# Patient Record
Sex: Male | Born: 1975 | Hispanic: No | Marital: Single | Smoking: Former smoker
Health system: Southern US, Community
[De-identification: ages and names within clinical notes are randomized; demographics above are authoritative.]

## PROBLEM LIST (undated history)

## (undated) DIAGNOSIS — C801 Malignant (primary) neoplasm, unspecified: Secondary | ICD-10-CM

---

## 2008-05-07 ENCOUNTER — Emergency Department (HOSPITAL_COMMUNITY): Admission: EM | Admit: 2008-05-07 | Discharge: 2008-05-07 | Payer: Self-pay | Admitting: Emergency Medicine

## 2011-06-02 LAB — RAPID URINE DRUG SCREEN, HOSP PERFORMED
Amphetamines: NOT DETECTED
Benzodiazepines: NOT DETECTED
Cocaine: NOT DETECTED
Opiates: NOT DETECTED
Tetrahydrocannabinol: NOT DETECTED

## 2011-06-02 LAB — URINALYSIS, ROUTINE W REFLEX MICROSCOPIC
Bilirubin Urine: NEGATIVE
Glucose, UA: NEGATIVE
Hgb urine dipstick: NEGATIVE
Ketones, ur: NEGATIVE
Nitrite: NEGATIVE
Specific Gravity, Urine: 1.004 — ABNORMAL LOW
pH: 6

## 2011-06-02 LAB — ETHANOL
Alcohol, Ethyl (B): 412
Alcohol, Ethyl (B): 99 — ABNORMAL HIGH

## 2011-06-02 LAB — COMPREHENSIVE METABOLIC PANEL
ALT: 26
Albumin: 4.1
Alkaline Phosphatase: 89
Calcium: 8.6
GFR calc Af Amer: >60
Glucose, Bld: 128 — ABNORMAL HIGH
Potassium: 3.8
Sodium: 140
Total Protein: 7.4

## 2011-06-02 LAB — CBC
MCHC: 34
Platelets: 141 — ABNORMAL LOW
RDW: 13

## 2011-06-02 LAB — DIFFERENTIAL
Basophils Relative: 0
Eosinophils Absolute: 0.1
Lymphs Abs: 1.4
Monocytes Absolute: 0.4
Monocytes Relative: 6
Neutro Abs: 5.2
Neutrophils Relative %: 73

## 2016-01-25 ENCOUNTER — Emergency Department (HOSPITAL_COMMUNITY)
Admission: EM | Admit: 2016-01-25 | Discharge: 2016-01-26 | Disposition: A | Discharge status: Home / Self Care | Payer: Self-pay | Attending: Emergency Medicine | Admitting: Emergency Medicine

## 2016-01-25 ENCOUNTER — Emergency Department (HOSPITAL_COMMUNITY): Payer: Self-pay

## 2016-01-25 ENCOUNTER — Encounter (HOSPITAL_COMMUNITY): Payer: Self-pay

## 2016-01-25 DIAGNOSIS — F10920 Alcohol use, unspecified with intoxication, uncomplicated: Secondary | ICD-10-CM

## 2016-01-25 DIAGNOSIS — Y998 Other external cause status: Secondary | ICD-10-CM | POA: Insufficient documentation

## 2016-01-25 DIAGNOSIS — Y92410 Unspecified street and highway as the place of occurrence of the external cause: Secondary | ICD-10-CM | POA: Insufficient documentation

## 2016-01-25 DIAGNOSIS — F10120 Alcohol abuse with intoxication, uncomplicated: Secondary | ICD-10-CM | POA: Insufficient documentation

## 2016-01-25 DIAGNOSIS — Y9389 Activity, other specified: Secondary | ICD-10-CM | POA: Insufficient documentation

## 2016-01-25 DIAGNOSIS — S3992XA Unspecified injury of lower back, initial encounter: Secondary | ICD-10-CM | POA: Insufficient documentation

## 2016-01-25 LAB — I-STAT CHEM 8, ED
BUN: 6 mg/dL (ref 6–20)
CREATININE: 0.9 mg/dL (ref 0.61–1.24)
Calcium, Ion: 1.08 mmol/L — ABNORMAL LOW (ref 1.12–1.23)
Chloride: 107 mmol/L (ref 101–111)
Glucose, Bld: 96 mg/dL (ref 65–99)
HEMATOCRIT: 43 % (ref 39.0–52.0)
Hemoglobin: 14.6 g/dL (ref 13.0–17.0)
POTASSIUM: 3.7 mmol/L (ref 3.5–5.1)
Sodium: 147 mmol/L — ABNORMAL HIGH (ref 135–145)
TCO2: 26 mmol/L (ref 0–100)
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
## 2016-01-25 NOTE — ED Notes (Signed)
Pt calling out from room, cursing. RN arrived to room and pt was urinating on floor. Pt removed all vital monitoring devices and c-collar. RN attempted to replace c-collar, pt continued to refuse. Pt states "I'm trying to go to sleep. It hurts too bad." RN again attempted to replace collar, pt continues to refuse.

## 2016-01-26 NOTE — ED Notes (Signed)
Patient still difficult to arouse.  Sleeping in bed but able to wake to verbal stimuli.  Will continue to monitor

## 2016-01-26 NOTE — Discharge Instructions (Signed)
Alcohol Abuse and Nutrition Alcohol abuse is any pattern of alcohol consumption that harms your health, relationships, or work. Alcohol abuse can affect how your body breaks down and absorbs nutrients from food by causing your liver to work abnormally. Additionally, many people who abuse alcohol do not eat enough carbohydrates, protein, fat, vitamins, and minerals. This can cause poor nutrition (malnutrition) and a lack of nutrients (nutrient deficiencies), which can lead to further complications. Nutrients that are commonly lacking (deficient) among people who abuse alcohol include:  Vitamins.  Vitamin A. This is stored in your liver. It is important for your vision, metabolism, and ability to fight off infections (immunity).  B vitamins. These include vitamins such as folate, thiamin, and niacin. These are important in new cell growth and maintenance.  Vitamin C. This plays an important role in iron absorption, wound healing, and immunity.  Vitamin D. This is produced by your liver, but you can also get vitamin D from food. Vitamin D is necessary for your body to absorb and use calcium.  Minerals.  Calcium. This is important for your bones and your heart and blood vessel (cardiovascular) function.  Iron. This is important for blood, muscle, and nervous system functioning.  Magnesium. This plays an important role in muscle and nerve function, and it helps to control blood sugar and blood pressure.  Zinc. This is important for the normal function of your nervous system and digestive system (gastrointestinal tract). Nutrition is an essential component of therapy for alcohol abuse. Your health care provider or dietitian will work with you to design a plan that can help restore nutrients to your body and prevent potential complications. WHAT IS MY PLAN? Your dietitian may develop a specific diet plan that is based on your condition and any other complications you may have. A diet plan will  commonly include:  A balanced diet.  Grains: 6-8 oz per day.  Vegetables: 2-3 cups per day.  Fruits: 1-2 cups per day.  Meat and other protein: 5-6 oz per day.  Dairy: 2-3 cups per day.  Vitamin and mineral supplements. WHAT DO I NEED TO KNOW ABOUT ALCOHOL AND NUTRITION?  Consume foods that are high in antioxidants, such as grapes, berries, nuts, green tea, and dark green and orange vegetables. This can help to counteract some of the stress that is placed on your liver by consuming alcohol.  Avoid food and drinks that are high in fat and sugar. Foods such as sugared soft drinks, salty snack foods, and candy contain empty calories. This means that they lack important nutrients such as protein, fiber, and vitamins.  Eat frequent meals and snacks. Try to eat 5-6 small meals each day.  Eat a variety of fresh fruits and vegetables each day. This will help you get plenty of water, fiber, and vitamins in your diet.  Drink plenty of water and other clear fluids. Try to drink at least 48-64 oz (1.5-2 L) of water per day.  If you are a vegetarian, eat a variety of protein-rich foods. Pair whole grains with plant-based proteins at meals and snacks to obtain the greatest nutrient benefit from your food. For example, eat rice with beans, put peanut butter on whole-grain toast, or eat oatmeal with sunflower seeds.  Soak beans and whole grains overnight before cooking. This can help your body to absorb the nutrients more easily.  Include foods fortified with vitamins and minerals in your diet. Commonly fortified foods include milk, orange juice, cereal, and bread.  If you  are malnourished, your dietitian may recommend a high-protein, high-calorie diet. This may include:  2,000-3,000 calories (kilocalories) per day.  70-100 grams of protein per day.  Your health care provider may recommend a complete nutritional supplement beverage. This can help to restore calories, protein, and vitamins to  your body. Depending on your condition, you may be advised to consume this instead of or in addition to meals.  Limit your intake of caffeine. Replace drinks like coffee and black tea with decaffeinated coffee and herbal tea.  Eat a variety of foods that are high in omega fatty acids. These include fish, nuts and seeds, and soybeans. These foods may help your liver to recover and may also stabilize your mood.  Certain medicines may cause changes in your appetite, taste, and weight. Work with your health care provider and dietitian to make any adjustments to your medicines and diet plan.  Include other healthy lifestyle choices in your daily routine.  Be physically active.  Get enough sleep.  Spend time doing activities that you enjoy.  If you are unable to take in enough food and calories by mouth, your health care provider may recommend a feeding tube. This is a tube that passes through your nose and throat, directly into your stomach. Nutritional supplement beverages can be given to you through the feeding tube to help you get the nutrients you need.  Take vitamin or mineral supplements as recommended by your health care provider. WHAT FOODS CAN I EAT? Grains Enriched pasta. Enriched rice. Fortified whole-grain bread. Fortified whole-grain cereal. Barley. Brown rice. Quinoa. [place]. Vegetables All fresh, frozen, and canned vegetables. Spinach. Kale. Artichoke. Carrots. Winter squash and pumpkin. Sweet potatoes. Broccoli. Cabbage. Cucumbers. Tomatoes. Sweet peppers. Green beans. Peas. Corn. Fruits All fresh and frozen fruits. Berries. Grapes. Mango. Papaya. Guava. Cherries. Apples. Bananas. Peaches. Plums. Pineapple. Watermelon. Cantaloupe. Oranges. Avocado. Meats and Other Protein Sources Beef liver. Lean beef. Pork. Fresh and canned chicken. Fresh fish. Oysters. Sardines. Canned tuna. Shrimp. Eggs with yolks. Nuts and seeds. Peanut butter. Beans and lentils. Soybeans.  Tofu. Dairy Whole, low-fat, and nonfat milk. Whole, low-fat, and nonfat yogurt. Cottage cheese. Sour cream. Hard and soft cheeses. Beverages Water. Herbal tea. Decaffeinated coffee. Decaffeinated green tea. 100% fruit juice. 100% vegetable juice. Instant breakfast shakes. Condiments Ketchup. Mayonnaise. Mustard. Salad dressing. Barbecue sauce. Sweets and Desserts Sugar-free ice cream. Sugar-free pudding. Sugar-free gelatin. Fats and Oils Butter. Vegetable oil, flaxseed oil, olive oil, and walnut oil. Other Complete nutrition shakes. Protein bars. Sugar-free gum. The items listed above may not be a complete list of recommended foods or beverages. Contact your dietitian for more options. WHAT FOODS ARE NOT RECOMMENDED? Grains Sugar-sweetened breakfast cereals. Flavored instant oatmeal. Fried breads. Vegetables Breaded or deep-fried vegetables. Fruits Dried fruit with added sugar. Candied fruit. Canned fruit in syrup. Meats and Other Protein Sources Breaded or deep-fried meats. Dairy Flavored milks. Fried cheese curds or fried cheese sticks. Beverages Alcohol. Sugar-sweetened soft drinks. Sugar-sweetened tea. Caffeinated coffee and tea. Condiments Sugar. Honey. Agave nectar. Molasses. Sweets and Desserts Chocolate. Cake. Cookies. Candy. Other Potato chips. Pretzels. Salted nuts. Candied nuts. The items listed above may not be a complete list of foods and beverages to avoid. Contact your dietitian for more information.   This information is not intended to replace advice given to you by your health care provider. Make sure you discuss any questions you have with your health care provider.   Document Released: 06/10/2005 Document Revised: 09/06/2014 Document Reviewed: 03/19/2014 Elsevier Interactive Patient  Education 2016 [place] Dependency Chemical dependency is an addiction to drugs or alcohol. It is characterized by the repeated behavior of seeking out and  using drugs and alcohol despite harmful consequences to the health and safety of ones self and others.  RISK FACTORS There are certain situations or behaviors that increase a person's risk for chemical dependency. These include:  A family history of chemical dependency.  A history of mental health issues, including depression and anxiety.  A home environment where drugs and alcohol are easily available to you.  Drug or alcohol use at a young age. SYMPTOMS  The following symptoms can indicate chemical dependency:  Inability to limit the use of drugs or alcohol.  Nausea, sweating, shakiness, and anxiety that occurs when alcohol or drugs are not being used.  An increase in amount of drugs or alcohol that is necessary to get drunk or high. People who experience these symptoms can assess their use of drugs and alcohol by asking themselves the following questions:  Have you been told by friends or family that they are worried about your use of alcohol or drugs?  Do friends and family ever tell you about things you did while drinking alcohol or using drugs that you do not remember?  Do you lie about using alcohol or drugs or about the amounts you use?  Do you have difficulty completing daily tasks unless you use alcohol or drugs?  Is the level of your work or school performance lower because of your drug or alcohol use?  Do you get sick from using drugs or alcohol but keep using anyway?  Do you feel uncomfortable in social situations unless you use alcohol or drugs?  Do you use drugs or alcohol to help forget problems? An answer of yes to any of these questions may indicate chemical dependency. Professional evaluation is suggested.   This information is not intended to replace advice given to you by your health care provider. Make sure you discuss any questions you have with your health care provider.   Document Released: 08/10/2001 Document Revised: 11/08/2011 Document Reviewed:  10/22/2010 Elsevier Interactive Patient Education 2016 [employer].  Alcohol Use Disorder Alcohol use disorder is a mental disorder. It is not a one-time incident of heavy drinking. Alcohol use disorder is the excessive and uncontrollable use of alcohol over time that leads to problems with functioning in one or more areas of daily living. People with this disorder risk harming themselves and others when they drink to excess. Alcohol use disorder also can cause other mental disorders, such as mood and anxiety disorders, and serious physical problems. People with alcohol use disorder often misuse other drugs.  Alcohol use disorder is common and widespread. Some people with this disorder drink alcohol to cope with or escape from negative life events. Others drink to relieve chronic pain or symptoms of mental illness. People with a family history of alcohol use disorder are at higher risk of losing control and using alcohol to excess.  Drinking too much alcohol can cause injury, accidents, and health problems. One drink can be too much when you are:  Working.  Pregnant or breastfeeding.  Taking medicines. Ask your doctor.  Driving or planning to drive. SYMPTOMS  Signs and symptoms of alcohol use disorder may include the following:   Consumption ofalcohol inlarger amounts or over a longer period of time than intended.  Multiple unsuccessful attempts to cutdown or control alcohol use.   A great deal of time  spent obtaining alcohol, using alcohol, or recovering from the effects of alcohol (hangover).  A Malizia desire or urge to use alcohol (cravings).   Continued use of alcohol despite problems at work, school, or home because of alcohol use.   Continued use of alcohol despite problems in relationships because of alcohol use.  Continued use of alcohol in situations when it is physically hazardous, such as driving a car.  Continued use of alcohol despite awareness of a physical or  psychological problem that is likely related to alcohol use. Physical problems related to alcohol use can involve the brain, heart, liver, stomach, and intestines. Psychological problems related to alcohol use include intoxication, depression, anxiety, psychosis, delirium, and dementia.   The need for increased amounts of alcohol to achieve the same desired effect, or a decreased effect from the consumption of the same amount of alcohol (tolerance).  Withdrawal symptoms upon reducing or stopping alcohol use, or alcohol use to reduce or avoid withdrawal symptoms. Withdrawal symptoms include:  Racing heart.  Hand tremor.  Difficulty sleeping.  Nausea.  Vomiting.  Hallucinations.  Restlessness.  Seizures. DIAGNOSIS Alcohol use disorder is diagnosed through an assessment by your health care provider. Your health care provider may start by asking three or four questions to screen for excessive or problematic alcohol use. To confirm a diagnosis of alcohol use disorder, at least two symptoms must be present within a 30-month period. The severity of alcohol use disorder depends on the number of symptoms:  Mild--two or three.  Moderate--four or five.  Severe--six or more. Your health care provider may perform a physical exam or use results from lab tests to see if you have physical problems resulting from alcohol use. Your health care provider may refer you to a mental health professional for evaluation. TREATMENT  Some people with alcohol use disorder are able to reduce their alcohol use to low-risk levels. Some people with alcohol use disorder need to quit drinking alcohol. When necessary, mental health professionals with specialized training in substance use treatment can help. Your health care provider can help you decide how severe your alcohol use disorder is and what type of treatment you need. The following forms of treatment are available:   Detoxification. Detoxification involves  the use of prescription medicines to prevent alcohol withdrawal symptoms in the first week after quitting. This is important for people with a history of symptoms of withdrawal and for heavy drinkers who are likely to have withdrawal symptoms. Alcohol withdrawal can be dangerous and, in severe cases, cause death. Detoxification is usually provided in a hospital or in-patient substance use treatment facility.  Counseling or talk therapy. Talk therapy is provided by substance use treatment counselors. It addresses the reasons people use alcohol and ways to keep them from drinking again. The goals of talk therapy are to help people with alcohol use disorder find healthy activities and ways to cope with life stress, to identify and avoid triggers for alcohol use, and to handle cravings, which can cause relapse.  Medicines.Different medicines can help treat alcohol use disorder through the following actions:  Decrease alcohol cravings.  Decrease the positive reward response felt from alcohol use.  Produce an uncomfortable physical reaction when alcohol is used (aversion therapy).  Support groups. Support groups are run by people who have quit drinking. They provide emotional support, advice, and guidance. These forms of treatment are often combined. Some people with alcohol use disorder benefit from intensive combination treatment provided by specialized substance use treatment centers.  Both inpatient and outpatient treatment programs are available.   This information is not intended to replace advice given to you by your health care provider. Make sure you discuss any questions you have with your health care provider.   Document Released: 09/23/2004 Document Revised: 09/06/2014 Document Reviewed: 11/23/2012 Elsevier Interactive Patient Education 2016 [employer]. Technical brewer It is common to have multiple bruises and sore muscles after a motor vehicle collision (MVC). These tend to feel  worse for the first 24 hours. You may have the most stiffness and soreness over the first several hours. You may also feel worse when you wake up the first morning after your collision. After this point, you will usually begin to improve with each day. The speed of improvement often depends on the severity of the collision, the number of injuries, and the location and nature of these injuries. HOME CARE INSTRUCTIONS  Put ice on the injured area.  Put ice in a plastic bag.  Place a towel between your skin and the bag.  Leave the ice on for 15-20 minutes, 3-4 times a day, or as directed by your health care provider.  Drink enough fluids to keep your urine clear or pale yellow. Do not drink alcohol.  Take a warm shower or bath once or twice a day. This will increase blood flow to sore muscles.  You may return to activities as directed by your caregiver. Be careful when lifting, as this may aggravate neck or back pain.  Only take over-the-counter or prescription medicines for pain, discomfort, or fever as directed by your caregiver. Do not use aspirin. This may increase bruising and bleeding. SEEK IMMEDIATE MEDICAL CARE IF:  You have numbness, tingling, or weakness in the arms or legs.  You develop severe headaches not relieved with medicine.  You have severe neck pain, especially tenderness in the middle of the back of your neck.  You have changes in bowel or bladder control.  There is increasing pain in any area of the body.  You have shortness of breath, light-headedness, dizziness, or fainting.  You have chest pain.  You feel sick to your stomach (nauseous), throw up (vomit), or sweat.  You have increasing abdominal discomfort.  There is blood in your urine, stool, or vomit.  You have pain in your shoulder (shoulder strap areas).  You feel your symptoms are getting worse. MAKE SURE YOU:  Understand these instructions.  Will watch your condition.  Will get help right  away if you are not doing well or get worse.   This information is not intended to replace advice given to you by your health care provider. Make sure you discuss any questions you have with your health care provider.   Document Released: 08/16/2005 Document Revised: 09/06/2014 Document Reviewed: 01/13/2011 Elsevier Interactive Patient Education [employer].

## 2016-01-26 NOTE — ED Notes (Signed)
This RN woke up patient to get him ready for discharge. Pt able to sit up, stand on feet steadily and ambulated to wheelchair steadily and independently. Pt was able to answer questions appropriately and clearly. Pt wheeled to waiting room to call for a ride home. Pt A&OX4.
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
## 2019-05-08 ENCOUNTER — Emergency Department (HOSPITAL_COMMUNITY)
Admission: EM | Admit: 2019-05-08 | Discharge: 2019-05-08 | Disposition: A | Discharge status: Home / Self Care | Payer: Self-pay | Attending: Emergency Medicine | Admitting: Emergency Medicine

## 2019-05-08 ENCOUNTER — Emergency Department (HOSPITAL_COMMUNITY): Payer: Self-pay

## 2019-05-08 ENCOUNTER — Other Ambulatory Visit: Payer: Self-pay

## 2019-05-08 ENCOUNTER — Encounter (HOSPITAL_COMMUNITY): Payer: Self-pay | Admitting: Emergency Medicine

## 2019-05-08 ENCOUNTER — Emergency Department (HOSPITAL_COMMUNITY)
Admission: EM | Admit: 2019-05-08 | Discharge: 2019-05-09 | Disposition: A | Discharge status: Home / Self Care | Payer: Self-pay | Attending: Emergency Medicine | Admitting: Emergency Medicine

## 2019-05-08 DIAGNOSIS — F10929 Alcohol use, unspecified with intoxication, unspecified: Secondary | ICD-10-CM | POA: Insufficient documentation

## 2019-05-08 DIAGNOSIS — F17210 Nicotine dependence, cigarettes, uncomplicated: Secondary | ICD-10-CM | POA: Insufficient documentation

## 2019-05-08 DIAGNOSIS — F10920 Alcohol use, unspecified with intoxication, uncomplicated: Secondary | ICD-10-CM

## 2019-05-08 DIAGNOSIS — R4182 Altered mental status, unspecified: Secondary | ICD-10-CM | POA: Insufficient documentation

## 2019-05-08 DIAGNOSIS — R110 Nausea: Secondary | ICD-10-CM | POA: Insufficient documentation

## 2019-05-08 MED ORDER — ONDANSETRON 8 MG PO TBDP: ORAL_TABLET | ORAL | 0 refills | Status: DC

## 2019-05-08 MED ORDER — ONDANSETRON 8 MG PO TBDP
8.0000 mg | ORAL_TABLET | Freq: Once | ORAL | Status: AC
  Administered 2019-05-08: 8 mg via ORAL
  Filled 2019-05-08: qty 1

## 2019-05-08 NOTE — ED Notes (Signed)
Pt refusing to allow RN to obtain blood for labs. Pearlie Oyster, PA aware.

## 2019-05-08 NOTE — ED Provider Notes (Signed)
[facility] DEPT Provider Note   CSN: [id number] Arrival date & time: 05/08/19  0219     History   Chief Complaint Chief Complaint  Patient presents with  . Nausea    HPI Gregory Snyder is a 43 y.o. male.     The history is provided by the patient.  Illness Location:  Started at home after eating a bologna sandwich Quality:  Nauseated Severity:  Moderate Onset quality:  Sudden Duration:  2 hours Timing:  Constant Progression:  Unchanged Chronicity:  New Context:  Eating a bologna sandwich Relieved by:  Nothing Worsened by:  Nothing Ineffective treatments:  None tried Associated symptoms: nausea   Associated symptoms: no abdominal pain, no chest pain, no congestion, no cough, no diarrhea, no ear pain, no fatigue, no fever, no headaches, no loss of consciousness, no myalgias, no rash, no rhinorrhea, no shortness of breath, no sore throat, no vomiting and no wheezing   Nausea:    Severity:  Moderate   Onset quality:  Sudden   Duration:  2 hours   Timing:  Constant   Progression:  Unchanged   History reviewed. No pertinent past medical history.  There are no active problems to display for this patient.   History reviewed. No pertinent surgical history.      Home Medications    Prior to Admission medications   Medication Sig Start Date End Date Taking? Authorizing Provider  ondansetron (ZOFRAN ODT) 8 MG disintegrating tablet 8mg  ODT q8 hours prn nausea 05/08/19   Goro Wenrick, MD    Family History History reviewed. No pertinent family history.  Social History Social History   Tobacco Use  . Smoking status: Heavy Tobacco Smoker    Packs/day: 1.00    Types: Cigarettes  . Smokeless tobacco: Never Used  Substance Use Topics  . Alcohol use: Yes  . Drug use: No     Allergies   Ibuprofen   Review of Systems Review of Systems  Constitutional: Negative for fatigue and fever.  HENT: Negative for congestion, ear pain,  rhinorrhea and sore throat.   Eyes: Negative for visual disturbance.  Respiratory: Negative for cough, shortness of breath and wheezing.   Cardiovascular: Negative for chest pain.  Gastrointestinal: Positive for nausea. Negative for abdominal pain, constipation, diarrhea and vomiting.  Genitourinary: Negative for dysuria and flank pain.  Musculoskeletal: Negative for myalgias.  Skin: Negative for rash.  Neurological: Negative for loss of consciousness and headaches.  Psychiatric/Behavioral: Negative for agitation.  All other systems reviewed and are negative.    Physical Exam Updated Vital Signs BP (!) 142/96 (BP Location: Right Arm)   Pulse 77   Temp 98.4 F (36.9 C) (Oral)   Resp 18   Ht 5\' 9"  (1.753 m)   Wt 90.7 kg   SpO2 100%   BMI 29.53 kg/m   Physical Exam Vitals signs and nursing note reviewed.  Constitutional:      General: He is not in acute distress.    Appearance: He is normal weight.  HENT:     Head: Normocephalic and atraumatic.     Nose: Nose normal.     Mouth/Throat:     Mouth: Mucous membranes are moist.     Pharynx: Oropharynx is clear.  Eyes:     Conjunctiva/sclera: Conjunctivae normal.     Pupils: Pupils are equal, round, and reactive to light.  Neck:     Musculoskeletal: Normal range of motion and neck supple.  Cardiovascular:  Rate and Rhythm: Normal rate and regular rhythm.     Pulses: Normal pulses.     Heart sounds: Normal heart sounds.  Pulmonary:     Effort: Pulmonary effort is normal.     Breath sounds: Normal breath sounds.  Abdominal:     General: Abdomen is flat. Bowel sounds are normal.     Tenderness: There is no abdominal tenderness. There is no guarding or rebound.  Musculoskeletal: Normal range of motion.  Skin:    General: Skin is warm.     Capillary Refill: Capillary refill takes less than 2 seconds.  Neurological:     General: No focal deficit present.     Mental Status: He is alert and oriented to person, place, and  time.  Psychiatric:        Mood and Affect: Mood normal.        Behavior: Behavior normal.      ED Treatments / Results  Labs (all labs ordered are listed, but only abnormal results are displayed) Labs Reviewed - No data to display  EKG None  Radiology No results found.  Procedures Procedures (including critical care time)  Medications Ordered in ED Medications  ondansetron (ZOFRAN-ODT) disintegrating tablet 8 mg (8 mg Oral Given 05/08/19 0356)     Symptoms resolved post medication.  PO challenged successfully.  No f/c/r. No abdominal pain nor tenderness. Suspect sandwich as the cause of symptoms.  Counseled on eating a bland diet.  No indication for labs or imaging at this time.   Gregory Snyder was evaluated in Emergency Department on 05/08/2019 for the symptoms described in the history of present illness. He was evaluated in the context of the global COVID-19 pandemic, which necessitated consideration that the patient might be at risk for infection with the SARS-CoV-2 virus that causes COVID-19. Institutional protocols and algorithms that pertain to the evaluation of patients at risk for COVID-19 are in a state of rapid change based on information released by regulatory bodies including the CDC and federal and state organizations. These policies and algorithms were followed during the patient's care in the ED.   Final Clinical Impressions(s) / ED Diagnoses   Final diagnoses:  Nausea   Return for intractable cough, coughing up blood,fevers >100.4 unrelieved by medication, shortness of breath, intractable vomiting, chest pain, shortness of breath, weakness,numbness, changes in speech, facial asymmetry,abdominal pain, passing out,Inability to tolerate liquids or food, cough, altered mental status or any concerns. No signs of systemic illness or infection. The patient is nontoxic-appearing on exam and vital signs are within normal limits.   I have reviewed the triage vital  signs and the nursing notes. Pertinent labs &imaging results that were available during my care of the patient were reviewed by me and considered in my medical decision making (see chart for details). After history, exam, and medical workup I feel the patient has beenappropriately medically screened and is safe for discharge home. Pertinent diagnoses were discussed with the patient. Patient was givenreturn precautions ED Discharge Orders         Ordered    ondansetron (ZOFRAN ODT) 8 MG disintegrating tablet     05/08/19 0416           Judieth Mckown, MD 05/08/19 [id number]

## 2019-05-08 NOTE — ED Triage Notes (Signed)
Pt presents by [facility] for evaluation of nausea without vomiting or diarrhea that started at 8pm on 05/07/2019.

## 2019-05-08 NOTE — ED Triage Notes (Signed)
Pt BIB EMS from [place]. Pt intoxicated. Pt has no complaints at this time.

## 2019-05-08 NOTE — ED Notes (Signed)
Pt would not cooperate to have CT scan

## 2019-05-08 NOTE — ED Provider Notes (Signed)
[facility] DEPT Provider Note   CSN: [id number] Arrival date & time: 05/08/19  2115     History   Chief Complaint Chief Complaint  Patient presents with  . Alcohol Intoxication    HPI Gregory Snyder is a 43 y.o. male.     The history is provided by medical records. The history is limited by the condition of the patient. No language interpreter was used.  Alcohol Intoxication   Gregory Snyder is a 43 y.o. male  who presents to the Emergency Department brought in by EMS from downtown [place] reportedly intoxicated.  I tried to evaluate patient, but he just kept rolling over and putting a blanket over his head and would not talk to me.  Unable to gather any history from patient at this time.  Level V caveat applies 2/2 altered mental status   No past medical history on file.  There are no active problems to display for this patient.   No past surgical history on file.      Home Medications    Prior to Admission medications   Medication Sig Start Date End Date Taking? Authorizing Provider  ondansetron (ZOFRAN ODT) 8 MG disintegrating tablet 8mg  ODT q8 hours prn nausea 05/08/19   Palumbo, April, MD    Family History No family history on file.  Social History Social History   Tobacco Use  . Smoking status: Heavy Tobacco Smoker    Packs/day: 1.00    Types: Cigarettes  . Smokeless tobacco: Never Used  Substance Use Topics  . Alcohol use: Yes  . Drug use: No     Allergies   Ibuprofen   Review of Systems Review of Systems  Unable to perform ROS: Mental status change     Physical Exam Updated Vital Signs BP (!) 119/55   Pulse 70   Resp 15   SpO2 95%   Physical Exam Vitals signs and nursing note reviewed.  Constitutional:      General: He is not in acute distress.    Appearance: He is well-developed.  HENT:     Head: Normocephalic and atraumatic.  Neck:     Musculoskeletal: Neck supple.  Cardiovascular:     Rate  and Rhythm: Normal rate and regular rhythm.     Heart sounds: Normal heart sounds. No murmur.  Pulmonary:     Effort: Pulmonary effort is normal. No respiratory distress.     Breath sounds: Normal breath sounds.  Abdominal:     General: There is no distension.     Palpations: Abdomen is soft.     Tenderness: There is no abdominal tenderness.  Skin:    General: Skin is warm and dry.  Neurological:     Mental Status: He is alert.     Comments: Will not cooperate with testing. Moves all extremities independently.       ED Treatments / Results  Labs (all labs ordered are listed, but only abnormal results are displayed) Labs Reviewed  COMPREHENSIVE METABOLIC PANEL  CBC WITH DIFFERENTIAL/PLATELET  ETHANOL  SALICYLATE LEVEL  ACETAMINOPHEN LEVEL    EKG None  Radiology No results found.  Procedures Procedures (including critical care time)  Medications Ordered in ED Medications - No data to display   Initial Impression / Assessment and Plan / ED Course  I have reviewed the triage vital signs and the nursing notes.  Pertinent labs & imaging results that were available during my care of the patient were reviewed by me and considered  in my medical decision making (see chart for details).       [name] is a 43 y.o. male was brought to ED presumably intoxicated after being found in downtown [place]. Patient not contributing to history. CT head negative. Labs pending. Care assumed by oncoming provider, PA Upstill who will follow up on labs and dispo appropriately.    Final Clinical Impressions(s) / ED Diagnoses   Final diagnoses:  None    ED Discharge Orders    None       Ward, Ozella Almond, PA-C 05/09/19 [id number]    Veryl Speak, MD 05/11/19 2304

## 2019-05-08 NOTE — ED Notes (Signed)
PT given water for PO challenge.

## 2019-05-08 NOTE — ED Notes (Signed)
ED Provider at bedside. 

## 2019-05-08 NOTE — ED Notes (Signed)
Attempted to move patient to CT table for CT scan. Pt refused to lay on his back or let us move him in any way. Pt taken back to room. PA Jamie aware.

## 2019-05-08 NOTE — ED Notes (Signed)
Pt tolerate PO water. States that he feels much better.

## 2019-05-09 ENCOUNTER — Emergency Department (HOSPITAL_COMMUNITY): Payer: Self-pay

## 2019-05-09 NOTE — ED Notes (Signed)
Pt refusing to allow this writer to update his vital signs.

## 2019-05-09 NOTE — ED Notes (Signed)
Pt refused discharge vital signs

## 2019-05-09 NOTE — ED Provider Notes (Signed)
Patient care signed out by [facility], PA-C at end of shift. Patient presented by EMS from downtown [place] with reported intoxication. History of same. Head CT negative for other concerning causes of altered mental status.   VSS. He refuses to answer questions. Plan is to observe and allow him to sober.   He has been sleeping soundly. Responds to verbal stimuli on rechecks through the night. He continues to refuse blood draw.   6:45 - patient wakes easily, responds politely. He denies SI/HI/AVH or any attempt at self harm last night. He states he will accept resources for alcohol treatment programs. He can be discharged home.      Charlann Lange, PA-C 05/09/19 [id number]    Veryl Speak, MD 05/11/19 [phone]

## 2019-05-09 NOTE — Progress Notes (Addendum)
Patient refusing lab draws. Notified PA

## 2019-05-09 NOTE — ED Notes (Signed)
Pt is refusing blood draw. Pt yanked his arm away from this writer while attempting to drawl blood. Pt stated "That's not a fucking vein. You're not going to get it and I'm not going to let you sit here and dig around in my arm." Nehemiah Settle, PA made aware.

## 2020-12-28 HISTORY — PX: TOTAL LARYNGECTOMY: SHX2543

## 2021-02-20 ENCOUNTER — Other Ambulatory Visit: Payer: Self-pay | Admitting: Radiation Oncology

## 2021-02-20 ENCOUNTER — Ambulatory Visit
Admission: RE | Admit: 2021-02-20 | Discharge: 2021-02-20 | Disposition: A | Discharge status: Home / Self Care | Payer: Self-pay | Source: Ambulatory Visit | Attending: Radiation Oncology | Admitting: Radiation Oncology

## 2021-02-20 DIAGNOSIS — C760 Malignant neoplasm of head, face and neck: Secondary | ICD-10-CM

## 2021-04-29 NOTE — Progress Notes (Deleted)
[name] CONSULT NOTE  Patient Care Team: System, Provider Not In as PCP - General  CHIEF COMPLAINTS/PURPOSE OF CONSULTATION:  ***  ASSESSMENT & PLAN:  No problem-specific Assessment & Plan notes found for this encounter.  No orders of the defined types were placed in this encounter.    HISTORY OF PRESENTING ILLNESS:  Gregory Snyder 45 y.o. 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  Given extracapsular extension and close margins he was treated with adjuvant chemoradiation with cisplatin.  Cycle 1 day 1 was March 04, 2021.  He received 60 Gy years of IMRT and cisplatin dose was reduced to 30 mics per metered squared during week 4. He was followed by Gregory Snyder from [facility]. He appears to have some cancer related pain treated with fentanyl patch and as needed oxycodone, chemo induced nausea and vomiting and some constipation.  Oncology history.  This is a 45 year old man with history of hepatitis C who presents for evaluation of head and neck cancer.  He initially presented with 35-monthhistory of worsening dysphagia and sore throat.  He had a CT neck performed in February 2022 which revealed an irregular soft tissue mass extending centered in the right supraglottic larynx with involvement of right tonsillar pillar epiglottis true vocal cord right thyroid cartilage with extension to the posterior neck strap muscles on the right.  There is also concern for cervical metastatic disease.  He underwent a trach and G-tube placement on October 08, 2020 with intraoperative biopsy.  Final pathology showed invasive squamous cell carcinoma.  He had a PET/CT performed on April 2022 which showed interval enlargement of the known mass with concerns for cervical metastasis.  He was  recommended primary surgery followed by adjuvant CRT.  He is prior tobacco user, 45-pack-year history, quit in 2021.  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  LARYNX (SUPRAGLOTTIS, GLOTTIS, SUBGLOTTIS) 8th Edition - Protocol posted: 08/15/2020 LARYNX (SUPRAGLOTTIS, GLOTTIS, SUBGLOTTIS) - All Specimens SPECIMEN  Procedure Total laryngectomy  TUMOR  Tumor Focality Unifocal  Tumor Site Larynx, glottis  Tumor Subsite True vocal cord  Subglottic Extension Not identified  Transglottic Extension Present  Tumor Laterality Right  Tumor Size Greatest Dimension (Centimeters): 6.0 cm  Histologic Type Squamous cell carcinoma, conventional (keratinizing)  Histologic Grade G2, moderately differentiated  Lymphovascular Invasion Not identified  Perineural Invasion Not identified  MARGINS  Margin Status for Invasive Tumor All margins negative for invasive tumor  Distance from Invasive Tumor to Closest Margin Less than 1 mm  Closest Margin(s) to Invasive Tumor right pharynx  Margin Status for Noninvasive Tumor All margins negative for noninvasive tumor  Distance from Noninvasive Tumor to Closest Margin Less than 1 mm  Closest Margin(s) to Noninvasive Tumor right pharynx  REGIONAL LYMPH NODES  Regional Lymph Node Status Tumor present in regional lymph node(s)  Number of Lymph Nodes with Tumor 6  Laterality of Lymph Node(s) with Tumor Ipsilateral (including midline)  Size of Largest Nodal Metastatic Deposit 1.6 cm  Extranodal Extension (ENE) Present  Distance of ENE from Lymph Node Capsule Greater than 2 mm (macroscopic ENE)  Number of Lymph Nodes Examined 77  PATHOLOGIC STAGE CLASSIFICATION (pTNM, AJCC 8th Edition)  Reporting of pT, pN, and (when applicable) pM categories is based on information available to the pathologist  at the time the report is issued. As per the AJCC (Chapter 1, 8th Ed.) it is the managing physician's responsibility to establish the final pathologic stage  based upon all pertinent information, including but potentially not limited to this pathology report.    [id number]  He is establishing with medical oncology locally for follow-up.    MEDICAL HISTORY:  No past medical history on file.  SURGICAL HISTORY: No past surgical history on file.  SOCIAL HISTORY: Social History   Socioeconomic History   Marital status: Single    Spouse name: Not on file   Number of children: Not on file   Years of education: Not on file   Highest education level: Not on file  Occupational History   Not on file  Tobacco Use   Smoking status: Heavy Smoker    Packs/day: 1.00    Types: Cigarettes   Smokeless tobacco: Never  Substance and Sexual Activity   Alcohol use: Yes   Drug use: No   Sexual activity: Not on file  Other Topics Concern   Not on file  Social History Narrative   Not on file   Social Determinants of Health   Financial Resource Strain: Not on file  Food Insecurity: Not on file  Transportation Needs: Not on file  Physical Activity: Not on file  Stress: Not on file  Social Connections: Not on file  Intimate Partner Violence: Not on file    FAMILY HISTORY: No family history on file.  ALLERGIES:  is allergic to ibuprofen.  MEDICATIONS:  Current Outpatient Medications  Medication Sig Dispense Refill   ondansetron (ZOFRAN ODT) 8 MG disintegrating tablet '8mg'$  ODT q8 hours prn nausea 4 tablet 0   No current facility-administered medications for this visit.   PHYSICAL EXAMINATION: ECOG PERFORMANCE STATUS: {CHL ONC ECOG PS:[phone]}  There were no vitals filed for this visit. There were no vitals filed for this visit.  GENERAL:alert, no distress and comfortable SKIN: skin color, texture, turgor are normal, no rashes or significant lesions EYES: normal, conjunctiva are pink and non-injected, sclera clear OROPHARYNX:no exudate, no erythema and lips, buccal mucosa, and tongue normal  NECK: supple, thyroid normal size,  non-tender, without nodularity LYMPH:  no palpable lymphadenopathy in the cervical, axillary or inguinal LUNGS: clear to auscultation and percussion with normal breathing effort HEART: regular rate & rhythm and no murmurs and no lower extremity edema ABDOMEN:abdomen soft, non-tender and normal bowel sounds Musculoskeletal:no cyanosis of digits and no clubbing  PSYCH: alert & oriented x 3 with fluent speech NEURO: no focal motor/sensory deficits  LABORATORY DATA:  I have reviewed the data as listed Lab Results  Component Value Date   WBC 7.1 05/07/2008   HGB 14.6 01/25/2016   HCT 43.0 01/25/2016   MCV 87.0 05/07/2008   PLT 141 (L) 05/07/2008     Chemistry      Component Value Date/Time   NA 147 (H) 01/25/2016 2355   K 3.7 01/25/2016 2355   CL 107 01/25/2016 2355   CO2 29 05/07/2008 1026   BUN 6 01/25/2016 2355   CREATININE 0.90 01/25/2016 2355      Component Value Date/Time   CALCIUM 8.6 05/07/2008 1026   ALKPHOS 89 05/07/2008 1026   AST 29 05/07/2008 1026   ALT 26 05/07/2008 1026   BILITOT 0.5 05/07/2008 1026       RADIOGRAPHIC STUDIES: I have personally reviewed the radiological images as listed and agreed with the findings in the report. No results found.  All  questions were answered. The patient knows to call the clinic with any problems, questions or concerns. I spent *** minutes in the care of this patient including H and P, review of records, counseling and coordination of care.     Benay Pike, MD 04/29/2021 1:15 PM

## 2021-04-30 ENCOUNTER — Inpatient Hospital Stay: Payer: Medicaid Other | Admitting: Hematology and Oncology

## 2021-04-30 ENCOUNTER — Telehealth: Payer: Self-pay | Admitting: Hematology and Oncology

## 2021-04-30 ENCOUNTER — Inpatient Hospital Stay: Payer: Medicaid Other

## 2021-04-30 NOTE — Telephone Encounter (Signed)
R/s new patient appt due to patient missing appt. Called, not able to leave msg. Will attempt to call again

## 2021-05-12 ENCOUNTER — Telehealth: Payer: Self-pay

## 2021-05-12 ENCOUNTER — Ambulatory Visit: Payer: Medicaid Other | Admitting: Hematology and Oncology

## 2021-05-12 ENCOUNTER — Other Ambulatory Visit: Payer: Medicaid Other

## 2021-05-12 NOTE — Telephone Encounter (Signed)
Attempted to call patient d/t missing new appointment with Dr Chryl Heck. No answer; unable to leave voicemail.

## 2021-12-28 ENCOUNTER — Emergency Department (HOSPITAL_COMMUNITY)
Admission: EM | Admit: 2021-12-28 | Discharge: 2021-12-28 | Disposition: A | Discharge status: Home / Self Care | Payer: Medicaid Other | Attending: Emergency Medicine | Admitting: Emergency Medicine

## 2021-12-28 DIAGNOSIS — J9503 Malfunction of tracheostomy stoma: Secondary | ICD-10-CM | POA: Insufficient documentation

## 2021-12-28 NOTE — ED Notes (Signed)
Patient to ER room 29 accompanied by law enforcement. Law enforcement reports patient was arrested today and could not be taken to jail d/t missing piece of tracheostomy. Patient reports he "lost" a portion of his trach equipment that involves his trach collar today. Patient reports he has no supplies with him. Patient reports history of laryngeal cancer. Patient is very soft spoken but is alert and oriented and cooperative. Law enforcement remains at bedside. Call bell in reach. ?

## 2021-12-28 NOTE — ED Triage Notes (Signed)
Pt w trach missing since earlier this evening. Airway intact, NAD in triage.  ?

## 2021-12-28 NOTE — ED Notes (Signed)
Physician at bedside and places 8 Shiley trach and collar.  ?

## 2021-12-28 NOTE — ED Provider Notes (Signed)
?[facility] ?Provider Note ? ? ?CSN: [id number] ?Arrival date & time: 12/28/21  2022 ? ?  ? ?History ? ?Chief Complaint  ?Patient presents with  ? Tracheostomy Tube Change  ? ? ?Gregory Snyder is a 46 y.o. male. ? ?46 yo M with a chief complaints of having his tracheostomy tube removed.  He has a history of trach dependence and had typically had a tube in place but took it out today.  He also has recently been arrested.  Is here with police as they were unable to place him into the prison without having his device in place. ? ? ? ?  ? ?Home Medications ?Prior to Admission medications   ?Medication Sig Start Date End Date Taking? Authorizing Provider  ?ondansetron (ZOFRAN ODT) 8 MG disintegrating tablet '8mg'$  ODT q8 hours prn nausea 05/08/19   Palumbo, April, MD  ?   ? ?Allergies    ?Ibuprofen   ? ?Review of Systems   ?Review of Systems ? ?Physical Exam ?Updated Vital Signs ?BP (!) 133/101   Pulse 74   Temp 97.8 ?F (36.6 ?C) (Oral)   Resp 18   SpO2 100%  ?Physical Exam ? ?ED Results / Procedures / Treatments   ?Labs ?(all labs ordered are listed, but only abnormal results are displayed) ?Labs Reviewed - No data to display ? ?EKG ?None ? ?Radiology ?No results found. ? ?Procedures ?TRACHEOSTOMY REPLACEMENT ? ?Date/Time: 12/28/2021 11:18 PM ?Performed by: Deno Etienne, DO ?Authorized by: Deno Etienne, DO  ?Consent: Verbal consent obtained. ?Risks and benefits: risks, benefits and alternatives were discussed ?Consent given by: patient ?Patient understanding: patient does not state understanding of the procedure being performed ?Patient consent: the patient's understanding of the procedure does not match consent given ?Procedure consent: procedure consent does not match procedure scheduled ?Imaging studies: imaging studies not available ?Required items: required blood products, implants, devices, and special equipment available ?Time out: Immediately prior to procedure a "time out" was called  to verify the correct patient, procedure, equipment, support staff and site/side marked as required. ?Indications: became dislodged ?Local anesthesia used: no ? ?Anesthesia: ?Local anesthesia used: no ? ?Sedation: ?Patient sedated: no ? ?Preparation: Patient was prepped and draped in the usual sterile fashion. ?Tube type: single cannula ?Tube cuff: cuffless ?Tube size: 8.0 mm ?Patient tolerance: patient tolerated the procedure well with no immediate complications ? ?  ? ? ?Medications Ordered in ED ?Medications - No data to display ? ?ED Course/ Medical Decision Making/ A&P ?  ?                        ?Medical Decision Making ? ?35 yoM with a chief complaints of having his trach fell out.  This coincidently happened as he was placed under arrest.  The patient has not know what size he has. ? ?On my record review the patient had a 2 [place] Lary tube in place.  Respiratory evaluated the stoma at bedside.  She does not know of this being in the inventory and recommended I discuss it with ENT.  I discussed case with Dr. Janace Hoard, ENT he also agreed that we do not carry that in our hospital system.  After some discussion recommended trying a 8 Shiley. ? ?Placed without issue.  D/c home. ? ?11:19 PM:  I have discussed the diagnosis/risks/treatment options with the patient.  Evaluation and diagnostic testing in the emergency department does not suggest an emergent condition requiring admission or immediate  intervention beyond what has been performed at this time.  They will follow up with  ENT. We also discussed returning to the ED immediately if new or worsening sx occur. We discussed the sx which are most concerning (e.g., sudden worsening pain, fever, inability to tolerate by mouth) that necessitate immediate return. Medications administered to the patient during their visit and any new prescriptions provided to the patient are listed below. ? ?Medications given during this visit ?Medications - No data to display ? ? ?The  patient appears reasonably screen and/or stabilized for discharge and I doubt any other medical condition or other [facility] requiring further screening, evaluation, or treatment in the ED at this time prior to discharge.  ? ? ? ? ? ? ? ? ?Final Clinical Impression(s) / ED Diagnoses ?Final diagnoses:  ?Tracheostomy malfunction ([place])  ? ? ?Rx / DC Orders ?ED Discharge Orders   ? ? None  ? ?  ? ? ?  ?Deno Etienne, DO ?12/28/21 2319 ? ?

## 2021-12-28 NOTE — Discharge Instructions (Addendum)
Follow-up with your ENT in the office to have your tracheostomy site reevaluated.  They may choose to replace your current trach with something else. ? ?You are medically cleared to go to jail  ?

## 2022-01-13 ENCOUNTER — Encounter (HOSPITAL_COMMUNITY): Payer: Self-pay | Admitting: Emergency Medicine

## 2022-01-13 ENCOUNTER — Emergency Department (HOSPITAL_COMMUNITY)

## 2022-01-13 ENCOUNTER — Inpatient Hospital Stay (HOSPITAL_COMMUNITY)
Admission: EM | Admit: 2022-01-13 | Discharge: 2022-01-22 | DRG: 157 | Attending: Infectious Diseases | Admitting: Infectious Diseases

## 2022-01-13 ENCOUNTER — Other Ambulatory Visit: Payer: Self-pay

## 2022-01-13 ENCOUNTER — Observation Stay (HOSPITAL_COMMUNITY)

## 2022-01-13 DIAGNOSIS — L03221 Cellulitis of neck: Secondary | ICD-10-CM

## 2022-01-13 DIAGNOSIS — R221 Localized swelling, mass and lump, neck: Secondary | ICD-10-CM

## 2022-01-13 DIAGNOSIS — B1910 Unspecified viral hepatitis B without hepatic coma: Secondary | ICD-10-CM | POA: Diagnosis present

## 2022-01-13 DIAGNOSIS — R55 Syncope and collapse: Secondary | ICD-10-CM | POA: Diagnosis present

## 2022-01-13 DIAGNOSIS — C140 Malignant neoplasm of pharynx, unspecified: Secondary | ICD-10-CM

## 2022-01-13 DIAGNOSIS — R609 Edema, unspecified: Secondary | ICD-10-CM

## 2022-01-13 DIAGNOSIS — Z9221 Personal history of antineoplastic chemotherapy: Secondary | ICD-10-CM

## 2022-01-13 DIAGNOSIS — R491 Aphonia: Secondary | ICD-10-CM | POA: Diagnosis present

## 2022-01-13 DIAGNOSIS — C7951 Secondary malignant neoplasm of bone: Secondary | ICD-10-CM | POA: Diagnosis present

## 2022-01-13 DIAGNOSIS — R220 Localized swelling, mass and lump, head: Secondary | ICD-10-CM

## 2022-01-13 DIAGNOSIS — Z9002 Acquired absence of larynx: Secondary | ICD-10-CM

## 2022-01-13 DIAGNOSIS — Z8589 Personal history of malignant neoplasm of other organs and systems: Secondary | ICD-10-CM

## 2022-01-13 DIAGNOSIS — Z930 Tracheostomy status: Secondary | ICD-10-CM

## 2022-01-13 DIAGNOSIS — Z923 Personal history of irradiation: Secondary | ICD-10-CM

## 2022-01-13 DIAGNOSIS — R519 Headache, unspecified: Secondary | ICD-10-CM | POA: Diagnosis present

## 2022-01-13 DIAGNOSIS — G629 Polyneuropathy, unspecified: Secondary | ICD-10-CM

## 2022-01-13 DIAGNOSIS — F17210 Nicotine dependence, cigarettes, uncomplicated: Secondary | ICD-10-CM | POA: Diagnosis present

## 2022-01-13 DIAGNOSIS — R252 Cramp and spasm: Secondary | ICD-10-CM | POA: Diagnosis present

## 2022-01-13 DIAGNOSIS — K122 Cellulitis and abscess of mouth: Secondary | ICD-10-CM | POA: Diagnosis not present

## 2022-01-13 DIAGNOSIS — Z886 Allergy status to analgesic agent status: Secondary | ICD-10-CM

## 2022-01-13 DIAGNOSIS — H538 Other visual disturbances: Secondary | ICD-10-CM | POA: Diagnosis present

## 2022-01-13 DIAGNOSIS — K089 Disorder of teeth and supporting structures, unspecified: Secondary | ICD-10-CM | POA: Diagnosis present

## 2022-01-13 DIAGNOSIS — R1310 Dysphagia, unspecified: Secondary | ICD-10-CM | POA: Diagnosis not present

## 2022-01-13 DIAGNOSIS — D72819 Decreased white blood cell count, unspecified: Secondary | ICD-10-CM

## 2022-01-13 DIAGNOSIS — G63 Polyneuropathy in diseases classified elsewhere: Secondary | ICD-10-CM

## 2022-01-13 DIAGNOSIS — R1110 Vomiting, unspecified: Secondary | ICD-10-CM | POA: Diagnosis present

## 2022-01-13 DIAGNOSIS — J9503 Malfunction of tracheostomy stoma: Secondary | ICD-10-CM | POA: Diagnosis present

## 2022-01-13 DIAGNOSIS — Z823 Family history of stroke: Secondary | ICD-10-CM

## 2022-01-13 DIAGNOSIS — B1920 Unspecified viral hepatitis C without hepatic coma: Secondary | ICD-10-CM | POA: Diagnosis present

## 2022-01-13 DIAGNOSIS — E890 Postprocedural hypothyroidism: Secondary | ICD-10-CM | POA: Diagnosis present

## 2022-01-13 DIAGNOSIS — J690 Pneumonitis due to inhalation of food and vomit: Secondary | ICD-10-CM

## 2022-01-13 DIAGNOSIS — L0390 Cellulitis, unspecified: Secondary | ICD-10-CM | POA: Diagnosis present

## 2022-01-13 DIAGNOSIS — D638 Anemia in other chronic diseases classified elsewhere: Secondary | ICD-10-CM | POA: Diagnosis present

## 2022-01-13 LAB — CBC WITH DIFFERENTIAL/PLATELET
Abs Immature Granulocytes: 0.05 10*3/uL (ref 0.00–0.07)
Basophils Absolute: 0 10*3/uL (ref 0.0–0.1)
Basophils Relative: 1 %
Eosinophils Absolute: 0 10*3/uL (ref 0.0–0.5)
Eosinophils Relative: 2 %
HCT: 30.7 % — ABNORMAL LOW (ref 39.0–52.0)
Hemoglobin: 9.8 g/dL — ABNORMAL LOW (ref 13.0–17.0)
Immature Granulocytes: 2 %
Lymphocytes Relative: 14 %
Lymphs Abs: 0.3 10*3/uL — ABNORMAL LOW (ref 0.7–4.0)
MCH: 31.3 pg (ref 26.0–34.0)
MCHC: 31.9 g/dL (ref 30.0–36.0)
MCV: 98.1 fL (ref 80.0–100.0)
Monocytes Absolute: 0.2 10*3/uL (ref 0.1–1.0)
Monocytes Relative: 10 %
Neutro Abs: 1.7 10*3/uL (ref 1.7–7.7)
Neutrophils Relative %: 71 %
Platelets: 179 10*3/uL (ref 150–400)
RBC: 3.13 MIL/uL — ABNORMAL LOW (ref 4.22–5.81)
RDW: 13.9 % (ref 11.5–15.5)
WBC: 2.3 10*3/uL — ABNORMAL LOW (ref 4.0–10.5)
nRBC: 0 % (ref 0.0–0.2)

## 2022-01-13 LAB — HEPATITIS B SURFACE ANTIGEN: Hepatitis B Surface Ag: NONREACTIVE

## 2022-01-13 LAB — BASIC METABOLIC PANEL
Anion gap: 9 (ref 5–15)
Anion gap: 3 — ABNORMAL LOW (ref 5–15)
BUN: 11 mg/dL (ref 6–20)
BUN: 10 mg/dL (ref 6–20)
CO2: 34 mmol/L — ABNORMAL HIGH (ref 22–32)
CO2: 35 mmol/L — ABNORMAL HIGH (ref 22–32)
Calcium: 7.3 mg/dL — ABNORMAL LOW (ref 8.9–10.3)
Calcium: 7.4 mg/dL — ABNORMAL LOW (ref 8.9–10.3)
Chloride: 97 mmol/L — ABNORMAL LOW (ref 98–111)
Chloride: 99 mmol/L (ref 98–111)
Creatinine, Ser: 1.01 mg/dL (ref 0.61–1.24)
Creatinine, Ser: 1.01 mg/dL (ref 0.61–1.24)
GFR, Estimated: >60 mL/min (ref >60)
GFR, Estimated: >60 mL/min (ref >60)
Glucose, Bld: 67 mg/dL — ABNORMAL LOW (ref 70–99)
Glucose, Bld: 73 mg/dL (ref 70–99)
Potassium: 3.4 mmol/L — ABNORMAL LOW (ref 3.5–5.1)
Potassium: 3.5 mmol/L (ref 3.5–5.1)
Sodium: 140 mmol/L (ref 135–145)
Sodium: 137 mmol/L (ref 135–145)

## 2022-01-13 LAB — FOLATE: Folate: 18.1 ng/mL (ref >5.9)

## 2022-01-13 LAB — RETICULOCYTES
Immature Retic Fract: 25.4 % — ABNORMAL HIGH (ref 2.3–15.9)
RBC.: 3.23 MIL/uL — ABNORMAL LOW (ref 4.22–5.81)
Retic Count, Absolute: 51.4 10*3/uL (ref 19.0–186.0)
Retic Ct Pct: 1.6 % (ref 0.4–3.1)

## 2022-01-13 LAB — TSH: TSH: 98.488 u[IU]/mL — ABNORMAL HIGH (ref 0.350–4.500)

## 2022-01-13 LAB — HEMOGLOBIN A1C
Hgb A1c MFr Bld: 5.6 % (ref 4.8–5.6)
Mean Plasma Glucose: 114.02 mg/dL

## 2022-01-13 LAB — IRON AND TIBC
Iron: 97 ug/dL (ref 45–182)
Saturation Ratios: 27 % (ref 17.9–39.5)
TIBC: 361 ug/dL (ref 250–450)
UIBC: 264 ug/dL

## 2022-01-13 LAB — FERRITIN: Ferritin: 60 ng/mL (ref 24–336)

## 2022-01-13 LAB — MAGNESIUM: Magnesium: 1.9 mg/dL (ref 1.7–2.4)

## 2022-01-13 LAB — VITAMIN B12: Vitamin B-12: 309 pg/mL (ref 180–914)

## 2022-01-13 LAB — HIV ANTIBODY (ROUTINE TESTING W REFLEX): HIV Screen 4th Generation wRfx: NONREACTIVE

## 2022-01-13 MED ORDER — PIPERACILLIN-TAZOBACTAM 3.375 G IVPB 30 MIN
3.3750 g | Freq: Once | INTRAVENOUS | Status: AC
  Administered 2022-01-13: 3.375 g via INTRAVENOUS
  Filled 2022-01-13: qty 50

## 2022-01-13 MED ORDER — PIPERACILLIN-TAZOBACTAM 3.375 G IVPB
3.3750 g | Freq: Three times a day (TID) | INTRAVENOUS | Status: DC
  Administered 2022-01-13 – 2022-01-18 (×14): 3.375 g via INTRAVENOUS
  Filled 2022-01-13 (×13): qty 50

## 2022-01-13 MED ORDER — ACETAMINOPHEN 650 MG RE SUPP: 650.0000 mg | Freq: Four times a day (QID) | RECTAL | Status: DC | PRN

## 2022-01-13 MED ORDER — ACETAMINOPHEN 325 MG PO TABS
650.0000 mg | ORAL_TABLET | Freq: Four times a day (QID) | ORAL | Status: DC | PRN
  Administered 2022-01-14 – 2022-01-21 (×10): 650 mg via ORAL
  Filled 2022-01-13 (×10): qty 2

## 2022-01-13 MED ORDER — LINEZOLID 600 MG/300ML IV SOLN
600.0000 mg | Freq: Once | INTRAVENOUS | Status: AC
  Administered 2022-01-13: 600 mg via INTRAVENOUS
  Filled 2022-01-13: qty 300

## 2022-01-13 MED ORDER — THIAMINE HCL 100 MG/ML IJ SOLN
100.0000 mg | Freq: Every day | INTRAMUSCULAR | Status: DC
  Administered 2022-01-13 – 2022-01-19 (×7): 100 mg via INTRAVENOUS
  Filled 2022-01-13 (×7): qty 2

## 2022-01-13 MED ORDER — IOHEXOL 300 MG/ML  SOLN
75.0000 mL | Freq: Once | INTRAMUSCULAR | Status: AC | PRN
  Administered 2022-01-13: 75 mL via INTRAVENOUS

## 2022-01-13 MED ORDER — FOLIC ACID 5 MG/ML IJ SOLN
1.0000 mg | Freq: Every day | INTRAMUSCULAR | Status: DC
  Administered 2022-01-13 – 2022-01-18 (×6): 1 mg via INTRAVENOUS
  Filled 2022-01-13 (×7): qty 0.2

## 2022-01-13 MED ORDER — ENOXAPARIN SODIUM 40 MG/0.4ML IJ SOSY
40.0000 mg | PREFILLED_SYRINGE | INTRAMUSCULAR | Status: DC
  Administered 2022-01-13 – 2022-01-21 (×9): 40 mg via SUBCUTANEOUS
  Filled 2022-01-13 (×9): qty 0.4

## 2022-01-13 NOTE — Consult Note (Signed)
Reason for Consult: Neck pain and swelling ?Referring Physician: Pearline Cables ? ?Zakariye Nee is an 46 y.o. male.  ?HPI: Mr. 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  I have no records to review at this visit as he was treated in an outside hospital.  He comes in today with increasing pain and swelling in his floor of mouth and neck.  He has had no follow-up imaging studies after completion of his cancer treatment course.  He was lost to follow-up. ? ?History reviewed. No pertinent past medical history. ? ?History reviewed. No pertinent surgical history. ? ?History reviewed. No pertinent family history. ? ?Social History:  reports that he has been smoking cigarettes. He has been smoking an average of 1 pack per day. He has never used smokeless tobacco. He reports current alcohol use. He reports that he does not use drugs. ? ?Allergies:  ?Allergies  ?Allergen Reactions  ? Ibuprofen Hives  ? ? ?Medications: I have reviewed the patient's current medications. ? ?Results for orders placed or performed during the hospital encounter of 01/13/22 (from the past 48 hour(s))  ?CBC with Differential     Status: Abnormal  ? Collection Time: 01/13/22 12:08 PM  ?Result Value Ref Range  ? WBC 2.3 (L) 4.0 - 10.5 K/uL  ? RBC 3.13 (L) 4.22 - 5.81 MIL/uL  ? Hemoglobin 9.8 (L) 13.0 - 17.0 g/dL  ? HCT 30.7 (L) 39.0 - 52.0 %  ? MCV 98.1 80.0 - 100.0 fL  ? MCH 31.3 26.0 - 34.0 pg  ? MCHC 31.9 30.0 - 36.0 g/dL  ? RDW 13.9 11.5 - 15.5 %  ? Platelets 179 150 - 400 K/uL  ? nRBC 0.0 0.0 - 0.2 %  ? Neutrophils Relative % 71 %  ? Neutro Abs 1.7 1.7 - 7.7 K/uL  ? Lymphocytes Relative 14 %  ? Lymphs Abs 0.3 (L) 0.7 - 4.0 K/uL  ? Monocytes Relative 10 %  ? Monocytes Absolute 0.2 0.1 - 1.0 K/uL  ? Eosinophils Relative 2 %  ? Eosinophils Absolute 0.0 0.0 - 0.5 K/uL  ? Basophils Relative 1 %  ? Basophils Absolute 0.0 0.0 - 0.1 K/uL  ? Immature  Granulocytes 2 %  ? Abs Immature Granulocytes 0.05 0.00 - 0.07 K/uL  ?  Comment: Performed at [facility], [place] [address] [place], [place] [address]  ?Basic metabolic panel     Status: Abnormal  ? Collection Time: 01/13/22 12:08 PM  ?Result Value Ref Range  ? Sodium 140 135 - 145 mmol/L  ? Potassium 3.4 (L) 3.5 - 5.1 mmol/L  ? Chloride 97 (L) 98 - 111 mmol/L  ? CO2 34 (H) 22 - 32 mmol/L  ? Glucose, Bld 67 (L) 70 - 99 mg/dL  ?  Comment: Glucose reference range applies only to samples taken after fasting for at least 8 hours.  ? BUN 11 6 - 20 mg/dL  ? Creatinine, Ser 1.01 0.61 - 1.24 mg/dL  ? Calcium 7.3 (L) 8.9 - 10.3 mg/dL  ? GFR, Estimated >60 >60 mL/min  ?  Comment: (NOTE) ?Calculated using the CKD-EPI Creatinine Equation (2021) ?  ? Anion gap 9 5 - 15  ?  Comment: Performed at [facility], [place] [address], [place], [place] [address]  ? ? ?CT Soft Tissue Neck W Contrast ? ?Result Date: 01/13/2022 ?CLINICAL DATA:  Laryngeal edema previous throat cancer,  trach, new tongue swelling, unable to open mouth more than two fingers worth EXAM: CT NECK WITH CONTRAST TECHNIQUE: Multidetector CT imaging of the neck was performed using the standard protocol following the bolus administration of intravenous contrast. RADIATION DOSE REDUCTION: This exam was performed according to the departmental dose-optimization program which includes automated exposure control, adjustment of the mA and/or kV according to patient size and/or use of iterative reconstruction technique. CONTRAST:  84m OMNIPAQUE IOHEXOL 300 MG/ML  SOLN COMPARISON:  None Available. FINDINGS: Extensive postsurgical changes, including partial pharyngectomy, total laryngectomy, thyroidectomy, and base of tongue resection. Large flap within the region of resection. There is soft tissue thickening and edema involving the residual floor mouth musculature, residual tongue and oropharynx there also is small volume of prevertebral fluid and soft tissue encasing  the left carotid. Mild subcutaneous edema throughout the neck. Bilateral nodal dissections. No evidence of pathologically enlarged lymph nodes. Resection of the right submandibular gland. Left submandibular gland and parotid glands are unremarkable. Tracheostomy tube is partially imaged in the lower trachea. No consolidation in the visualized lung apices. No evidence of acute osseous abnormality. Major arteries are grossly patent in the neck. IMPRESSION: 1. Extensive postsurgical changes, including partial pharyngectomy, total laryngectomy, thyroidectomy, base of tongue resection, and nodal dissections. 2. Soft tissue thickening and edema involving the residual floor mouth musculature, tongue and oropharynx with small volume of prevertebral fluid, soft tissue encasing the left carotid, and mild diffuse subcutaneous edema. These findings could be secondary to any combination of post treatment change or superimposed infection. No discrete, drainable fluid collection. No clearly masslike/nodular soft tissue component to suggest recurrent malignancy, but evaluation is limited given no post treatment prior for comparison. Direct mucosal visualization and continue follow-up imaging is recommended. Electronically Signed   By: FMargaretha SheffieldM.D.   On: 01/13/2022 15:00  ? ?DG Chest Portable 1 View ? ?Result Date: 01/13/2022 ?CLINICAL DATA:  sob EXAM: PORTABLE CHEST 1 VIEW COMPARISON:  Chest x-ray October 29, 2020. FINDINGS: Tracheostomy tube with tip approximately 4.9 cm above the carina. Clips at the thoracic inlet. Low lung volumes. Mild left basilar opacity. No visible pleural effusions or pneumothorax. Cardiomediastinal silhouette is accentuated by AP technique. IMPRESSION: Low lung volumes with mild left basilar opacity, which could represent atelectasis, aspiration, and/or pneumonia. Electronically Signed   By: FMargaretha SheffieldM.D.   On: 01/13/2022 13:09   ? ?Review of Systems ?Blood pressure (!) 144/63, pulse 63,  temperature 98.1 ?F (36.7 ?C), temperature source Oral, resp. rate 15, SpO2 100 %. ?Physical Exam ? ?Patient is aphonic. ?Tracheostomy is in place. ?He is cooperative with the exam and and some discomfort. ?Woody induration involving his neck ?Floor of mouth is swollen ?Dentition is nontender without evidence of abscess ?Symmetric expansions bilaterally without use of accessory muscles ? ?CT scan reviewed ? ?Assessment/Plan: ? ?History of squamous cell carcinoma head neck status post surgery and radiation ?Suspected soft tissue neck infection ?Leukopenia ? ?I do not see any evidence on the scan of recurrence of disease although he probably needs some additional imaging.  Not sure if a PET scan would be appropriate but will defer to radiation oncology.  This patient should be admitted and treated for an apparent soft tissue infection.  The fact his white count is so low is a bit confusing and I would work that up as well including HIV testing.  I would admit this patient either to the oncology service or medicine for his leukopenia work-up and management of the  soft tissue infection. ? ?Boyce Medici. ?01/13/2022, 4:14 PM  ? ? ? ? ?

## 2022-01-13 NOTE — ED Triage Notes (Signed)
Pt with hx of throat CA had trach replaced in ED on 5/1. Swelling to mouth and BLE L>R since 5/2. Jail reports 33lb weight gain in 15 days. Endorses leg cramping and mild sob and that he has not been medicated while in jail since 5/2 because he has not been given his medication.  ?

## 2022-01-13 NOTE — Hospital Course (Addendum)
R side of face feels numb.  ? ?Tongue gets more swollen and numb.  ? ?Stays cold, entire body.  ? ?Never had this problem before.  ? ?My face and tongue and feet and legs are swollen since the 2nd of May and hands feel numb and cold. Im crapping in my legs and hands my face is numb on the right side and my tongue its getting worse.  ? ?I've never had this problem before.  ? ?The jail gave me medications.  ? ?No drainage around the stoma.  ? ?Loses balance easily.  ? ?12/28/21 Had some type of exchange. Since the second of May had pain around throat and numbness and feeling chills.  ? ?At the same time tongue started feeling numb and sore.  ? ?Face was numb on the right side.   ? ?Headache lower part of the back of the head ? ? ?No fevers or chills.  ?Feels cold but this started started a couple of weeks.  ?More difficulty breathing.  ? ?No tooth pain. No bleeding gums.  ? ?Pain around throat,  ?No CP ?Pain cramping in legs ?Loss of balance started around the same time ?Had to hold arm to help him walk ?No abd pain ?No other complications ? ?Feet feel numb ? ?N/v - 5 or 6 times yesterday ? ?Had an episode prior to going to jail where he felt dizzy, pre syncope.  ? ?PMH:  ?12/2020  ?Low heart rate ? ? ?No heart attacks in the family ?Mom passed away with cancer ?Dad had a stroke ? ?Social history: Has lived in the area for 3 years, currently incarcerated in [facility]. Smoked about a pack per day for 30 days. No alcohol use.  ? ? ?Zosyn and vanc  ?Question of potential clinda - shortage - linezolid ? ?Zosyn and linezolid ?Taking cefepime out ? ? ?

## 2022-01-13 NOTE — H&P (Addendum)
Date: 01/13/2022               Patient Name:  Gregory Snyder MRN: [id number]  DOB: 1975/10/11 Age / Sex: 46 y.o., male   PCP: System, Provider Not In         Medical Service: Internal Medicine Teaching Service         Attending Physician: Dr. Johnnye Sima, Doroteo Bradford, MD    First Contact: Dr. Lorin Glass Pager: [phone]  Second Contact: Dr. Eulas Post Pager: [phone]       After Hours (After 5p/  First Contact Pager: [phone]  weekends / holidays): Second Contact Pager: [phone]   Chief Complaint: tongue and leg swelling/numbness  History of Present Illness: Gregory Snyder is a 46 y.o. male with a PMHx of squamous cell carcinoma of the head and neck status post cervical lymphadenectomy with modified radical neck dissection and tracheostomy (performed last year) who presents here today with chief complaint of tongue and leg swelling/numbness.   The patient states that he presented here on May 1 with a chief complaint of his tracheostomy tube falling out.  He states that he was in the shower and noticed that it fell out, prompting him to come here. Per review of records, the patient had a 63 [place] Lary tube in place but did not carry this in our hospital so instead tried an 8 Shiley.  The following day, his hands, feet, and right side of his face started to feel numb and cold. Also noticed worsening swelling in his tongue and legs since then. His legs have also been cramping since then. This has never happened to him before. The swelling and numbness have been worsening since onset. Additionally, he has felt off balance since his other symptoms started.  The jail guard mention that, when they were walking on the way over here, the patient had to hold onto the guard due to almost falling twice in the parking lot.  Also endorses a headache located at the lower part of his head in the back area.  Endorses some difficulty breathing since his symptoms started.  Denies tooth pain or bleeding gums or other trauma  to the face or mouth area.  He vomited 5 or 6 times yesterday, unsure if there was blood in his vomit.  Also endorsed an episode prior to going to jail where he felt dizzy and fell like he was going to blackout.  Denies fevers but states that his entire body feels cold.  Denies drainage around the stoma, chest pain, abdominal pain, difficulty urinating, difficulty with bowel movements.  No other complaints or concerns today.  Other chart review: S/p modified radical neck dissection and tracheostomy on May 20th 2022. Charts from [facility] demonstrate pt was seen at [facility] in [place] with ENT specialist Dr. Gordy Councilman and received radiation with Dr. Cephas Darby. It seems as though pt was lost to follow up with multiple of their charts stating pt had missed appointments and missed f/u imaging 3 months post radiation therapy... possibly bc pt was in jail.  Meds:  No outpatient medications have been marked as taking for the 01/13/22 encounter [facility] Encounter).    Allergies: Allergies as of 01/13/2022 - Review Complete 01/13/2022  Allergen Reaction Noted   Ibuprofen Hives 05/08/2019   History reviewed. No pertinent past medical history.  Family History: Mother passed away from metastatic cancer, dad died from a stroke.  Social History: Has lived in the area for the  past 3 years.  He is currently incarcerated at [facility]. Smoked about a pack per day for 30 days. No alcohol use.   Review of Systems: A complete ROS was negative except as per HPI.   Physical Exam: Blood pressure 113/84, pulse 67, temperature 98.1 F (36.7 C), temperature source Oral, resp. rate 10, height '5\' 9"'$  (1.753 m), weight 81.6 kg, SpO2 100 %. General: NAD, nl appearance, lying comfortably in bed, pt is aphonic HE: Normocephalic, atraumatic, EOMI, Conjunctivae normal ENT: Tracheostomy is in place, mild erythema around the site but no signs of drainage. Poor dentition. Unable to  fully open mouth due to significant swelling. No signs of infection in the mouth. Cardiovascular: Normal rate, regular rhythm. No murmurs, rubs, or gallops Pulmonary: Effort normal, breath sounds normal. No wheezes, rales, or rhonchi Abdominal: soft, nontender, bowel sounds present Musculoskeletal: no swelling, deformity, injury or tenderness in extremities Skin: Warm, dry, no bruising, erythema, or rash Psychiatric/Behavioral: normal mood, normal behavior     Neurologic exam: Mental status: A&Ox3 Cranial Nerves:             II: PERRL             III, IV, VI: Extra-occular motions intact bilaterally             V, VII: Decreased sensation of right side of face, facial droop is present on the right side.              IX, X: Unable to visualize palate due to significant mouth swelling             XI: Weak shoulder shrug on the right side, normal on the left             XII: tongue is midline    Motor: Strength is 4/5 in RUE and RLE, 5/5 in LUE and LLE. Sensory: Decreased sensation of RUE and RLE but otherwise intact Coordination: Dysmetria is present with pt using his right hand. Psychiatric: Normal mood and affect  EKG: personally reviewed my interpretation is bradycardia   CXR: personally reviewed my interpretation is low lung volumes with mild left basilar opacity, which could represent atelectasis, aspiration, and/or pneumonia.  CT Soft Tissue Neck with Contrast: IMPRESSION: 1. Extensive postsurgical changes, including partial pharyngectomy, total laryngectomy, thyroidectomy, base of tongue resection, and nodal dissections. 2. Soft tissue thickening and edema involving the residual floor mouth musculature, tongue and oropharynx with small volume of prevertebral fluid, soft tissue encasing the left carotid, and mild diffuse subcutaneous edema. These findings could be secondary to any combination of post treatment change or superimposed infection. No discrete, drainable fluid  collection. No clearly masslike/nodular soft tissue component to suggest recurrent malignancy, but evaluation is limited given no post treatment prior for comparison. Direct mucosal visualization and continue follow-up imaging is recommended.   Assessment & Plan by Problem: Principal Problem:   Cellulitis  Ludwig angina This is a patient with a PMHx of squamous cell carcinoma of the head and neck status post cervical lymphadenectomy with modified radical neck dissection and tracheostomy who presents here today with facial swelling/numbness which started after his tracheostomy tube was replaced on May 1.  On arrival, he is afebrile and hemodynamically stable, satting well on room air. Pt was breathing well on exam, but he was unable to open his mouth fully due to significant swelling. CT soft tissue neck is concerning for soft tissue infection without a drainable fluid collection. ENT was consulted, and pt was  started on abx in the ED. Will continue to monitor his respiratory status closely.  -Appreciate ENT recommendations -Zosyn per pharmacy -IV Linezolid 600 mg q12h in the setting of leukopenia -Follow-up AM CBC  Numbness and tingling Loss of balance The patient presents with  ~ 2 week history of loss of balance with ambulation as well as numbness and tingling of the right side of his face as well as the distal extremities. Differential diagnosis includes vitamin deficiency, HIV infection, hypothyroidism, hepatitis infection. Less likely stroke given all symptoms are present on one side, CT head is negative here. -Vitamin B12 and folate pending -TSH pending -Hepatitis panel pending -HIV antibody pending  Leukopenia Presented here with a WBC of 2.3. Review of records shows that his WBC were decreased in 2022. Differential diagnosis includes HIV infection vs hepatitis. -HIV antibody pending -Hepatitis panel pending -Trend CBC  Normocytic anemia Hemoglobin on admit of 9.8.  MCV 98.1.   Hemoglobin ~10-11 in August 2022.  No overt signs of bleeding.  Reticulocyte labs reflect a hypoproliferative state.  Differential diagnosis includes vitamin deficiency versus anemia of chronic disease in the setting of longstanding infection. -Vitamin B12 and folate pending -HIV antibody pending -Hepatitis panel pending -Iron and TIBC pending  Hepatitis B/C, untreated Labs from June 2022 show reactive hep B surface antigen, reactive hep B core total antibody, hepatitis C antibody, HCV RNA of 3.5 million.  Per review of records, a GI/hepatology referral was considered as outpatient, however cannot locate any GI consult records. -CMP pending -Hepatitis panel pending  Dispo: Admit patient to Observation with expected length of stay less than 2 midnights.  Signed: Orvis Brill, MD 01/13/2022, 6:44 PM  Pager: [phone]  After 5pm on weekdays and 1pm on weekends: On Call pager: [phone]

## 2022-01-13 NOTE — Progress Notes (Incomplete)
Pharmacy Antibiotic Note ? ?Gregory Snyder is a 46 y.o. male admitted on 01/13/2022 with  Ludwigs angina  .  Pharmacy has been consulted for *** dosing. ? 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 He has a tracheostomy with trach change 12/29/21. He is followed by [facility] with plans to transfer to follow with his ENT specialist there. ? ?WBC 2.3, afebrile,  ? ?Plan: ?Zosyn 3.375g IV q8h (4 hour infusion). ? ?  ? ?Temp (24hrs), Avg:98.1 ?F (36.7 ?C), Min:98.1 ?F (36.7 ?C), Max:98.1 ?F (36.7 ?C) ? ?Recent Labs  ?Lab 01/13/22 ?1208  ?WBC 2.3*  ?CREATININE 1.01  ?  ?CrCl cannot be calculated (Unknown ideal weight.).   ? ?Allergies  ?Allergen Reactions  ? Ibuprofen Hives  ? ? ?Antimicrobials this admission: ?*** *** >> *** ?*** *** >> *** ? ?Dose adjustments this admission: ?*** ? ?Microbiology results: ?*** BCx: *** ?*** UCx: ***  ?*** Sputum: ***  ?*** MRSA PCR: *** ? ?Thank you for allowing pharmacy to be a part of this patient?s care. ? ?Eduard Clos Deasia Chiu ?01/13/2022 4:24 PM ? ?

## 2022-01-13 NOTE — Progress Notes (Signed)
Pharmacy Antibiotic Note ? ?Gregory Snyder is a 46 y.o. male for which pharmacy has been consulted for zosyn dosing for  ludwig's angina . ? 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 He has a tracheostomy with trach change 12/29/21. He is followed by [facility] with plans to transfer to follow with his ENT specialist there. ? ?SCr 1.01 - at baseline ?WBC 2.3; T 98.1 F; HR 63; RR 15 ? ?Plan: ?IMTS will provide one time linezolid tonight citing concern for toxin-production in the setting of clindamycin shortage will re-assess in AM vanco vs linezolid for continued therapy  ?Zosyn 3.375g IV q8h (4 hour infusion) ? ?Trend WBC, Fever, Renal function, & Clinical course ?F/u cultures, clinical course, WBC, fever ?De-escalate when able ?F/u ID recommendations ? ?Height: '5\' 9"'$  (175.3 cm) ?Weight: 81.6 kg (180 lb) ?IBW/kg (Calculated) : 70.7 ? ?Temp (24hrs), Avg:98.1 ?F (36.7 ?C), Min:98 ?F (36.7 ?C), Max:98.1 ?F (36.7 ?C) ? ?Recent Labs  ?Lab 01/13/22 ?1208 01/13/22 ?1756  ?WBC 2.3*  --   ?CREATININE 1.01 1.01  ?  ?Estimated Creatinine Clearance: 92.4 mL/min (by C-G formula based on SCr of 1.01 mg/dL).   ? ?Allergies  ?Allergen Reactions  ? Ibuprofen Hives  ? ?Antimicrobials this admission: ?linezolid 5/17 >>  ?zosyn 5/17 >> ? ?Microbiology results: ?Pending ? ?Thank you for allowing pharmacy to be a part of this patient?s care. ? ?Lorelei Pont, PharmD, BCPS ?01/13/2022 9:11 PM ?ED Clinical Pharmacist -  [phone] ?  ?

## 2022-01-13 NOTE — ED Notes (Signed)
ENT at bedside

## 2022-01-13 NOTE — ED Provider Notes (Addendum)
?[facility] ?Provider Note ? ? ?CSN: [id number] ?Arrival date & time: 01/13/22  1139 ? ?  ? ?History ? ?Chief Complaint  ?Patient presents with  ? Oral Swelling  ? Leg Swelling  ? ? ?Gregory Snyder is a 46 y.o. male. ? ?Patient is a 46 year old male presenting with tongue swelling history of cervical lymphadenectomy with modified radical neck dissection and tracheostomy. Patient currently residing in jail, states he has only been receiving some of his home medications.  States he came to the emergency department for a trach change on 12/29/2021.  States since that time he has had bilateral lower extremity swelling, shortness of breath with exertion, orthopnea, and a 33 pound weight gain in the past 15 days.  Patient states he does not normally take Lasix or any diuretics at home. ? ?Chart review demonstrates modified radical neck dissection and tracheostomy on May 20th 2022. Charts from [place] demonstrate pt was seen at [facility] in [place] with ENT specialist Dr. Gordy Councilman and received radiation with Dr. Cephas Darby. It seems as though pt was lost to follow up with multiple of their charts stating pt had missed appointments and missed f/u imaging 3 months post radiation therapy... possibly bc pt was in jail.  ? ?The history is provided by the patient. No language interpreter was used.  ? ?  ? ?Home Medications ?Prior to Admission medications   ?Medication Sig Start Date End Date Taking? Authorizing Provider  ?ondansetron (ZOFRAN ODT) 8 MG disintegrating tablet '8mg'$  ODT q8 hours prn nausea 05/08/19   Palumbo, April, MD  ?   ? ?Allergies    ?Ibuprofen   ? ?Review of Systems   ?Review of Systems  ?Constitutional:  Negative for chills and fever.  ?HENT:  Negative for ear pain and sore throat.   ?     Tongue swelling  ?Eyes:  Negative for pain and visual disturbance.  ?Respiratory:  Positive for shortness of breath. Negative for cough.   ?Cardiovascular:   Positive for leg swelling. Negative for chest pain and palpitations.  ?Gastrointestinal:  Negative for abdominal pain and vomiting.  ?Genitourinary:  Negative for dysuria and hematuria.  ?Musculoskeletal:  Negative for arthralgias and back pain.  ?Skin:  Negative for color change and rash.  ?Neurological:  Negative for seizures and syncope.  ?All other systems reviewed and are negative. ? ?Physical Exam ?Updated Vital Signs ?BP (!) 144/63   Pulse 63   Temp 98.1 ?F (36.7 ?C) (Oral)   Resp 15   SpO2 100%  ?Physical Exam ?Vitals and nursing note reviewed.  ?Constitutional:   ?   General: He is not in acute distress. ?   Appearance: He is well-developed.  ?HENT:  ?   Head: Normocephalic and atraumatic.  ?   Mouth/Throat:  ?   Comments: Distorted anatomy.  Able to visualize uvula.  Abnormal tongue size possibly secondary to surgical revisions.  Mallampati 4. ?Eyes:  ?   Conjunctiva/sclera: Conjunctivae normal.  ?Cardiovascular:  ?   Rate and Rhythm: Normal rate and regular rhythm.  ?   Heart sounds: No murmur heard. ?Pulmonary:  ?   Effort: Pulmonary effort is normal. No respiratory distress.  ?   Breath sounds: Normal breath sounds. No decreased breath sounds, wheezing, rhonchi or rales.  ?Abdominal:  ?   Palpations: Abdomen is soft.  ?   Tenderness: There is no abdominal tenderness.  ?Musculoskeletal:     ?   General: No  swelling.  ?   Cervical back: Neck supple.  ?   Right lower leg: 2+ Pitting Edema present.  ?   Left lower leg: 2+ Pitting Edema present.  ?Skin: ?   General: Skin is warm and dry.  ?   Capillary Refill: Capillary refill takes less than 2 seconds.  ?Neurological:  ?   Mental Status: He is alert.  ?Psychiatric:     ?   Mood and Affect: Mood normal.  ? ? ?ED Results / Procedures / Treatments   ?Labs ?(all labs ordered are listed, but only abnormal results are displayed) ?Labs Reviewed  ?CBC WITH DIFFERENTIAL/PLATELET - Abnormal; Notable for the following components:  ?    Result Value  ? WBC 2.3 (*)    ? RBC 3.13 (*)   ? Hemoglobin 9.8 (*)   ? HCT 30.7 (*)   ? Lymphs Abs 0.3 (*)   ? All other components within normal limits  ?BASIC METABOLIC PANEL - Abnormal; Notable for the following components:  ? Potassium 3.4 (*)   ? Chloride 97 (*)   ? CO2 34 (*)   ? Glucose, Bld 67 (*)   ? Calcium 7.3 (*)   ? All other components within normal limits  ? ? ?EKG ?None ? ?Radiology ?CT Soft Tissue Neck W Contrast ? ?Result Date: 01/13/2022 ?CLINICAL DATA:  Laryngeal edema previous throat cancer, trach, new tongue swelling, unable to open mouth more than two fingers worth EXAM: CT NECK WITH CONTRAST TECHNIQUE: Multidetector CT imaging of the neck was performed using the standard protocol following the bolus administration of intravenous contrast. RADIATION DOSE REDUCTION: This exam was performed according to the departmental dose-optimization program which includes automated exposure control, adjustment of the mA and/or kV according to patient size and/or use of iterative reconstruction technique. CONTRAST:  30m OMNIPAQUE IOHEXOL 300 MG/ML  SOLN COMPARISON:  None Available. FINDINGS: Extensive postsurgical changes, including partial pharyngectomy, total laryngectomy, thyroidectomy, and base of tongue resection. Large flap within the region of resection. There is soft tissue thickening and edema involving the residual floor mouth musculature, residual tongue and oropharynx there also is small volume of prevertebral fluid and soft tissue encasing the left carotid. Mild subcutaneous edema throughout the neck. Bilateral nodal dissections. No evidence of pathologically enlarged lymph nodes. Resection of the right submandibular gland. Left submandibular gland and parotid glands are unremarkable. Tracheostomy tube is partially imaged in the lower trachea. No consolidation in the visualized lung apices. No evidence of acute osseous abnormality. Major arteries are grossly patent in the neck. IMPRESSION: 1. Extensive postsurgical  changes, including partial pharyngectomy, total laryngectomy, thyroidectomy, base of tongue resection, and nodal dissections. 2. Soft tissue thickening and edema involving the residual floor mouth musculature, tongue and oropharynx with small volume of prevertebral fluid, soft tissue encasing the left carotid, and mild diffuse subcutaneous edema. These findings could be secondary to any combination of post treatment change or superimposed infection. No discrete, drainable fluid collection. No clearly masslike/nodular soft tissue component to suggest recurrent malignancy, but evaluation is limited given no post treatment prior for comparison. Direct mucosal visualization and continue follow-up imaging is recommended. Electronically Signed   By: FMargaretha SheffieldM.D.   On: 01/13/2022 15:00  ? ?DG Chest Portable 1 View ? ?Result Date: 01/13/2022 ?CLINICAL DATA:  sob EXAM: PORTABLE CHEST 1 VIEW COMPARISON:  Chest x-ray October 29, 2020. FINDINGS: Tracheostomy tube with tip approximately 4.9 cm above the carina. Clips at the thoracic inlet. Low lung volumes. Mild left  basilar opacity. No visible pleural effusions or pneumothorax. Cardiomediastinal silhouette is accentuated by AP technique. IMPRESSION: Low lung volumes with mild left basilar opacity, which could represent atelectasis, aspiration, and/or pneumonia. Electronically Signed   By: Margaretha Sheffield M.D.   On: 01/13/2022 13:09   ? ?Procedures ?Marland KitchenCritical Care ?Performed by: Lianne Cure, DO ?Authorized by: Lianne Cure, DO  ? ?Critical care provider statement:  ?  Critical care time (minutes):  30 ?  Critical care was necessary to treat or prevent imminent or life-threatening deterioration of the following conditions: swelling/edema of throat, mouth, oropharynx. ?  Critical care was time spent personally by me on the following activities:  Development of treatment plan with patient or surrogate, discussions with consultants, evaluation of patient's response to  treatment, examination of patient, ordering and review of laboratory studies, ordering and review of radiographic studies, ordering and performing treatments and interventions, pulse oximetry, re-evaluation of pa

## 2022-01-13 NOTE — Progress Notes (Signed)
Pt set  up in room with extra trach and suction equipment.  ?

## 2022-01-14 ENCOUNTER — Encounter (HOSPITAL_COMMUNITY): Payer: Self-pay | Admitting: Infectious Diseases

## 2022-01-14 DIAGNOSIS — Z823 Family history of stroke: Secondary | ICD-10-CM | POA: Diagnosis not present

## 2022-01-14 DIAGNOSIS — B1910 Unspecified viral hepatitis B without hepatic coma: Secondary | ICD-10-CM | POA: Diagnosis present

## 2022-01-14 DIAGNOSIS — J690 Pneumonitis due to inhalation of food and vomit: Secondary | ICD-10-CM | POA: Diagnosis present

## 2022-01-14 DIAGNOSIS — Z85828 Personal history of other malignant neoplasm of skin: Secondary | ICD-10-CM | POA: Diagnosis not present

## 2022-01-14 DIAGNOSIS — E890 Postprocedural hypothyroidism: Secondary | ICD-10-CM | POA: Diagnosis present

## 2022-01-14 DIAGNOSIS — C7951 Secondary malignant neoplasm of bone: Secondary | ICD-10-CM | POA: Diagnosis present

## 2022-01-14 DIAGNOSIS — L03221 Cellulitis of neck: Secondary | ICD-10-CM | POA: Diagnosis not present

## 2022-01-14 DIAGNOSIS — R29810 Facial weakness: Secondary | ICD-10-CM | POA: Diagnosis not present

## 2022-01-14 DIAGNOSIS — H538 Other visual disturbances: Secondary | ICD-10-CM | POA: Diagnosis present

## 2022-01-14 DIAGNOSIS — Z923 Personal history of irradiation: Secondary | ICD-10-CM | POA: Diagnosis not present

## 2022-01-14 DIAGNOSIS — R491 Aphonia: Secondary | ICD-10-CM | POA: Diagnosis present

## 2022-01-14 DIAGNOSIS — F17210 Nicotine dependence, cigarettes, uncomplicated: Secondary | ICD-10-CM | POA: Diagnosis not present

## 2022-01-14 DIAGNOSIS — R609 Edema, unspecified: Secondary | ICD-10-CM | POA: Diagnosis not present

## 2022-01-14 DIAGNOSIS — R1310 Dysphagia, unspecified: Secondary | ICD-10-CM | POA: Diagnosis not present

## 2022-01-14 DIAGNOSIS — R519 Headache, unspecified: Secondary | ICD-10-CM | POA: Diagnosis present

## 2022-01-14 DIAGNOSIS — R55 Syncope and collapse: Secondary | ICD-10-CM | POA: Diagnosis present

## 2022-01-14 DIAGNOSIS — B1920 Unspecified viral hepatitis C without hepatic coma: Secondary | ICD-10-CM | POA: Diagnosis present

## 2022-01-14 DIAGNOSIS — J9503 Malfunction of tracheostomy stoma: Secondary | ICD-10-CM | POA: Diagnosis present

## 2022-01-14 DIAGNOSIS — Z8589 Personal history of malignant neoplasm of other organs and systems: Secondary | ICD-10-CM | POA: Diagnosis not present

## 2022-01-14 DIAGNOSIS — G629 Polyneuropathy, unspecified: Secondary | ICD-10-CM | POA: Diagnosis present

## 2022-01-14 DIAGNOSIS — Z886 Allergy status to analgesic agent status: Secondary | ICD-10-CM | POA: Diagnosis not present

## 2022-01-14 DIAGNOSIS — R252 Cramp and spasm: Secondary | ICD-10-CM | POA: Diagnosis present

## 2022-01-14 DIAGNOSIS — R202 Paresthesia of skin: Secondary | ICD-10-CM | POA: Diagnosis present

## 2022-01-14 DIAGNOSIS — Z9002 Acquired absence of larynx: Secondary | ICD-10-CM | POA: Diagnosis not present

## 2022-01-14 DIAGNOSIS — K089 Disorder of teeth and supporting structures, unspecified: Secondary | ICD-10-CM | POA: Diagnosis present

## 2022-01-14 DIAGNOSIS — D638 Anemia in other chronic diseases classified elsewhere: Secondary | ICD-10-CM | POA: Diagnosis present

## 2022-01-14 DIAGNOSIS — D72819 Decreased white blood cell count, unspecified: Secondary | ICD-10-CM | POA: Diagnosis present

## 2022-01-14 DIAGNOSIS — K122 Cellulitis and abscess of mouth: Secondary | ICD-10-CM | POA: Diagnosis present

## 2022-01-14 LAB — CBC
HCT: 29.6 % — ABNORMAL LOW (ref 39.0–52.0)
Hemoglobin: 9.8 g/dL — ABNORMAL LOW (ref 13.0–17.0)
MCH: 31.5 pg (ref 26.0–34.0)
MCHC: 33.1 g/dL (ref 30.0–36.0)
MCV: 95.2 fL (ref 80.0–100.0)
Platelets: 161 10*3/uL (ref 150–400)
RBC: 3.11 MIL/uL — ABNORMAL LOW (ref 4.22–5.81)
RDW: 13.6 % (ref 11.5–15.5)
WBC: 2.7 10*3/uL — ABNORMAL LOW (ref 4.0–10.5)
nRBC: 0 % (ref 0.0–0.2)

## 2022-01-14 LAB — VITAMIN D 25 HYDROXY (VIT D DEFICIENCY, FRACTURES): Vit D, 25-Hydroxy: 19.37 ng/mL — ABNORMAL LOW (ref 30–100)

## 2022-01-14 LAB — COMPREHENSIVE METABOLIC PANEL
ALT: 32 U/L (ref 0–44)
AST: 48 U/L — ABNORMAL HIGH (ref 15–41)
Albumin: 2.8 g/dL — ABNORMAL LOW (ref 3.5–5.0)
Alkaline Phosphatase: 34 U/L — ABNORMAL LOW (ref 38–126)
Anion gap: 9 (ref 5–15)
BUN: 9 mg/dL (ref 6–20)
CO2: 33 mmol/L — ABNORMAL HIGH (ref 22–32)
Calcium: 7.5 mg/dL — ABNORMAL LOW (ref 8.9–10.3)
Chloride: 97 mmol/L — ABNORMAL LOW (ref 98–111)
Creatinine, Ser: 1.32 mg/dL — ABNORMAL HIGH (ref 0.61–1.24)
GFR, Estimated: >60 mL/min (ref >60)
Glucose, Bld: 80 mg/dL (ref 70–99)
Potassium: 3.6 mmol/L (ref 3.5–5.1)
Sodium: 139 mmol/L (ref 135–145)
Total Bilirubin: 0.5 mg/dL (ref 0.3–1.2)
Total Protein: 5.4 g/dL — ABNORMAL LOW (ref 6.5–8.1)

## 2022-01-14 LAB — T4, FREE: Free T4: <0.25 ng/dL — ABNORMAL LOW (ref 0.61–1.12)

## 2022-01-14 LAB — HEPATITIS B SURFACE ANTIBODY,QUALITATIVE: Hep B S Ab: REACTIVE — AB

## 2022-01-14 LAB — HEPATITIS C ANTIBODY: HCV Ab: REACTIVE — AB

## 2022-01-14 MED ORDER — SODIUM CHLORIDE 0.9 % IV SOLN
1.0000 g | Freq: Once | INTRAVENOUS | Status: AC
  Administered 2022-01-14: 1 g via INTRAVENOUS
  Filled 2022-01-14: qty 10

## 2022-01-14 MED ORDER — ORAL CARE MOUTH RINSE
15.0000 mL | Freq: Two times a day (BID) | OROMUCOSAL | Status: DC
  Administered 2022-01-16 – 2022-01-22 (×13): 15 mL via OROMUCOSAL

## 2022-01-14 MED ORDER — ENSURE ENLIVE PO LIQD
237.0000 mL | Freq: Three times a day (TID) | ORAL | Status: DC
  Administered 2022-01-14 – 2022-01-22 (×31): 237 mL via ORAL

## 2022-01-14 MED ORDER — CHLORHEXIDINE GLUCONATE 0.12 % MT SOLN
15.0000 mL | Freq: Two times a day (BID) | OROMUCOSAL | Status: DC
  Administered 2022-01-14 – 2022-01-22 (×17): 15 mL via OROMUCOSAL
  Filled 2022-01-14 (×17): qty 15

## 2022-01-14 MED ORDER — LINEZOLID 600 MG/300ML IV SOLN
600.0000 mg | Freq: Two times a day (BID) | INTRAVENOUS | Status: DC
Start: 2022-01-14 — End: 2022-01-18
  Administered 2022-01-14 – 2022-01-17 (×8): 600 mg via INTRAVENOUS
  Filled 2022-01-14 (×9): qty 300

## 2022-01-14 MED ORDER — LEVOTHYROXINE SODIUM 75 MCG PO TABS
130.0000 ug | ORAL_TABLET | Freq: Every day | ORAL | Status: DC
  Administered 2022-01-14 – 2022-01-22 (×9): 131 ug via ORAL
  Filled 2022-01-14 (×9): qty 1

## 2022-01-14 NOTE — Progress Notes (Signed)
I coordinated with Dr. Lorin Glass for converting the patient from a trachesotomy tube to a larytube and HME. The SLP and I will do this tomorrow 5/19 with respiratory therapy. Working with NCM and CSW on where to send ongoing supplies once patient is discharged. Length of incarceration is unknown and paperwork requires an address for sending supplies.

## 2022-01-14 NOTE — Progress Notes (Addendum)
Patient in 41M 16 at the time of consult. Consulted by SLP Pollyann Glen to coordinate getting laryngectomy supplies for Gregory Snyder. He now has a laryngectomy kit at bedside that has 2 lary tubes, 60 HMEs, an electrolarynx, neck straps and a shower guard. He is wanting to change from the tracheostomy tube back to a larytube - which is what he is used to using and is best for TL (total laryngectomy) patients. The HME provides filtration and humidification. He is familiar with using an electrolarynx and is currently using the straw mouth piece for speech in addition to the writing tablet in the laryngectomy kit. Since he has been using these supplies for approximately a year - he is familiar with them and would not need much teaching.

## 2022-01-14 NOTE — Consult Note (Signed)
Reason for Consult: Head neck cancer follow-up  Referring Physician: Campbell Riches, MD  Gregory Snyder is an 46 y.o. male.  HPI: Patient admitted for unrelated reasons.  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  He was lost to follow-up.  He is currently in [place].  A couple weeks ago he lost his Fritz Pickerel tube that he had been using.  He has a TEP in place.  He has had a tracheostomy tube in place since being in the hospital.  He breathes better with the tube in place.  He is not able to phonate while the tracheostomy tube is in place.  History reviewed. No pertinent past medical history.  Past Surgical History:  Procedure Laterality Date   TOTAL LARYNGECTOMY N/A 12/2020    History reviewed. No pertinent family history.  Social History:  reports that he has been smoking cigarettes. He has been smoking an average of 1 pack per day. He has never used smokeless tobacco. He reports current alcohol use. He reports that he does not use drugs.  Allergies:  Allergies  Allergen Reactions   Motrin [Ibuprofen] Hives    Medications: Reviewed  Results for orders placed or performed during the hospital encounter of 01/13/22 (from the past 48 hour(s))  CBC with Differential     Status: Abnormal   Collection Time: 01/13/22 12:08 PM  Result Value Ref Range   WBC 2.3 (L) 4.0 - 10.5 K/uL   RBC 3.13 (L) 4.22 - 5.81 MIL/uL   Hemoglobin 9.8 (L) 13.0 - 17.0 g/dL   HCT 30.7 (L) 39.0 - 52.0 %   MCV 98.1 80.0 - 100.0 fL   MCH 31.3 26.0 - 34.0 pg   MCHC 31.9 30.0 - 36.0 g/dL   RDW 13.9 11.5 - 15.5 %   Platelets 179 150 - 400 K/uL   nRBC 0.0 0.0 - 0.2 %   Neutrophils Relative % 71 %   Neutro Abs 1.7 1.7 - 7.7 K/uL   Lymphocytes Relative 14 %   Lymphs Abs 0.3 (L) 0.7 - 4.0 K/uL   Monocytes Relative 10 %   Monocytes Absolute 0.2 0.1 - 1.0 K/uL   Eosinophils Relative 2 %   Eosinophils  Absolute 0.0 0.0 - 0.5 K/uL   Basophils Relative 1 %   Basophils Absolute 0.0 0.0 - 0.1 K/uL   Immature Granulocytes 2 %   Abs Immature Granulocytes 0.05 0.00 - 0.07 K/uL    Comment: Performed at [facility], [address] [place], [place] [address]  Basic metabolic panel     Status: Abnormal   Collection Time: 01/13/22 12:08 PM  Result Value Ref Range   Sodium 140 135 - 145 mmol/L   Potassium 3.4 (L) 3.5 - 5.1 mmol/L   Chloride 97 (L) 98 - 111 mmol/L   CO2 34 (H) 22 - 32 mmol/L   Glucose, Bld 67 (L) 70 - 99 mg/dL    Comment: Glucose reference range applies only to samples taken after fasting for at least 8 hours.   BUN 11 6 - 20 mg/dL   Creatinine, Ser 1.01 0.61 - 1.24 mg/dL   Calcium 7.3 (L) 8.9 - 10.3 mg/dL   GFR, Estimated >60 >60 mL/min    Comment: (NOTE) Calculated using the CKD-EPI Creatinine Equation (2021)    Anion gap 9 5 - 15    Comment: Performed at [facility], [address] [place], [place] [address]  Basic metabolic panel     Status: Abnormal   Collection Time: 01/13/22  5:56 PM  Result Value Ref Range   Sodium 137 135 - 145 mmol/L   Potassium 3.5 3.5 - 5.1 mmol/L   Chloride 99 98 - 111 mmol/L   CO2 35 (H) 22 - 32 mmol/L   Glucose, Bld 73 70 - 99 mg/dL    Comment: Glucose reference range applies only to samples taken after fasting for at least 8 hours.   BUN 10 6 - 20 mg/dL   Creatinine, Ser 1.01 0.61 - 1.24 mg/dL   Calcium 7.4 (L) 8.9 - 10.3 mg/dL   GFR, Estimated >60 >60 mL/min    Comment: (NOTE) Calculated using the CKD-EPI Creatinine Equation (2021)    Anion gap 3 (L) 5 - 15    Comment: Performed at [facility] [address], [place], [place] [address]  HIV Antibody (routine testing w rflx)     Status: None   Collection Time: 01/13/22  5:56 PM  Result Value Ref Range   HIV Screen 4th Generation wRfx Non Reactive Non Reactive    Comment: Performed at [facility], [place] [address] [place], [place] [address]  Magnesium      Status: None   Collection Time: 01/13/22  5:56 PM  Result Value Ref Range   Magnesium 1.9 1.7 - 2.4 mg/dL    Comment: Performed at [facility], [place] [address] [place], [place] [address]  Reticulocytes     Status: Abnormal   Collection Time: 01/13/22  5:56 PM  Result Value Ref Range   Retic Ct Pct 1.6 0.4 - 3.1 %   RBC. 3.23 (L) 4.22 - 5.81 MIL/uL   Retic Count, Absolute 51.4 19.0 - 186.0 K/uL   Immature Retic Fract 25.4 (H) 2.3 - 15.9 %    Comment: Performed at [facility] [address], [place], [place] [address]  Hemoglobin A1c     Status: None   Collection Time: 01/13/22  5:56 PM  Result Value Ref Range   Hgb A1c MFr Bld 5.6 4.8 - 5.6 %    Comment: (NOTE) Pre diabetes:          5.7%-6.4%  Diabetes:              >6.4%  Glycemic control for   <7.0% adults with diabetes    Mean Plasma Glucose 114.02 mg/dL    Comment: Performed at [facility] [address] [place], [place] [address]  TSH     Status: Abnormal   Collection Time: 01/13/22  5:56 PM  Result Value Ref Range   TSH 98.488 (H) 0.350 - 4.500 uIU/mL    Comment: Performed by a 3rd Generation assay with a functional sensitivity of <=0.01 uIU/mL. Performed at [facility], [place] [address], [place], [place] [address]   Vitamin B12     Status: None   Collection Time: 01/13/22  5:56 PM  Result Value Ref Range   Vitamin B-12 309 180 - 914 pg/mL    Comment: (NOTE) This assay is not validated for testing neonatal or myeloproliferative syndrome specimens for Vitamin B12 levels. Performed at [facility], [place] [address], [place], [place] [address]   Folate     Status: None   Collection Time: 01/13/22  5:56 PM  Result Value Ref Range   Folate 18.1 >5.9 ng/mL    Comment:  Performed at [facility], [place] [address] [place], [place] [address]  Hepatitis B surface antigen     Status: None   Collection Time: 01/13/22  9:26 PM  Result Value Ref Range   Hepatitis B Surface Ag NON  REACTIVE NON REACTIVE    Comment: Performed at [facility] [address], [place], [place] [address]  Hepatitis B surface antibody,qualitative     Status: Abnormal   Collection Time: 01/13/22  9:26 PM  Result Value Ref Range   Hep B S Ab Reactive (A) NON REACTIVE    Comment: (NOTE) Consistent with immunity, greater than 9.9 mIU/mL.  Performed at [facility], [place] [address], [place], [place] [address]   Hepatitis C antibody     Status: Abnormal   Collection Time: 01/13/22  9:26 PM  Result Value Ref Range   HCV Ab Reactive (A) NON REACTIVE    Comment: (NOTE) The CDC recommends that a Reactive HCV antibody result be followed up  with a HCV Nucleic Acid Amplification test.  Performed at [facility], [place] [address], [place], [place] [address]   Iron and TIBC     Status: None   Collection Time: 01/13/22  9:26 PM  Result Value Ref Range   Iron 97 45 - 182 ug/dL   TIBC 361 250 - 450 ug/dL   Saturation Ratios 27 17.9 - 39.5 %   UIBC 264 ug/dL    Comment: Performed at [facility], [place] [address] [place], [place] [address]  Ferritin     Status: None   Collection Time: 01/13/22  9:26 PM  Result Value Ref Range   Ferritin 60 24 - 336 ng/mL    Comment: Performed at [facility] [address], [place], [place] [address]  Comprehensive metabolic panel     Status: Abnormal   Collection Time: 01/14/22  2:54 AM  Result Value Ref Range   Sodium 139 135 - 145 mmol/L   Potassium 3.6 3.5 - 5.1 mmol/L   Chloride 97 (L) 98 - 111 mmol/L   CO2 33 (H) 22 - 32 mmol/L   Glucose, Bld 80 70 - 99 mg/dL    Comment: Glucose reference range applies only to samples taken after fasting for at least 8 hours.   BUN 9 6 - 20 mg/dL   Creatinine, Ser 1.32 (H) 0.61 - 1.24 mg/dL   Calcium 7.5 (L) 8.9 - 10.3 mg/dL   Total Protein 5.4 (L) 6.5 - 8.1 g/dL   Albumin 2.8 (L) 3.5 - 5.0 g/dL   AST 48 (H) 15 - 41 U/L   ALT 32 0 - 44 U/L   Alkaline Phosphatase 34 (L) 38 - 126 U/L    Total Bilirubin 0.5 0.3 - 1.2 mg/dL   GFR, Estimated >60 >60 mL/min    Comment: (NOTE) Calculated using the CKD-EPI Creatinine Equation (2021)    Anion gap 9 5 - 15    Comment: Performed at [facility], [place] [address], [place], [place] [address]  CBC     Status: Abnormal   Collection Time: 01/14/22  2:54 AM  Result Value Ref Range   WBC 2.7 (L) 4.0 - 10.5 K/uL   RBC 3.11 (L) 4.22 - 5.81 MIL/uL   Hemoglobin 9.8 (L) 13.0 - 17.0 g/dL   HCT 29.6 (L) 39.0 - 52.0 %   MCV 95.2 80.0 - 100.0 fL   MCH 31.5 26.0 - 34.0 pg  MCHC 33.1 30.0 - 36.0 g/dL   RDW 13.6 11.5 - 15.5 %   Platelets 161 150 - 400 K/uL   nRBC 0.0 0.0 - 0.2 %    Comment: Performed at [facility], [place] [address], [place], [place] [address]  VITAMIN D 25 Hydroxy (Vit-D Deficiency, Fractures)     Status: Abnormal   Collection Time: 01/14/22  2:54 AM  Result Value Ref Range   Vit D, 25-Hydroxy 19.37 (L) 30 - 100 ng/mL    Comment: (NOTE) Vitamin D deficiency has been defined by the [place] practice guideline as a level of serum 25-OH  vitamin D less than 20 ng/mL (1,2). The Endocrine Society went on to  further define vitamin D insufficiency as a level between 21 and 29  ng/mL (2).  1. IOM (Institute of Medicine). 2010. Dietary reference intakes for  calcium and D. [place]: The [employer]. 2. Holick MF, Binkley [place], Bischoff-Ferrari HA, et al. Evaluation,  treatment, and prevention of vitamin D deficiency: an Endocrine  Society clinical practice guideline, JCEM. 2011 Jul; 96(7): 1911-30.  Performed at [facility], [place] [address], [place], [place] [address]   T4, free     Status: Abnormal   Collection Time: 01/14/22  4:30 AM  Result Value Ref Range   Free T4 <0.25 (L) 0.61 - 1.12 ng/dL    Comment: RESULT REPEATED AND VERIFIED (NOTE) Biotin ingestion may interfere with free T4 tests. If the results are inconsistent with the TSH level,  previous test results, or the clinical presentation, then consider biotin interference. If needed, order repeat testing after stopping biotin. Performed at [facility], [place] [address], [place], [place] [address]     CT HEAD WO CONTRAST (5MM)  Result Date: 01/13/2022 CLINICAL DATA:  History of throat cancer with recent tracheostomy replacement 12/28/2021, oral swelling, weight gain, neurologic deficit EXAM: CT HEAD WITHOUT CONTRAST TECHNIQUE: Contiguous axial images were obtained from the base of the skull through the vertex without intravenous contrast. RADIATION DOSE REDUCTION: This exam was performed according to the departmental dose-optimization program which includes automated exposure control, adjustment of the mA and/or kV according to patient size and/or use of iterative reconstruction technique. COMPARISON:  05/09/2019 FINDINGS: Brain: No acute infarct or hemorrhage. Lateral ventricles and midline structures are unremarkable. No acute extra-axial fluid collections. No mass effect. Vascular: No hyperdense vessel or unexpected calcification. Skull: Normal. Negative for fracture or focal lesion. Sinuses/Orbits: No acute finding. Small metallic foreign body lateral to the right orbit may be related to piercing, and is stable. Other: None. IMPRESSION: 1. No acute intracranial process. Electronically Signed   By: Randa Ngo M.D.   On: 01/13/2022 18:37   CT Soft Tissue Neck W Contrast  Result Date: 01/13/2022 CLINICAL DATA:  Laryngeal edema previous throat cancer, trach, new tongue swelling, unable to open mouth more than two fingers worth EXAM: CT NECK WITH CONTRAST TECHNIQUE: Multidetector CT imaging of the neck was performed using the standard protocol following the bolus administration of intravenous contrast. RADIATION DOSE REDUCTION: This exam was performed according to the departmental dose-optimization program which includes automated exposure control, adjustment of the mA and/or  kV according to patient size and/or use of iterative reconstruction technique. CONTRAST:  36m OMNIPAQUE IOHEXOL 300 MG/ML  SOLN COMPARISON:  None Available. FINDINGS: Extensive postsurgical changes, including partial pharyngectomy, total laryngectomy, thyroidectomy, and base of tongue resection. Large flap within the region of resection.  There is soft tissue thickening and edema involving the residual floor mouth musculature, residual tongue and oropharynx there also is small volume of prevertebral fluid and soft tissue encasing the left carotid. Mild subcutaneous edema throughout the neck. Bilateral nodal dissections. No evidence of pathologically enlarged lymph nodes. Resection of the right submandibular gland. Left submandibular gland and parotid glands are unremarkable. Tracheostomy tube is partially imaged in the lower trachea. No consolidation in the visualized lung apices. No evidence of acute osseous abnormality. Major arteries are grossly patent in the neck. IMPRESSION: 1. Extensive postsurgical changes, including partial pharyngectomy, total laryngectomy, thyroidectomy, base of tongue resection, and nodal dissections. 2. Soft tissue thickening and edema involving the residual floor mouth musculature, tongue and oropharynx with small volume of prevertebral fluid, soft tissue encasing the left carotid, and mild diffuse subcutaneous edema. These findings could be secondary to any combination of post treatment change or superimposed infection. No discrete, drainable fluid collection. No clearly masslike/nodular soft tissue component to suggest recurrent malignancy, but evaluation is limited given no post treatment prior for comparison. Direct mucosal visualization and continue follow-up imaging is recommended. Electronically Signed   By: Margaretha Sheffield M.D.   On: 01/13/2022 15:00   DG Chest Portable 1 View  Result Date: 01/13/2022 CLINICAL DATA:  sob EXAM: PORTABLE CHEST 1 VIEW COMPARISON:  Chest x-ray  October 29, 2020. FINDINGS: Tracheostomy tube with tip approximately 4.9 cm above the carina. Clips at the thoracic inlet. Low lung volumes. Mild left basilar opacity. No visible pleural effusions or pneumothorax. Cardiomediastinal silhouette is accentuated by AP technique. IMPRESSION: Low lung volumes with mild left basilar opacity, which could represent atelectasis, aspiration, and/or pneumonia. Electronically Signed   By: Margaretha Sheffield M.D.   On: 01/13/2022 13:09    [id number] except as listed in admit H&P  Blood pressure 102/67, pulse 61, temperature 98.1 F (36.7 C), temperature source Oral, resp. rate 16, height '5\' 9"'  (1.753 m), weight 81.6 kg, SpO2 100 %.  PHYSICAL EXAM: Overall appearance:  Healthy appearing, in no distress.  He is aphonic. Head:  Normocephalic, atraumatic. Ears: External a ears look healthy. Nose: External nose is healthy in appearance. Internal nasal exam free of any lesions or obstruction. Oral Cavity/Pharynx:  There are no mucosal lesions or masses identified. Larynx/Hypopharynx: Deferred Neuro:  No identifiable neurologic deficits. Neck: No palpable neck masses.  He is little tender along the left side musculature but there is no mass palpable.  Surgical scars are nicely healed.  Stoma is widely patent and clear.  I was able to remove his tube very easily and replace it.  He can replace it himself as well.  His TEP prosthesis is in place.  Studies Reviewed: none  Procedures: Flexible fiberoptic examination.  The nose was topically applied with Afrin/Xylocaine spray on both sides.  A flexible scope was passed down the left nasal cavity.  The nasal cavity is clear.  The nasopharynx oropharynx and hypopharynx are clear.  The neopharynx is clear.  There is no signs of any tumors or masses.   Assessment/Plan: Stable, no obvious evidence of recurrent disease.  He will need ongoing follow-up over the next several years for surveillance for recurrent cancer.  He would  benefit with a new Lary tube.  The type he had was a 9/55 LARY tube.  It is possible that speech pathology at [facility] might be able to order this.  If not he may have to go back to [facility] speech pathology.  No other acute  needs to be met.  He is more comfortable with the tube in his stoma but he does not have to have a 24 hours a day.  He can put it in and out as he pleases.  He has a stable laryngectomy stoma which is not tube dependent as a standard tracheostomy is.  Call us if you have any additional questions.  C14.0 Z92.3   Medical Decision Making: #/Complex Problems: 4  Data Reviewed:4  Management:4 (1-Straightforward, 2-Low, 3-Moderate, 4-High)   Gregory Snyder 01/14/2022, 12:57 PM

## 2022-01-14 NOTE — Evaluation (Signed)
Speech Language Pathology Evaluation Patient Details Name: Gregory Snyder MRN: [id number] DOB: 03-11-1976 Today's Date: 01/14/2022 Time: 0272-5366 SLP Time Calculation (min) (ACUTE ONLY): 30 min  Problem List:  Patient Active Problem List   Diagnosis Date Noted   Cellulitis 01/13/2022   Ludwig's angina    Throat cancer ([place])    Tongue swelling    Past Medical History: History reviewed. No pertinent past medical history. Past Surgical History: History reviewed. No pertinent surgical history. HPI:  Pt is a 46 yo male presenting with pain and swelling in his floor of mouth and neck. CT Neck concerning for soft tissue infection and also showed extensive post-surgical changes (from May 2022), including partial pharyngectomy, total laryngectomy, thyroidectomy, base of tongue resection, and nodal dissections. Most recent SLP notes at [facility] are from August 2022, at which time pt had a larytube (10/55) and was using "home" HMEs. He was starting to use TEP (17 [place], 10 mm [employer] Vega +1 shim). On 12/28/21, pt lost his larytube and upon arrival to the ER, was given an #8 [place].   Assessment / Plan / Recommendation Clinical Impression  Pt was seen to facilitate communication given his h/o total laryngectomy in May of 2022. Per review of documentation, pt was utilizing a TEP as of August 2022, but that appears to be when he was lost to further follow up with SLP at [facility]. Pt says he was using a fenestrated larytube along with the TEP, but more recently had been using an electrolarynx and tablet as his primary means of communication. When he was taken into police custody on 12/01/01, he lost access to his larytube, HMEs, electrolarynx, and tablet. This was also when he was taken to the ED and had a trach placed. Pt reports that he has been taking his trach out completely to clean it, but that he doesn't have the supplies he normally uses at home (he believes all of these items, including aforementioned means of  alternative communication, are gone). Pt says his TEP is still present and he has been trying to clean it with a pipe cleaner. He does endorse the ability to achieve phonation speech when covering his stoma even over the past few weeks.   In the short term, think that pt would have difficulty using an electrolarynx given significantly limited ROM of his mandible and tongue especially on his R side. However, as this resolves, would likely benefit from replenished supplies. Will reach out to see if we can get any supplies for him while he is here. Will need to see what he can do for ongoing deliveries, particularly if he can use HMEs again. Reached out to ENT office to inform them about presence of old TEP and consideration of return to larytube/HMEs if can be obtained. In the meantime, will also f/u with pt wtih dry erase board and/or picture board, as he believes these may facilitate communication.    SLP Assessment  SLP Recommendation/Assessment: Patient needs continued Speech [facility] SLP Visit Diagnosis: Aphonia (R49.1)    Recommendations for follow up therapy are one component of a multi-disciplinary discharge planning process, led by the attending physician.  Recommendations may be updated based on patient status, additional functional criteria and insurance authorization.    Follow Up Recommendations   (SLP f/u PRN while incarcerated)    Assistance Recommended at Discharge  PRN  Functional Status Assessment Patient has had a recent decline in their functional status and demonstrates the ability to make significant improvements in  function in a reasonable and predictable amount of time.  Frequency and Duration min 2x/week  2 weeks      SLP Evaluation Cognition  Overall Cognitive Status: Within Functional Limits for tasks assessed       Comprehension  Auditory Comprehension Overall Auditory Comprehension: Appears within functional limits for tasks assessed     Expression Expression Primary Mode of Expression: Nonverbal - written Verbal Expression Overall Verbal Expression: Appears within functional limits for tasks assessed   Oral / Motor  Oral Motor/Sensory Function Overall Oral Motor/Sensory Function: Moderate impairment (reduced lingual and mandibular ROM on his R side) Motor Speech Overall Motor Speech: Impaired Phonation: Aphonic (s/p total laryngectomy)            Osie Bond., M.A. [place] Office [phone]  Secure chat preferred  01/14/2022, 10:14 AM

## 2022-01-14 NOTE — TOC Progression Note (Signed)
Transition of Care [facility]) - Initial/Assessment Note    Patient Details  Name: Gregory Snyder MRN: [id number] Date of Birth: 11-24-1975  [facility] CM/SW Contact:    Milinda Antis, [place] Phone Number: 01/14/2022, 10:12 AM  Clinical Narrative:                 TOC following patient for any d/c planning needs once medically stable.  Patient is from jail.       Lind Covert, MSW, LCSWA         Patient Goals and CMS Choice        Expected Discharge Plan and Services                                                Prior Living Arrangements/Services                       Activities of Daily Living      Permission Sought/Granted                  Emotional Assessment              Admission diagnosis:  Aspiration pneumonitis ([place]) [J69.0] Ludwig's angina [K12.2] Cellulitis [L03.90] Tongue swelling [R22.0] Throat cancer ([place]) [C14.0] Tracheostomy tube present ([place]) [Z93.0] Leukopenia, unspecified type [D72.819] Edema, unspecified type [R60.9] Patient Active Problem List   Diagnosis Date Noted   Cellulitis 01/13/2022   Ludwig's angina    Throat cancer ([place])    Tongue swelling    PCP:  System, Provider Not In Pharmacy:  No Pharmacies Listed    Social Determinants of Health (SDOH) Interventions    Readmission Risk Interventions     View : No data to display.

## 2022-01-14 NOTE — Progress Notes (Signed)
Subjective:  Patient was seen at bedside during rounds this morning.  He reports breathing well. Reports symptoms largely unchanged from yesterday including facial swelling, leg swelling, and pain. Reports still having right-sided facial numbness and bilateral finger numbness. Endorses neck soreness and swelling still. Feet are still hurting him, rates as 7/10. Reports blurry vision and right sided facial pain which both started today.  No other complaints or concerns today.  Objective:  Vital signs in last 24 hours: Vitals:   01/13/22 2032 01/13/22 2300 01/14/22 0037 01/14/22 0427  BP: 109/60 109/60 109/72 95/74  Pulse: 63 72 68 (!) 51  Resp: '16 20 20 19  '$ Temp: 98 F (36.7 C)  98 F (36.7 C) 98.3 F (36.8 C)  TempSrc:   Oral Oral  SpO2: 100% 100% 100% 98%  Weight:      Height:       General: NAD, nl appearance, lying comfortably in bed, pt is aphonic HE: Normocephalic, atraumatic, EOMI, Conjunctivae normal ENT: Tracheostomy is in place, mild erythema around the site but no signs of drainage. Poor dentition. Unable to fully open mouth due to significant swelling. No signs of infection in the mouth.  Neck is tender to palpation.  Woody induration of the neck is noted. Cardiovascular: Normal rate, regular rhythm. No murmurs, rubs, or gallops Pulmonary: Effort normal, breath sounds normal. No wheezes, rales, or rhonchi Abdominal: soft, nontender, bowel sounds present Musculoskeletal: no swelling, deformity, injury or tenderness in extremities Skin: Warm, dry, no bruising, erythema, or rash   Neurologic exam: Mental status: A&Ox3 Cranial Nerves:             II: PERRL             III, IV, VI: Extra-occular motions intact bilaterally             V, VII: Decreased sensation of right side of face, facial droop is present on the right side.              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  He has been afebrile and hemodynamically stable and is currently satting well on room air. Pt was breathing well on exam, but he is still unable to open his mouth fully due to significant swelling. CT soft tissue neck is concerning for soft tissue infection without a drainable fluid collection. ENT was consulted, and pt was started on abx in the ED. Will continue to monitor his respiratory status closely.  -Appreciate ENT recommendations -Zosyn per pharmacy, appreciate their assistance -Continue IV Linezolid 600 mg q12h -Trend CBC   Peripheral neuropathy Hypothyroidism Hypocalcemia The patient presents here with a ~ 2 week history of loss of balance with ambulation as well as numbness and tingling of the right side of his face as well as the distal extremities. 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  Vitamin B12 and folate are within normal limits, negative HIV antibody.  TSH is 98.4, free T4 is < 0.25.  Calcium  levels are low, most recently 7.5.  Suspect his symptoms (peripheral neuropathy, facial numbness, leg cramping) are due to combination of his severe hypothyroidism and hypocalcemia from after his surgery. -Levothyroxine 131 mcg daily -Parathyroid hormone pending  Leukopenia Presented here with a WBC of 2.3. Review of records shows that his WBC were decreased in 2022.  HIV antibody is negative. Could be 2/2 his radiation therapy for cancer tx vs hepatitis infection. -HCV RNA quant reflex/genotype pending -Peripheral smear is pending -CBC  Normocytic anemia Hemoglobin on admit of 9.8. MCV 98.1.  Hemoglobin ~10-11 in August 2022. No overt signs of bleeding. Reticulocyte labs reflect a hypoproliferative state. Differential diagnosis includes anemia of chronic disease in the setting of longstanding infection. -Trend CBC -Peripheral smear is pending  Hepatitis B/C, untreated Has a history of previously untreated hepatitis B/C.  Per review of records from August 2022, a GI/hepatology referral was considered as outpatient, however cannot locate any GI consult records. Labs here reveal a nonreactive hep B surface antigen, reactive hep B surface antibody, indicating immunity likely from his prior infection.  Hepatitis C antibody is reactive, will obtain RNA for further workup.  CMP today reveals mildly elevated AST to 48, normal ALT 32. -HCV RNA quant reflex/genotype and pending  Prior to Admission Living Arrangement: Currently incarcerated at [facility] Anticipated Discharge Location: [facility] Barriers to Discharge: Medical stability and work-up Dispo: Anticipated discharge pending IV antibiotic therapy, medical work-up  Orvis Brill, MD 01/14/2022, 7:33 AM Pager: [phone]  After 5pm on weekdays and 1pm on weekends: On Call pager [phone]

## 2022-01-14 NOTE — Progress Notes (Signed)
Speech Language Pathology Treatment: Cognitive-Linquistic  Patient Details Name: Gregory Snyder MRN: [id number] DOB: May 29, 1976 Today's Date: 01/14/2022 Time: 1350-1407 SLP Time Calculation (min) (ACUTE ONLY): 17 min  Assessment / Plan / Recommendation Clinical Impression  SLP returned to see pt now that he has his pulmonary kit. Pt already had his electrolarynx and tablet out for communication purposes. He says that he is having a more difficult time using his electrolarynx, but encouraged him to keep trying as his swelling subsides and ROM returns, at which point he can hopefully use it as he normally would. A lot of the session was spent in care planning with the patient to try to facilitate his ability to receive supplies to maintain a clean airway and communicate with Mod I upon discharge. Officer in room also assisting in planning - note that he says a letter will need to be written to explain what he needs for communication, what supplies he needs for cleaning, and why. Pt considered having supplies sent to his stepmother, but upon further consideration, realized that she lives too far away to reliably provide him with his HMEs. SLP reached out to case manager as well to try to help with discharge planning. Reached out to [place] as well, who provides his supplies PTA, and will f/u as needed. Will return as able to work on replacement of larytube and HMEs.    HPI HPI: Pt is a 46 yo male presenting with pain and swelling in his floor of mouth and neck. CT Neck concerning for soft tissue infection and also showed extensive post-surgical changes (from May 2022), including partial pharyngectomy, total laryngectomy, thyroidectomy, base of tongue resection, and nodal dissections. Most recent SLP notes at [facility] are from August 2022, at which time pt had a larytube (10/55) and was using "home" HMEs. He was starting to use TEP (17 [place], 10 mm Provox Vega +1 shim). On 12/28/21, pt lost his larytube and upon arrival  to the ER, was given an #8 Shiley.      SLP Plan  Continue with current plan of care      Recommendations for follow up therapy are one component of a multi-disciplinary discharge planning process, led by the attending physician.  Recommendations may be updated based on patient status, additional functional criteria and insurance authorization.    Recommendations                   Follow Up Recommendations: Outpatient SLP Assistance recommended at discharge: PRN SLP Visit Diagnosis: Aphonia (R49.1) Plan: Continue with current plan of care           Osie Bond., M.A. [place] Office [phone]  Secure chat preferred   01/14/2022, 2:54 PM

## 2022-01-14 NOTE — Progress Notes (Signed)
Initial Nutrition Assessment  DOCUMENTATION CODES:   Not applicable  INTERVENTION:   Continue Regular diet -Assistance with ordering meals; RD discussed with Officer at bedside Mellody Memos); given swallowing issues, nutrition concerns, pt may order his meals (no restrictions) so that pt may select items that are easiest for him to swallow. Pt is allowed to order items such as pudding, ice cream etc. Note made in [facility] Epic and Health Touch System  Ensure Enlive po QID, each supplement provides 350 kcal and 20 grams of protein.    NUTRITION DIAGNOSIS:   Increased nutrient needs related to acute illness, chronic illness as evidenced by estimated needs.   GOAL:   Patient will meet greater than or equal to 90% of their needs  MONITOR:   PO intake, Supplement acceptance, Labs, Weight trends  REASON FOR ASSESSMENT:   Consult Assessment of nutrition requirement/status  ASSESSMENT:   46 yo male admitted with ludwig angina Pt with hx of squamous cell carcinoma of head and neck s/p cervical lymphadenectomy with modified radical neck dissection and trach. Pt is currently incarcerated at [facility]; police officer at bedside.  CT concern for soft tissue infection with drainable fluid collection. Started on antibiotics  Pt alert, just finished lunch on visit today. Pt with total laryngectomy, writing on board and mouthing words on visit today. Able to shake head yes and no  SLP following, pt on Regular diet  Pt still unable to open his mouth completely due to swelling Pt reports some swallowing difficulties; he does report that things like pudding, ice cream, yogurt, etc are easiest to swallow. Pt was able to eat his pork loin today but has to be careful and chew very well prior to swallowing.   RD assisted pt with order dinner tonight and all meals for tomorrow.   Appetite is good, pt eating 75% of meals.   Pt reports MD has him taking 4 Ensure supplements per day as  outpatient. Pt would like to continue this while in patient. RD to order.   Pt reports some weight gain PTA which he is happy with. No recent weight in chart.   Labs: reviewed Meds: folic acid, thiamine    Diet Order:   Diet Order             Diet regular Room service appropriate? Yes; Fluid consistency: Thin  Diet effective now                   EDUCATION NEEDS:   Education needs have been addressed  Skin:  Skin Assessment: Reviewed RN Assessment  Last BM:  PTA  Height:   Ht Readings from Last 1 Encounters:  01/13/22 '5\' 9"'$  (1.753 m)    Weight:   Wt Readings from Last 1 Encounters:  01/13/22 81.6 kg    BMI:  Body mass index is 26.58 kg/m.  Estimated Nutritional Needs:   Kcal:  2300-2500 kcals  Protein:  125-140 g  Fluid:  >/= 2 L   Kerman Passey MS, RDN, LDN, CNSC Registered Dietitian III Clinical Nutrition RD Pager and On-Call Pager Number Located in [place]

## 2022-01-15 LAB — CBC WITH DIFFERENTIAL/PLATELET
Abs Immature Granulocytes: 0.03 10*3/uL (ref 0.00–0.07)
Basophils Absolute: 0 10*3/uL (ref 0.0–0.1)
Basophils Relative: 0 %
Eosinophils Absolute: 0 10*3/uL (ref 0.0–0.5)
Eosinophils Relative: 2 %
HCT: 29.2 % — ABNORMAL LOW (ref 39.0–52.0)
Hemoglobin: 9.9 g/dL — ABNORMAL LOW (ref 13.0–17.0)
Immature Granulocytes: 1 %
Lymphocytes Relative: 13 %
Lymphs Abs: 0.3 10*3/uL — ABNORMAL LOW (ref 0.7–4.0)
MCH: 32.4 pg (ref 26.0–34.0)
MCHC: 33.9 g/dL (ref 30.0–36.0)
MCV: 95.4 fL (ref 80.0–100.0)
Monocytes Absolute: 0.2 10*3/uL (ref 0.1–1.0)
Monocytes Relative: 10 %
Neutro Abs: 1.8 10*3/uL (ref 1.7–7.7)
Neutrophils Relative %: 74 %
Platelets: 182 10*3/uL (ref 150–400)
RBC: 3.06 MIL/uL — ABNORMAL LOW (ref 4.22–5.81)
RDW: 13.6 % (ref 11.5–15.5)
WBC: 2.4 10*3/uL — ABNORMAL LOW (ref 4.0–10.5)
nRBC: 0 % (ref 0.0–0.2)

## 2022-01-15 LAB — BASIC METABOLIC PANEL
Anion gap: 10 (ref 5–15)
BUN: 15 mg/dL (ref 6–20)
CO2: 32 mmol/L (ref 22–32)
Calcium: 8.2 mg/dL — ABNORMAL LOW (ref 8.9–10.3)
Chloride: 98 mmol/L (ref 98–111)
Creatinine, Ser: 1.18 mg/dL (ref 0.61–1.24)
GFR, Estimated: >60 mL/min (ref >60)
Glucose, Bld: 99 mg/dL (ref 70–99)
Potassium: 3.9 mmol/L (ref 3.5–5.1)
Sodium: 140 mmol/L (ref 135–145)

## 2022-01-15 LAB — CBC
HCT: 28.8 % — ABNORMAL LOW (ref 39.0–52.0)
Hemoglobin: 9.8 g/dL — ABNORMAL LOW (ref 13.0–17.0)
MCH: 32.1 pg (ref 26.0–34.0)
MCHC: 34 g/dL (ref 30.0–36.0)
MCV: 94.4 fL (ref 80.0–100.0)
Platelets: 171 10*3/uL (ref 150–400)
RBC: 3.05 MIL/uL — ABNORMAL LOW (ref 4.22–5.81)
RDW: 13.9 % (ref 11.5–15.5)
WBC: 2.5 10*3/uL — ABNORMAL LOW (ref 4.0–10.5)
nRBC: 0 % (ref 0.0–0.2)

## 2022-01-15 LAB — PARATHYROID HORMONE, INTACT (NO CA): PTH: 10 pg/mL — ABNORMAL LOW (ref 15–65)

## 2022-01-15 MED ORDER — METHYLPREDNISOLONE 4 MG PO TBPK
4.0000 mg | ORAL_TABLET | ORAL | Status: AC
  Administered 2022-01-15: 4 mg via ORAL
  Filled 2022-01-15: qty 21

## 2022-01-15 MED ORDER — METHYLPREDNISOLONE 4 MG PO TBPK
4.0000 mg | ORAL_TABLET | Freq: Four times a day (QID) | ORAL | Status: AC
  Administered 2022-01-17 – 2022-01-20 (×10): 4 mg via ORAL

## 2022-01-15 MED ORDER — GABAPENTIN 100 MG PO CAPS
100.0000 mg | ORAL_CAPSULE | Freq: Once | ORAL | Status: AC
  Administered 2022-01-15: 100 mg via ORAL
  Filled 2022-01-15: qty 1

## 2022-01-15 MED ORDER — METHYLPREDNISOLONE 4 MG PO TBPK
4.0000 mg | ORAL_TABLET | ORAL | Status: AC
  Administered 2022-01-15: 4 mg via ORAL

## 2022-01-15 MED ORDER — METHYLPREDNISOLONE 4 MG PO TBPK
8.0000 mg | ORAL_TABLET | Freq: Every evening | ORAL | Status: AC
  Administered 2022-01-16: 8 mg via ORAL

## 2022-01-15 MED ORDER — METHYLPREDNISOLONE 4 MG PO TBPK
8.0000 mg | ORAL_TABLET | Freq: Every evening | ORAL | Status: AC
  Administered 2022-01-15: 8 mg via ORAL

## 2022-01-15 MED ORDER — METHOCARBAMOL 500 MG PO TABS
1000.0000 mg | ORAL_TABLET | Freq: Three times a day (TID) | ORAL | Status: DC
  Administered 2022-01-15 – 2022-01-17 (×6): 1000 mg via ORAL
  Filled 2022-01-15 (×6): qty 2

## 2022-01-15 MED ORDER — METHYLPREDNISOLONE 4 MG PO TBPK
8.0000 mg | ORAL_TABLET | Freq: Every morning | ORAL | Status: AC
  Administered 2022-01-15: 8 mg via ORAL
  Filled 2022-01-15: qty 21

## 2022-01-15 MED ORDER — METHYLPREDNISOLONE 4 MG PO TBPK
4.0000 mg | ORAL_TABLET | Freq: Three times a day (TID) | ORAL | Status: AC
Start: 2022-01-16 — End: 2022-01-16
  Administered 2022-01-16 (×3): 4 mg via ORAL

## 2022-01-15 NOTE — Progress Notes (Deleted)
Pt off the unit to hemodialysis  

## 2022-01-15 NOTE — Progress Notes (Signed)
Mobility Specialist: Progress Note   01/15/22 1637  Mobility  Activity Ambulated with assistance in hallway  Level of Assistance Contact guard assist, steadying assist  Assistive Device Front wheel walker  Distance Ambulated (ft) 60 ft  Activity Response Tolerated fair  $Mobility charge 1 Mobility   Pt received in the bed and agreeable to ambulation. C/o tightness and numbness in his hands and BLE during session. C/o mild dizziness when returning to the room as well. Pt back to bed after session with call bell at his side.   [facility] Abaigeal [place] Mobility Specialist Mobility Specialist 5 North: [phone] Mobility Specialist 6 North: [phone]

## 2022-01-15 NOTE — Progress Notes (Signed)
Pt c/o left lower leg pain at 7/10. Too soon for Tylenol. RN messaged MD on call.   Foster Simpson Dustine Bertini

## 2022-01-15 NOTE — Progress Notes (Signed)
Subjective:  Patient was seen at bedside during rounds this morning.  He states that the swelling in his neck has not improved much since he was first admitted.  Also continues to endorse lower leg cramping and states that the numbness/tingling have not improved.  No other complaints or concerns today.  Objective:  Vital signs in last 24 hours: Vitals:   01/14/22 2204 01/14/22 2351 01/15/22 0358 01/15/22 0545  BP: 102/61   97/65  Pulse: 60 65 (!) 59 (!) 52  Resp: '16 18 18 18  '$ Temp: 98.6 F (37 C)   97.8 F (36.6 C)  TempSrc: Oral   Oral  SpO2: 99% 100% 100% 99%  Weight: 85.3 kg     Height:       General: NAD, nl appearance, lying comfortably in bed, pt is aphonic HE: Normocephalic, atraumatic, EOMI, Conjunctivae normal ENT: Tracheostomy is in place, mild erythema around the site but no signs of drainage. Poor dentition. Unable to fully open mouth due to significant swelling. No signs of infection in the mouth.  Neck is mildly tender to palpation.  Cardiovascular: Normal rate, regular rhythm. No murmurs, rubs, or gallops Pulmonary: Effort normal, breath sounds normal. No wheezes, rales, or rhonchi Abdominal: soft, nontender, bowel sounds present Musculoskeletal: no swelling, deformity, injury or tenderness in extremities Skin: Warm, dry, no bruising, erythema, or rash   Neurologic exam: Mental status: A&Ox3 Cranial Nerves:             II: PERRL             III, IV, VI: Extra-occular motions intact bilaterally             V, VII: Decreased sensation of right side of face, mild facial droop is present on the right side.              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  He has been afebrile and hemodynamically stable and is currently satting well on room air. Pt was breathing well on exam, but he is still unable to open his mouth fully due to significant swelling. CT soft tissue neck is concerning for soft tissue infection without a drainable fluid collection. ENT was consulted, and pt was started on abx in the ED. Continue antibiotics for now, but given persistent swelling in his neck, discussed with ENT and will also give some steroids to see if this helps with his swelling.  Will continue to monitor his respiratory status closely.  -Appreciate ENT recommendations -Zosyn per pharmacy, appreciate their assistance -Continue IV Linezolid 600 mg q12h -Scheduled methylprednisone -Trend CBC   Peripheral neuropathy Hypothyroidism Hypocalcemia The patient presents here with a ~ 2 week history of loss of balance with ambulation as well as numbness and tingling of the right side of his face as well as the distal extremities. 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  Vitamin B12 and folate are within normal limits, negative HIV antibody.  TSH is 98.4, free T4  is < 0.25.  Calcium levels are low, most recently at 7.5.  Suspect his symptoms (peripheral neuropathy, facial numbness, leg cramping) are due to combination of his severe hypothyroidism and hypocalcemia from after his surgery.  -Levothyroxine 131 mcg daily -Parathyroid hormone pending -Added robaxin for 3 days  Leukopenia Presented here with a WBC of 2.3. Review of records shows that his WBC were decreased in 2022.  HIV antibody is negative. Could be 2/2 his radiation therapy for cancer tx vs hepatitis infection.  -HCV RNA quant reflex/genotype pending -Peripheral smear is pending -CBC  Normocytic anemia Hemoglobin on admit of 9.8. MCV 98.1.  Hemoglobin ~10-11 in August 2022. No overt signs of bleeding. Reticulocyte labs reflect a hypoproliferative state. Differential diagnosis includes anemia of chronic disease in the setting of longstanding infection. -Trend CBC -Peripheral smear is pending  Hepatitis B/C, untreated Has a history of previously untreated hepatitis B/C.  Per review of records from August 2022, a GI/hepatology referral was considered as outpatient, however cannot locate any GI consult records. Labs here reveal a nonreactive hep B surface antigen, reactive hep B surface antibody, indicating immunity likely from his prior infection.  Hepatitis C antibody is reactive, will obtain RNA for further workup.  CMP reveals mildly elevated AST to 48, normal ALT 32. -HCV RNA quant reflex/genotype and pending  Prior to Admission Living Arrangement: Currently incarcerated at [facility] Anticipated Discharge Location: [facility] Barriers to Discharge: Medical stability and work-up Dispo: Anticipated discharge pending IV antibiotic and steroid therapy, medical work-up  Orvis Brill, MD 01/15/2022, 6:59 AM Pager: [phone]  After 5pm on weekdays and 1pm on weekends: On Call pager [phone]

## 2022-01-15 NOTE — Progress Notes (Signed)
Paged for left lower leg pain. Patient assessed at bedside. He writes that the pain starts in his back and travels down to lower leg. Pain started yesterday. He has had similar pain in the past and used to be on gabapentin for this. Physical exam limited by shackles, no erythema or edema present in lower extremities bilaterally. No focal tenderness to palpation. A: Pain appears to be neuropathic in nature. I do not see that he has this on his home medication list. Creatinine function has normalized. P: Gabapentin 100 mg once

## 2022-01-15 NOTE — TOC Initial Note (Signed)
Transition of Care [facility]) - Initial/Assessment Note    Patient Details  Name: Gregory Snyder MRN: [id number] Date of Birth: [birth year]  Transition of [facility]) CM/SW Contact:    Verdell Carmine, RN Phone Number: 01/15/2022, 3:35 PM  Clinical Narrative:                 [facility] for increase in facial swelling, Cellulitis. Had recent Radical neck and laryngectomy for Malignancy. Has a tracheostomy. Patient is still feeling swollen, has been on IV abx, will be receiving steroids  When patient is discharged please call [phone] press "8"  and ask for a nurse to give report to.   .   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: Artist                     Activities of Daily Living      Permission Sought/Granted                  Emotional Assessment Appearance:: Appears stated age     Orientation: : Oriented to Self, Oriented to Place, Oriented to  Time Alcohol / Substance Use: Not Applicable Psych Involvement: No (comment)  Admission diagnosis:  Aspiration pneumonitis ([place]) [J69.0] Ludwig's angina [K12.2] Cellulitis [L03.90] Tongue swelling [R22.0] Throat cancer (HCC) [C14.0] Tracheostomy tube present ([place]) [Z93.0] Leukopenia, unspecified type [D72.819] Edema, unspecified type [R60.9] Patient Active Problem List   Diagnosis Date Noted   Cellulitis 01/13/2022   Ludwig's angina    Throat cancer ([place])    Tongue swelling    PCP:  System, Provider Not In Pharmacy:  No Pharmacies Listed    Social Determinants of  Health (SDOH) Interventions    Readmission Risk Interventions     View : No data to display.

## 2022-01-15 NOTE — Progress Notes (Signed)
Speech Language Pathology Treatment: Cognitive-Linquistic  Patient Details Name: Gregory Snyder MRN: [id number] DOB: 05/17/1976 Today's Date: 01/15/2022 Time: 1330-1430 SLP Time Calculation (min) (ACUTE ONLY): 60 min  Assessment / Plan / Recommendation Clinical Impression  Pt was seen for laryngectomy/stoma care. Pt is still using his writing tablet as opposed to his electrolarynx due to facial swelling and reduced oral ROM, but communicating functionally. Shiley trach was removed just before SLP arrival, so SLP was able to visualize into his stoma, which had mild, thick secretions on his L side that were removed. Prosthesis was well visualized but he could not phonate through it with stoma occlusion. Pt was given a brush from the trach care kit to clean his prosthesis, after which he had some speech but with significant effort. Pt denies any recent leaking through or around his TEP, so he may be able to have functional use out of it if he can more fully clean it. He is likely due for follow up care as his current prosthesis has been in place for a year now. Encouraged him to schedule routine follow up as he is able to do so, but more urgently if he is not able to phonate or if any leaking or dislodgment is noted. Pt replaced his own lary tube (9/55) with Home HME. He also donned/doffed HME independently.   SLP and Sarah Abrams also continue to coordinate whatever care items he may need upon return to [facility]. Letter of necessity drafted by SLP, who also spoke with nurse from medical ward at the jail, who said they can order suction and yankauer for him. They said they will need a list of all other supplies. SLP compiled a list of these supplies, education about their use, and rationale for medical necessity to go back to the jail with him. SLP continues to work with [employer] to change address for future deliveries, so will send pt with extra care items at the time of discharge. Will continue to  follow acutely.    HPI HPI: Pt is a 45 yo male presenting with pain and swelling in his floor of mouth and neck. CT Neck concerning for soft tissue infection and also showed extensive post-surgical changes (from May 2022), including partial pharyngectomy, total laryngectomy, thyroidectomy, base of tongue resection, and nodal dissections. Most recent SLP notes at [facility] are from August 2022, at which time pt had a larytube (10/55) and was using "home" HMEs. He was starting to use TEP (17 [place], 10 mm Provox Vega +1 shim). On 12/28/21, pt lost his larytube and upon arrival to the ER, was given an #8 Shiley.      SLP Plan  Continue with current plan of care      Recommendations for follow up therapy are one component of a multi-disciplinary discharge planning process, led by the attending physician.  Recommendations may be updated based on patient status, additional functional criteria and insurance authorization.    Recommendations                   Follow Up Recommendations: Outpatient SLP Assistance recommended at discharge: PRN SLP Visit Diagnosis: Aphonia (R49.1) Plan: Continue with current plan of care           Laura N., M.A. CCC-SLP Acute Rehabilitation Services Office [phone]  Secure chat preferred   01/15/2022, 3:29 PM 

## 2022-01-16 DIAGNOSIS — R609 Edema, unspecified: Secondary | ICD-10-CM

## 2022-01-16 DIAGNOSIS — F17210 Nicotine dependence, cigarettes, uncomplicated: Secondary | ICD-10-CM

## 2022-01-16 DIAGNOSIS — L03221 Cellulitis of neck: Secondary | ICD-10-CM

## 2022-01-16 DIAGNOSIS — J690 Pneumonitis due to inhalation of food and vomit: Secondary | ICD-10-CM

## 2022-01-16 LAB — CBC
HCT: 29.2 % — ABNORMAL LOW (ref 39.0–52.0)
Hemoglobin: 9.6 g/dL — ABNORMAL LOW (ref 13.0–17.0)
MCH: 31.8 pg (ref 26.0–34.0)
MCHC: 32.9 g/dL (ref 30.0–36.0)
MCV: 96.7 fL (ref 80.0–100.0)
Platelets: 184 10*3/uL (ref 150–400)
RBC: 3.02 MIL/uL — ABNORMAL LOW (ref 4.22–5.81)
RDW: 14 % (ref 11.5–15.5)
WBC: 4.7 10*3/uL (ref 4.0–10.5)
nRBC: 0 % (ref 0.0–0.2)

## 2022-01-16 LAB — BASIC METABOLIC PANEL
Anion gap: 8 (ref 5–15)
BUN: 15 mg/dL (ref 6–20)
CO2: 33 mmol/L — ABNORMAL HIGH (ref 22–32)
Calcium: 8.4 mg/dL — ABNORMAL LOW (ref 8.9–10.3)
Chloride: 97 mmol/L — ABNORMAL LOW (ref 98–111)
Creatinine, Ser: 1.23 mg/dL (ref 0.61–1.24)
GFR, Estimated: >60 mL/min (ref >60)
Glucose, Bld: 137 mg/dL — ABNORMAL HIGH (ref 70–99)
Potassium: 4.3 mmol/L (ref 3.5–5.1)
Sodium: 138 mmol/L (ref 135–145)

## 2022-01-16 MED ORDER — GABAPENTIN 600 MG PO TABS
300.0000 mg | ORAL_TABLET | Freq: Every day | ORAL | Status: DC
  Administered 2022-01-16 – 2022-01-21 (×6): 300 mg via ORAL
  Filled 2022-01-16 (×6): qty 1

## 2022-01-16 NOTE — Progress Notes (Signed)
Pharmacy Antibiotic Note  Gregory Snyder is a 46 y.o. male for which pharmacy has been consulted for zosyn dosing for  ludwig's angina .  Patient is immunocompromised with h/x of squamous cell carcinoma s/p head and neck surgery and radiation. He has a tracheostomy with trach change 12/29/21. He is followed by [facility] ENT specialist there. IMTS  (Dr. Johnnye Sima attending) started Linezolid on 01/13/22 PM,  citing concern for toxin-production in the setting of clindamycin shortage.    Linezolid  reviewed by Jimmy Footman, PharmD-ID on 01/15/22- okay to continue.   WBC 2.3>2.7>2.5>4.7, afebrile   Scr 1.01>1.32>1.23    Plan: Continue Zosyn 3.375g IV q8h (4 hour infusion)  Trend WBC, Fever, Renal function, & Clinical course   Height: '5\' 9"'$  (175.3 cm) Weight: 85.3 kg (188 lb 0.8 oz) IBW/kg (Calculated) : 70.7  Temp (24hrs), Avg:97.8 F (36.6 C), Min:97.5 F (36.4 C), Max:98.1 F (36.7 C)  Recent Labs  Lab 01/13/22 1208 01/13/22 1756 01/14/22 0254 01/15/22 0345 01/15/22 1138 01/16/22 0934  WBC 2.3*  --  2.7* 2.4*  2.5*  --  4.7  CREATININE 1.01 1.01 1.32*  --  1.18 1.23     Estimated Creatinine Clearance: 82.1 mL/min (by C-G formula based on SCr of 1.23 mg/dL).    Allergies  Allergen Reactions   Motrin [Ibuprofen] Hives   Antimicrobials this admission: linezolid 5/17 >>  zosyn 5/17 >>  Microbiology results: Pending  Thank you for allowing pharmacy to be a part of this patient's care.  Lorelei Pont, PharmD, BCPS 01/16/2022 3:59 PM ED Clinical Pharmacist -  [phone]

## 2022-01-16 NOTE — Evaluation (Signed)
Physical Therapy Evaluation  Patient Details Name: Gregory Snyder MRN: [id number] DOB: 1975/11/19 Today's Date: 01/16/2022  History of Present Illness  Pt is a 46 y/o male who presents with tongue and throat swelling, LE swelling with a 33 pound weight gain in the last 15 days PTA. 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   Clinical Impression  Pt admitted with above diagnosis. Pt currently with functional limitations due to the deficits listed below (see PT Problem List). At the time of PT eval pt was able to perform transfers and ambulation with gross min guard assist and RW for support. Pt endorses dizziness, stating the room is spinning during ambulation. However, no unsteadiness/LOB and no nystagmus noted. Overall tolerance for functional activity is decreased, and pt will benefit from skilled PT to increase their independence and safety with mobility to allow discharge to the venue listed below.          Recommendations for follow up therapy are one component of a multi-disciplinary discharge planning process, led by the attending physician.  Recommendations may be updated based on patient status, additional functional criteria and insurance authorization.  Follow Up Recommendations Outpatient PT    Assistance Recommended at Discharge Intermittent Supervision/Assistance  Patient can return home with the following  A little help with walking and/or transfers;A little help with bathing/dressing/bathroom;Assistance with cooking/housework;Assist for transportation;Help with stairs or ramp for entrance    Equipment Recommendations Rolling walker (2 wheels)  Recommendations for Other Services       Functional Status Assessment Patient has had a recent decline in their functional status and demonstrates the ability to make significant improvements in function in a reasonable  and predictable amount of time.     Precautions / Restrictions Precautions Precautions: Fall Precaution Comments: Trach; Pt not allowed to sit in the chair per officer's report. Must be in the bed. Restrictions Weight Bearing Restrictions: No      Mobility  Bed Mobility Overal bed mobility: Modified Independent Bed Mobility: Supine to Sit, Sit to Supine           General bed mobility comments: No assist required for transition to/from EOB. Increased time but able to complete well.    Transfers Overall transfer level: Modified independent Equipment used: Rolling walker (2 wheels) Transfers: Sit to/from Stand, Bed to chair/wheelchair/BSC             General transfer comment: No assist required. Pt with quick power up to full stand and without unsteadiness or LOB. Pt easily negotiated from chair>bed at end of session.    Ambulation/Gait Ambulation/Gait assistance: Min guard Gait Distance (Feet): 200 Feet Assistive device: Rolling walker (2 wheels) Gait Pattern/deviations: Step-through pattern, Decreased stride length, Trunk flexed Gait velocity: Decreased Gait velocity interpretation: <1.31 ft/sec, indicative of household ambulator   General Gait Details: Slow and guarded, however may be due to shackles as well. Pt endoreses LE pain and dizziness. When asked to clarify dizziness pt reports room is spinning. No overt LOB noted, and no nystagmus observed.  Stairs            Wheelchair Mobility    Modified Rankin (Stroke Patients Only)       Balance Overall balance assessment: Needs assistance Sitting-balance support: Feet supported, No upper extremity supported Sitting balance-Leahy Scale: Good     Standing balance support: During functional activity, Bilateral upper extremity supported, Reliant on assistive device for balance  Standing balance-Leahy Scale: Poor Standing balance comment: Reliant on RW, however no overt LOB noted.                              Pertinent Vitals/Pain Pain Assessment Pain Assessment: Faces Faces Pain Scale: No hurt Pain Location: hands, feet, legs; also R side of face Pain Descriptors / Indicators: Sore    Home Living Family/patient expects to be discharged to:: [facility]                        Prior Function Prior Level of Function : Independent/Modified Independent             Mobility Comments: Not utilizing an AD       Hand Dominance        Extremity/Trunk Assessment   Upper Extremity Assessment Upper Extremity Assessment: Overall WFL for tasks assessed    Lower Extremity Assessment Lower Extremity Assessment: Generalized weakness (Decreased muscular endurance. Difficult to assess due to shackles.)    Cervical / Trunk Assessment Cervical / Trunk Assessment: Normal  Communication   Communication: Other (comment) (Has an electrolayrnx hanging around his neck but did not attempt to use during session. Not sure if it needs to be charged vs difficult to use 2 neck swelling.)  Cognition Arousal/Alertness: Awake/alert Behavior During Therapy: WFL for tasks assessed/performed Overall Cognitive Status: Within Functional Limits for tasks assessed                                          General Comments      Exercises     Assessment/Plan    PT Assessment Patient needs continued PT services  PT Problem List Decreased strength;Decreased activity tolerance;Decreased balance;Decreased mobility;Decreased knowledge of use of DME;Decreased safety awareness;Decreased knowledge of precautions;Pain       PT Treatment Interventions DME instruction;Gait training;Functional mobility training;Therapeutic activities;Therapeutic exercise;Patient/family education    PT Goals (Current goals can be found in the Care Plan section)  Acute Rehab PT Goals Patient Stated Goal: None stated PT Goal Formulation: With patient Time For Goal Achievement:  01/30/22 Potential to Achieve Goals: Good    Frequency Min 3X/week     Co-evaluation               AM-PAC PT "6 Clicks" Mobility  Outcome Measure Help needed turning from your back to your side while in a flat bed without using bedrails?: None Help needed moving from lying on your back to sitting on the side of a flat bed without using bedrails?: None Help needed moving to and from a bed to a chair (including a wheelchair)?: A Little Help needed standing up from a chair using your arms (e.g., wheelchair or bedside chair)?: None Help needed to walk in hospital room?: A Little Help needed climbing 3-5 steps with a railing? : A Little 6 Click Score: 21    End of Session Equipment Utilized During Treatment: Gait belt Activity Tolerance: Patient tolerated treatment well Patient left: in bed;with call bell/phone within reach;Other (comment) Nurse Communication: Mobility status PT Visit Diagnosis: Unsteadiness on feet (R26.81);Difficulty in walking, not elsewhere classified (R26.2)    Time: 3662-9476 PT Time Calculation (min) (ACUTE ONLY): 23 min   Charges:   PT Evaluation $PT Eval Moderate Complexity: 1 Mod PT Treatments $Gait Training: 8-22 mins  Rolinda Roan, PT, DPT Acute Rehabilitation Services Secure Chat Preferred Office: [phone]   Thelma Comp 01/16/2022, 2:04 PM

## 2022-01-16 NOTE — Progress Notes (Signed)
Pt wrote on his board that the Gabapentin helped last night and he would like a dose for this am. Order was for a one time dose. RN messaged MD on call to see if med can be scheduled. RN awaiting response.   Gregory Snyder

## 2022-01-16 NOTE — Progress Notes (Signed)
Subjective:  Patient was seen at bedside during rounds this morning.  He states that the swelling in his neck is about the same as yesterday. He is able to swallow without difficulty.  He did have some radicular pain overnight and continues to have some numbness and tingling in the lower extremities. He states that this improved with gabapentin. Otherwise no acute issues.   Objective:  Vital signs in last 24 hours: Vitals:   01/16/22 0512 01/16/22 0828 01/16/22 0833 01/16/22 1146  BP: (!) 99/54 (!) 94/59    Pulse: 63 61 64 65  Resp: '18 16 16 16  '$ Temp: 98.1 F (36.7 C) (!) 97.5 F (36.4 C)    TempSrc: Oral Oral    SpO2: 100% 100% 100% 98%  Weight:      Height:       General: NAD, nl appearance, lying comfortably in bed, pt is aphonic HE: Normocephalic, atraumatic, EOMI, Conjunctivae normal ENT: Laryngeal tube is in place, No erythema surrounding stoma or drainage. Poor dentition. Unable to fully open mouth due to significant swelling. No signs of infection in the mouth.  Neck is mildly tender to palpation.  Cardiovascular: Normal rate, regular rhythm. No murmurs, rubs, or gallops Pulmonary: Effort normal, breath sounds normal. 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   He presented with facial swelling/numbness which started after tracheostomy tube was placed in his stoma after he was transported to [facility] w/o a device in hi stoma. He was afebrile, HDS, no leukocytosis, leukopenia however with decreased ALC only. Pt was breathing well on exam, but continued to have difficulty fully opening his mouth due to significant swelling. CT soft tissue neck was obtained and was concerning for soft tissue infection without a drainable fluid collection. ENT was consulted, and pt was started on abx in the ED. Per ENT evaluation patient does not appear to be overtly infected. Patient was continued on zyvox and zosyn. Because of his persistent swelling a medrol dosepak was also started.  -Will continue to monitor his respiratory status closely.  -Appreciate ENT recommendations -Zosyn per pharmacy, appreciate their assistance -Continue IV Linezolid 600 mg q12h -Continue steroid dosepak  -Trend CBC   Peripheral neuropathy Hypothyroidism s/p total thryoidectomy  Hypocalcemia The patient presents here with a ~ 2 week history of loss of balance with ambulation as well as numbness and tingling of the right side of his face as well as the distal extremities. 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  Vitamin B12 and folate are within normal limits, negative HIV antibody.  TSH is 98.4, free T4 is < 0.25.  Calcium levels are low, most recently at 7.5.  Suspect his symptoms (peripheral neuropathy, facial numbness, leg cramping) are due to combination of his severe hypothyroidism and hypocalcemia after no hormone replacement or vitamin supplementation after removal of his thyroid and partial  parathryoids.  -Levothyroxine 131 mcg daily -Added robaxin for 3 days -Calcium supplementation daily   Leukopenia Presented here with a WBC of 2.3 (Differential notable for ALC only being decreased). Review of records shows that his WBC were decreased in  2022.  HIV antibody is negative. Could be 2/2 his radiation therapy for cancer tx vs hepatitis infection.  -HCV RNA quant reflex/genotype pending -Peripheral smear is pending -CBC  Normocytic anemia Hemoglobin on admit of 9.8. MCV 98.1. Hemoglobin ~10-11 in August 2022. No overt signs of bleeding. Reticulocyte labs reflect a hypoproliferative state. Differential diagnosis includes anemia of chronic disease in the setting of longstanding infection. -Trend CBC -Peripheral smear is pending  Hepatitis B/C, untreated Has a history of previously untreated hepatitis B/C.  Per review of records from August 2022, a GI/hepatology referral was considered as outpatient, however cannot locate any GI consult records. Labs here reveal a nonreactive hep B surface antigen, reactive hep B surface antibody, indicating immunity likely from his prior infection.  Hepatitis C antibody is reactive, will obtain RNA for further workup.  CMP reveals mildly elevated AST to 48, normal ALT 32. -HCV RNA quant reflex/genotype and pending  Prior to Admission Living Arrangement: Currently incarcerated at [facility] Anticipated Discharge Location: [facility] Barriers to Discharge: Medical stability and work-up Dispo: Anticipated discharge pending IV antibiotic and steroid therapy, medical work-up  Rick Duff, MD 01/16/2022, 1:20 PM After 5pm on weekdays and 1pm on weekends: On Call pager [phone]

## 2022-01-16 NOTE — Progress Notes (Signed)
PT Cancellation Note  Patient Details Name: Gregory Snyder MRN: [id number] DOB: [birth year]   Cancelled Treatment:    Reason Eval/Treat Not Completed: Pt politely declining PT eval at this time, asking to eat breakfast first. Will check back as schedule allows to complete PT evaluation.    Thelma Comp 01/16/2022, 8:46 AM  Rolinda Roan, PT, DPT Acute Rehabilitation Services Secure Chat Preferred Office: [phone]

## 2022-01-17 LAB — CBC WITH DIFFERENTIAL/PLATELET
Abs Immature Granulocytes: 0.14 10*3/uL — ABNORMAL HIGH (ref 0.00–0.07)
Basophils Absolute: 0 10*3/uL (ref 0.0–0.1)
Basophils Relative: 0 %
Eosinophils Absolute: 0 10*3/uL (ref 0.0–0.5)
Eosinophils Relative: 0 %
HCT: 27.4 % — ABNORMAL LOW (ref 39.0–52.0)
Hemoglobin: 8.9 g/dL — ABNORMAL LOW (ref 13.0–17.0)
Immature Granulocytes: 3 %
Lymphocytes Relative: 6 %
Lymphs Abs: 0.3 10*3/uL — ABNORMAL LOW (ref 0.7–4.0)
MCH: 31.7 pg (ref 26.0–34.0)
MCHC: 32.5 g/dL (ref 30.0–36.0)
MCV: 97.5 fL (ref 80.0–100.0)
Monocytes Absolute: 0.3 10*3/uL (ref 0.1–1.0)
Monocytes Relative: 6 %
Neutro Abs: 4.5 10*3/uL (ref 1.7–7.7)
Neutrophils Relative %: 85 %
Platelets: 178 10*3/uL (ref 150–400)
RBC: 2.81 MIL/uL — ABNORMAL LOW (ref 4.22–5.81)
RDW: 14.2 % (ref 11.5–15.5)
WBC: 5.3 10*3/uL (ref 4.0–10.5)
nRBC: 0 % (ref 0.0–0.2)

## 2022-01-17 LAB — BASIC METABOLIC PANEL
Anion gap: 7 (ref 5–15)
BUN: 17 mg/dL (ref 6–20)
CO2: 32 mmol/L (ref 22–32)
Calcium: 8.3 mg/dL — ABNORMAL LOW (ref 8.9–10.3)
Chloride: 101 mmol/L (ref 98–111)
Creatinine, Ser: 1.21 mg/dL (ref 0.61–1.24)
GFR, Estimated: >60 mL/min (ref >60)
Glucose, Bld: 156 mg/dL — ABNORMAL HIGH (ref 70–99)
Potassium: 4.1 mmol/L (ref 3.5–5.1)
Sodium: 140 mmol/L (ref 135–145)

## 2022-01-17 MED ORDER — METHOCARBAMOL 500 MG PO TABS
1500.0000 mg | ORAL_TABLET | Freq: Three times a day (TID) | ORAL | Status: AC
  Administered 2022-01-17 (×2): 1500 mg via ORAL
  Filled 2022-01-17 (×3): qty 3

## 2022-01-17 NOTE — Plan of Care (Signed)
?  Problem: Clinical Measurements: ?Goal: Will remain free from infection ?Outcome: Progressing ?  ?

## 2022-01-17 NOTE — Progress Notes (Signed)
Subjective:  Patient was seen at bedside during rounds this morning.  He states that the swelling in his neck is about the same as yesterday. The numbness in the right side of his face continues to persist as well as the cramping pain in his left leg. He also recently noticed two "knots" in his neck a night or two ago that he says he forgot to mention earlier. They are located on the right side of his neck and are tender to touch. They are located on the same side of the neck as his prior cancer. No other complaints or concerns at this time.   Objective:  Vital signs in last 24 hours: Vitals:   01/16/22 2227 01/17/22 0132 01/17/22 0520 01/17/22 0559  BP: 93/61   (!) 94/58  Pulse: 73 73 85 65  Resp: '18 18 16 18  '$ Temp: 98.8 F (37.1 C)   98 F (36.7 C)  TempSrc: Oral   Oral  SpO2: 96%  95% 98%  Weight:      Height:       General: NAD, nl appearance, lying comfortably in bed, pt is aphonic HE: Normocephalic, atraumatic, EOMI, Conjunctivae normal ENT: Poor dentition. Unable to fully open mouth due to significant swelling. No signs of infection in the mouth. No obvious lymphadenopathy but the right side of the neck is noticeably more swollen than the left. Cardiovascular: Normal rate, regular rhythm. No murmurs, rubs, or gallops Pulmonary: Effort normal, breath sounds normal. No wheezes, rales, or rhonchi Abdominal: Soft, nontender, bowel sounds present Musculoskeletal: no swelling, deformity, injury or tenderness in extremities Skin: Warm, dry, no bruising, erythema, or rash Psychiatric: Normal mood and affect  Assessment/Plan:  Principal Problem:   Cellulitis Active Problems:   Ludwig's angina   Aspiration pneumonitis (HCC)   Edema  ?Ludwig angina This is a patient with a PMHx of supraglottic mass found to be SCC (right supraglottic larynx with involvement of the right tonsillar pillar, epiglottis, true vocal cord, right thyroid cartilage crus with extension to the posterior  neck strap muscles, as well as significant cervical metastatic disease) s/p laryngectomy, R tonsillectomy, total thyroidectomy, R superior parathryoid reimplantation, and tracheoesophageal puncture in 12/2020.   He presented with facial swelling/numbness which started after tracheostomy tube was placed in his stoma after he was transported to [facility] w/o a device in his stoma. He was afebrile, HDS, no leukocytosis, leukopenia however with decreased ALC only. Pt was breathing well on exam, but continued to have difficulty fully opening his mouth due to significant swelling. CT soft tissue neck was obtained and was concerning for soft tissue infection without a drainable fluid collection. ENT was consulted and performed fiberoptic exam which did not show any obvious evidence of recurrent disease. Pt has also been on IV antibiotics and was recently started on a dosepak but continues to complain of swelling. Unclear exactly what else could be contributing to his swelling, so we will reach out to his [facility] onc/surgery teams to help guide our plans moving forward. Will continue steroids and abx for now.  -Will reach out to [facility] providers today to help guide our treatment -Will continue to monitor his respiratory status closely -Appreciate ENT recommendations -Zosyn per pharmacy, appreciate their assistance -Continue IV Linezolid 600 mg q12h -Continue steroid dosepak  -Trend CBC   Peripheral neuropathy Hypothyroidism s/p total thryoidectomy  Hypocalcemia The patient presents here with a ~ 2 week history of loss of balance with ambulation as well as numbness and tingling of  the right side of his face as well as the distal extremities. 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  Vitamin B12 and folate are within normal limits, negative HIV antibody.  TSH is 98.4, free T4 is < 0.25.  Calcium levels are low, most recently at 8.3.  Suspect his symptoms (peripheral neuropathy, facial numbness,  leg cramping) are due to combination of his severe hypothyroidism and hypocalcemia after no hormone replacement or vitamin supplementation after removal of his thyroid and partial parathryoids.  -Levothyroxine 131 mcg daily -Robaxin 1.5 g TID today -Calcium supplementation daily   Leukopenia Presented here with a WBC of 2.3 (Differential notable for ALC only being decreased). Review of records shows that his WBC were decreased in 2022.  HIV antibody is negative. Could be 2/2 his radiation therapy for cancer tx vs hepatitis infection.  -HCV RNA quant reflex/genotype pending -Peripheral smear is pending -CBC  Normocytic anemia Hemoglobin on admit of 9.8. MCV 98.1. Hemoglobin ~10-11 in August 2022. No overt signs of bleeding. Reticulocyte labs reflect a hypoproliferative state. Differential diagnosis includes anemia of chronic disease in the setting of longstanding infection. -Trend CBC -Peripheral smear is pending  Hepatitis B/C, untreated Has a history of previously untreated hepatitis B/C.  Per review of records from August 2022, a GI/hepatology referral was considered as outpatient, however cannot locate any GI consult records. Labs here reveal a nonreactive hep B surface antigen, reactive hep B surface antibody, indicating immunity likely from his prior infection.  Hepatitis C antibody is reactive, will obtain RNA for further workup.  CMP reveals mildly elevated AST to 48, normal ALT 32. -HCV RNA quant reflex/genotype and pending  Prior to Admission Living Arrangement: Currently incarcerated at [facility] Anticipated Discharge Location: [facility] Barriers to Discharge: Medical stability and work-up Dispo: Anticipated discharge pending medical workup  Orvis Brill, MD 01/17/2022, 7:12 AM Pager: [phone]  After 5pm on weekdays and 1pm on weekends: On Call pager [phone]

## 2022-01-18 DIAGNOSIS — L03221 Cellulitis of neck: Secondary | ICD-10-CM

## 2022-01-18 LAB — CBC
HCT: 29.4 % — ABNORMAL LOW (ref 39.0–52.0)
Hemoglobin: 9.2 g/dL — ABNORMAL LOW (ref 13.0–17.0)
MCH: 31.2 pg (ref 26.0–34.0)
MCHC: 31.3 g/dL (ref 30.0–36.0)
MCV: 99.7 fL (ref 80.0–100.0)
Platelets: 199 10*3/uL (ref 150–400)
RBC: 2.95 MIL/uL — ABNORMAL LOW (ref 4.22–5.81)
RDW: 14.6 % (ref 11.5–15.5)
WBC: 5.4 10*3/uL (ref 4.0–10.5)
nRBC: 0.4 % — ABNORMAL HIGH (ref 0.0–0.2)

## 2022-01-18 LAB — HCV RNA QUANT RFLX ULTRA OR GENOTYP
HCV RNA Qnt(log copy/mL): 6.924 log10 IU/mL
HepC Qn: 8400000 IU/mL

## 2022-01-18 LAB — BASIC METABOLIC PANEL
Anion gap: 10 (ref 5–15)
BUN: 18 mg/dL (ref 6–20)
CO2: 31 mmol/L (ref 22–32)
Calcium: 8.2 mg/dL — ABNORMAL LOW (ref 8.9–10.3)
Chloride: 100 mmol/L (ref 98–111)
Creatinine, Ser: 1.2 mg/dL (ref 0.61–1.24)
GFR, Estimated: >60 mL/min (ref >60)
Glucose, Bld: 109 mg/dL — ABNORMAL HIGH (ref 70–99)
Potassium: 4.3 mmol/L (ref 3.5–5.1)
Sodium: 141 mmol/L (ref 135–145)

## 2022-01-18 LAB — HEPATITIS C GENOTYPE

## 2022-01-18 LAB — PATHOLOGIST SMEAR REVIEW

## 2022-01-18 MED ORDER — SODIUM CHLORIDE 0.9 % IV SOLN: 1.0000 g | Freq: Once | INTRAVENOUS | Status: DC

## 2022-01-18 MED ORDER — CALCIUM GLUCONATE-NACL 2-0.675 GM/100ML-% IV SOLN
2.0000 g | Freq: Once | INTRAVENOUS | Status: AC
  Administered 2022-01-18: 2000 mg via INTRAVENOUS
  Filled 2022-01-18: qty 100

## 2022-01-18 NOTE — Progress Notes (Signed)
RT called to patient's bed due to patient's laryngectomy tube being out.  RT at bedside, patient in no distress. RN assisted with cleaning the laryngectomy tube, RT cleaned patient's neck and around the stoma site removing dried mucus. Patient placed laryngectomy tube back in place with a new HME. RT assisted with securing the tube.  RT will continue to monitor.

## 2022-01-18 NOTE — Progress Notes (Signed)
Subjective:  Patient was seen at bedside during rounds this morning.    He reports feeling largely unchanged. He states that the medications have improved his pain but feels that numbness and tingling are about the same. States that the "knots" in his neck hurt more and that the swelling in his neck is about the same. Reports calcium and synthroid supplementation have not helped much. No other complaints or concerns at this time.  Objective:  Vital signs in last 24 hours: Vitals:   01/17/22 2027 01/17/22 2038 01/18/22 0258 01/18/22 0501  BP: 109/68   105/67  Pulse: 79 79 75 65  Resp: '18 20 18 16  '$ Temp: 98.4 F (36.9 C)   (!) 97.5 F (36.4 C)  TempSrc: Oral   Oral  SpO2: 97% 95% 97% 100%  Weight:      Height:       General: NAD, nl appearance, lying comfortably in bed, pt is aphonic HE: Normocephalic, atraumatic, EOMI, Conjunctivae normal ENT: Poor dentition. Unable to fully open mouth due to significant swelling. No signs of infection in the mouth. No obvious lymphadenopathy. Neck is tender to touch. The right side of the neck is mildly swollen compared to the left side Cardiovascular: Normal rate, regular rhythm. No murmurs, rubs, or gallops Pulmonary: Effort normal, breath sounds normal. 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   He presented with facial swelling/numbness which started after tracheostomy tube was placed in his stoma after he was transported to [facility] w/o a device in his stoma. He was afebrile, HDS, no leukocytosis, leukopenia however with decreased ALC only. Pt was breathing well on exam, but continued to have difficulty fully opening his mouth due to significant swelling. CT soft tissue neck was obtained and was concerning for soft tissue infection without a drainable fluid collection. ENT was consulted and performed fiberoptic exam which did not show any obvious evidence of recurrent disease. Pt has also been on IV antibiotics and was recently started on a dosepak but continues to complain of swelling. Unclear exactly what else could be contributing to his swelling, so we will reach out to his [facility] onc/surgery teams to help guide our plans moving forward. Will continue steroids but discontinue his antibiotics today.  -Will reach out to [facility] providers today to help guide our treatment -Will continue to monitor his respiratory status closely -Appreciate ENT recommendations -DC antibiotics today -Continue steroid dosepak  -Trend CBC  Peripheral neuropathy Hypothyroidism s/p total thryoidectomy  Hypocalcemia The patient presents here with a ~ 2 week history of loss of balance with ambulation as well as numbness and tingling of the right side of his face as well as the distal extremities. 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 Vitamin B12 and folate are within normal limits, negative HIV antibody. TSH  is 98.4, free T4 is < 0.25. Calcium levels are low, most recently at 8.3. Suspect his symptoms (peripheral neuropathy, facial numbness, leg cramping) are due to combination of his severe hypothyroidism and hypocalcemia after no hormone replacement or vitamin  supplementation after removal of his thyroid and partial parathryoids.  -Levothyroxine 131 mcg daily -Calcium supplementation daily  Leukopenia Presented here with a WBC of 2.3 (Differential notable for ALC only being decreased). Review of records shows that his WBC were decreased in 2022. HIV antibody is negative. Peripheral smear shows mild leukopenia with left shift but no blasts or abnormal morphology. Could be 2/2 his radiation therapy for cancer tx vs hepatitis infection.  -HCV RNA quant reflex/genotype pending -CBC  Normocytic anemia Hemoglobin on admit of 9.8. MCV 98.1. Hemoglobin ~10-11 in August 2022. No overt signs of bleeding. Reticulocyte labs reflect a hypoproliferative state. Peripheral smear shows mild leukopenia with left shift and normochromic normocytic anemia but no blasts or abnormal morphology. Differential diagnosis includes anemia of chronic disease in the setting of longstanding infection.  -Trend CBC  Hepatitis B/C, untreated Has a history of previously untreated hepatitis B/C.  Per review of records from August 2022, a GI/hepatology referral was considered as outpatient, however cannot locate any GI consult records. Labs here reveal a nonreactive hep B surface antigen, reactive hep B surface antibody, indicating immunity likely from his prior infection.  Hepatitis C antibody is reactive, will obtain RNA for further workup. CMP reveals mildly elevated AST to 48, normal ALT 32.  -HCV RNA quant reflex/genotype and pending  Prior to Admission Living Arrangement: Currently incarcerated at [facility] Anticipated Discharge Location: [facility] Barriers to Discharge: Medical stability and work-up Dispo: Anticipated discharge pending medical workup  Orvis Brill, MD 01/18/2022, 6:41 AM Pager: [phone]  After 5pm on weekdays and 1pm on weekends: On Call pager [phone]

## 2022-01-19 DIAGNOSIS — L03221 Cellulitis of neck: Secondary | ICD-10-CM | POA: Diagnosis not present

## 2022-01-19 LAB — CBC
HCT: 27.6 % — ABNORMAL LOW (ref 39.0–52.0)
Hemoglobin: 8.9 g/dL — ABNORMAL LOW (ref 13.0–17.0)
MCH: 31.8 pg (ref 26.0–34.0)
MCHC: 32.2 g/dL (ref 30.0–36.0)
MCV: 98.6 fL (ref 80.0–100.0)
Platelets: 195 10*3/uL (ref 150–400)
RBC: 2.8 MIL/uL — ABNORMAL LOW (ref 4.22–5.81)
RDW: 14.6 % (ref 11.5–15.5)
WBC: 4.9 10*3/uL (ref 4.0–10.5)
nRBC: 0.4 % — ABNORMAL HIGH (ref 0.0–0.2)

## 2022-01-19 LAB — BASIC METABOLIC PANEL
Anion gap: 8 (ref 5–15)
BUN: 21 mg/dL — ABNORMAL HIGH (ref 6–20)
CO2: 31 mmol/L (ref 22–32)
Calcium: 8.3 mg/dL — ABNORMAL LOW (ref 8.9–10.3)
Chloride: 102 mmol/L (ref 98–111)
Creatinine, Ser: 1.09 mg/dL (ref 0.61–1.24)
GFR, Estimated: >60 mL/min (ref >60)
Glucose, Bld: 104 mg/dL — ABNORMAL HIGH (ref 70–99)
Potassium: 4.2 mmol/L (ref 3.5–5.1)
Sodium: 141 mmol/L (ref 135–145)

## 2022-01-19 MED ORDER — SENNOSIDES-DOCUSATE SODIUM 8.6-50 MG PO TABS
1.0000 | ORAL_TABLET | Freq: Every day | ORAL | Status: DC
  Administered 2022-01-19 – 2022-01-22 (×4): 1 via ORAL
  Filled 2022-01-19 (×4): qty 1

## 2022-01-19 MED ORDER — THIAMINE HCL 100 MG PO TABS
100.0000 mg | ORAL_TABLET | Freq: Every day | ORAL | Status: DC
  Administered 2022-01-20 – 2022-01-22 (×3): 100 mg via ORAL
  Filled 2022-01-19 (×3): qty 1

## 2022-01-19 MED ORDER — FOLIC ACID 1 MG PO TABS
1.0000 mg | ORAL_TABLET | Freq: Every day | ORAL | Status: DC
Start: 2022-01-20 — End: 2022-01-22
  Administered 2022-01-20 – 2022-01-22 (×3): 1 mg via ORAL
  Filled 2022-01-19 (×3): qty 1

## 2022-01-19 NOTE — Progress Notes (Signed)
Physical Therapy Treatment Patient Details Name: Gregory Snyder MRN: [id number] DOB: Jun 19, 1976 Today's Date: 01/19/2022   History of Present Illness Pt is a 46 y/o male who presents with tongue and throat swelling, LE swelling with a 33 pound weight gain in the last 15 days PTA. 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    PT Comments    Pt received in supine, agreeable to therapy session with emphasis on gait and stairs training as well as close vitals assessment. Pt performed bed mobility and transfers modI with increased time. He performed stair ascent/descent with BUE on R wall rail with up to min guard assist and gait training household distance in hallway with RW. Pt c/o moderate dyspnea (2/4 DOE) with exertion, improves with seated break. Pt communicating via tablet with pen. Pt continues to benefit from PT services to progress toward functional mobility goals.   Recommendations for follow up therapy are one component of a multi-disciplinary discharge planning process, led by the attending physician.  Recommendations may be updated based on patient status, additional functional criteria and insurance authorization.  Follow Up Recommendations  Outpatient PT     Assistance Recommended at Discharge Intermittent Supervision/Assistance  Patient can return home with the following A little help with walking and/or transfers;A little help with bathing/dressing/bathroom;Assistance with cooking/housework;Assist for transportation;Help with stairs or ramp for entrance   Equipment Recommendations  Rolling walker (2 wheels)    Recommendations for Other Services       Precautions / Restrictions Precautions Precautions: Fall Precaution Comments: Trach; OK to sit in chair if close enough to be for shackles to be placed. Restrictions Weight Bearing Restrictions: No      Mobility  Bed Mobility Overal bed mobility: Modified Independent Bed Mobility: Supine to Sit     Supine to sit: Modified independent (Device/Increase time)     General bed mobility comments: No assist required for transition to/from EOB. Increased time but able to complete well.    Transfers Overall transfer level: Modified independent Equipment used: Rolling walker (2 wheels) Transfers: Sit to/from Stand, Bed to chair/wheelchair/BSC    General transfer comment: No assist required. Pt with quick power up to full stand and without unsteadiness or LOB.    Ambulation/Gait Ambulation/Gait assistance: Supervision Gait Distance (Feet): 155 Feet Assistive device: Rolling walker (2 wheels) Gait Pattern/deviations: Step-through pattern, Decreased stride length, Trunk flexed Gait velocity: Decreased     General Gait Details: Slow and guarded, however may be due to shackles as well. Pt denies dizziness with head turns or up/down gazes, pt also turned 180 deg but needed >3 seconds to perform with RW. SpO2 94-98% on RA and HR 76 bpm resting and up to 92 bpm with exertion.   Stairs Stairs: Yes Stairs assistance: Min guard Stair Management: One rail Right, Forwards, Step to pattern Number of Stairs: 5 General stair comments: cues for safe step sequencing, up with stronger R leg and down with weaker (or more painful?) L leg, pt at first ignoring cues and stepping down with RLE, after more encouragement he steps down with LLE and reports this was easier. x5 reps for BLE strengthening and safety instruction       Balance Overall balance assessment: Needs assistance Sitting-balance support: Feet supported, No upper extremity supported Sitting balance-Leahy Scale: Good     Standing balance support: During functional activity, Bilateral upper extremity supported, Reliant on assistive device for balance Standing  balance-Leahy Scale: Poor Standing balance comment: Reliant on BUE support  of RW for dynamic tasks, able to static stand with U UE support                            Cognition Arousal/Alertness: Awake/alert Behavior During Therapy: WFL for tasks assessed/performed Overall Cognitive Status: Within Functional Limits for tasks assessed              General Comments: pleasantly cooperative; pt pleased after he was allowed to sit up in chair, he reports this was the first time since getting to the hospital.        Exercises      General Comments  BP 109/73 (85) pre and stable assessed seated post-session. SpO2 WFL on RA and HR 70's resting  and up to 90's bpm with exertion.      Pertinent Vitals/Pain Pain Assessment Pain Assessment: Faces Faces Pain Scale: Hurts a little bit Pain Location: generalized, pt did not localize Pain Descriptors / Indicators: Sore, Grimacing Pain Intervention(s): Monitored during session, Repositioned     PT Goals (current goals can now be found in the care plan section) Acute Rehab PT Goals Patient Stated Goal: Breathing improved, to sit up more. PT Goal Formulation: With patient Time For Goal Achievement: 01/30/22 Progress towards PT goals: Progressing toward goals    Frequency    Min 3X/week      PT Plan Current plan remains appropriate    AM-PAC PT "6 Clicks" Mobility   Outcome Measure  Help needed turning from your back to your side while in a flat bed without using bedrails?: None Help needed moving from lying on your back to sitting on the side of a flat bed without using bedrails?: None Help needed moving to and from a bed to a chair (including a wheelchair)?: A Little Help needed standing up from a chair using your arms (e.g., wheelchair or bedside chair)?: None Help needed to walk in hospital room?: A Little Help needed climbing 3-5 steps with a railing? : A Lot (mod cues for sequencing) 6 Click Score: 20    End of Session Equipment Utilized During Treatment: Gait belt Activity  Tolerance: Patient tolerated treatment well Patient left: with call bell/phone within reach;in chair;with nursing/sitter in room;Other (comment) Audiological scientist in room with him) Nurse Communication: Mobility status PT Visit Diagnosis: Unsteadiness on feet (R26.81);Difficulty in walking, not elsewhere classified (R26.2)     Time: 8850-2774 PT Time Calculation (min) (ACUTE ONLY): 28 min  Charges:  $Gait Training: 8-22 mins $Therapeutic Activity: 8-22 mins                     Desi Carby P., PTA Acute Rehabilitation Services Secure Chat Preferred 9a-5:30pm Office: [place] 01/19/2022, 2:53 PM

## 2022-01-19 NOTE — Progress Notes (Signed)
Speech Language Pathology Treatment: Cognitive-Linquistic  Patient Details Name: Gregory Snyder MRN: [id number] DOB: Jun 13, 1976 Today's Date: 01/19/2022 Time: 8546-2703 SLP Time Calculation (min) (ACUTE ONLY): 41 min  Assessment / Plan / Recommendation Clinical Impression  Pt was seen for ongoing follow up pertaining to his established laryngectomy and stoma care. SLP continues to work on coordinating care and supplies post-discharge. Note that [facility] does not have him as an established client, so asked pt about where he was getting supplies from previously and he is not sure. Will have to work on completing prescription form to send to [place] on his behalf. He is in agreement with SLP reaching out to [facility] and [facility] speech therapy PRN. Pt continues to have difficulty using electrolarynx at the moment but is writing effectively on his tablet. Pt removed his lary tube independently but did ask SLP for assistance in cleaning it. This was cleaned and then return to the patient for him to replace it himself. He says that he still needs to work on cleaning his prosthesis more thoroughly. SLP provided additional pipe cleaners from trach care kits, awaiting delivery of additional supplies which should arrive from [place] very soon. Will continue to follow and assist as able.   HPI HPI: Pt is a 46 yo male presenting with pain and swelling in his floor of mouth and neck. CT Neck concerning for soft tissue infection and also showed extensive post-surgical changes (from May 2022), including partial pharyngectomy, total laryngectomy, thyroidectomy, base of tongue resection, and nodal dissections. Most recent SLP notes at [facility] are from August 2022, at which time pt had a larytube (10/55) and was using "home" HMEs. He was starting to use TEP (17 [place], 10 mm Provox Vega +1 shim). On 12/28/21, pt lost his larytube and upon arrival to the ER, was given an #8 [place].      SLP Plan  Continue with current plan of care       Recommendations for follow up therapy are one component of a multi-disciplinary discharge planning process, led by the attending physician.  Recommendations may be updated based on patient status, additional functional criteria and insurance authorization.    Recommendations  Diet recommendations: Dysphagia 3 (mechanical soft);Thin liquid Liquids provided via: Cup;Straw Medication Administration: Whole meds with liquid (could crush pills if pt thinks they are hard to get down) Supervision: Patient able to self feed Compensations: Small sips/bites;Follow solids with liquid Postural Changes and/or Swallow Maneuvers: Seated upright 90 degrees                Oral Care Recommendations: Oral care BID Follow Up Recommendations: Outpatient SLP Assistance recommended at discharge: PRN SLP Visit Diagnosis: Aphonia (R49.1) Plan: Continue with current plan of care           Osie Bond., M.A. [place] Office [phone]  Secure chat preferred   01/19/2022, 3:04 PM

## 2022-01-19 NOTE — Progress Notes (Signed)
Laryngectomy supplies arrived from [place]. I helped Mr. Stephanie with cleaning his voice prosthesis - which he can do independently with good light and a mirror. He also used the provox flush to clean his voice prosthesis and we replaced his current larytube to a fenestrated one to facilitate speech.  He now has 3 larytubes - 1 fenestrated (in his stoma) and 2 regular (all 9/55), approximately 110 HMEs (to be changed daily or when saturated with mucus), 6 voice prosthesis brushes, 1 voice prosthesis flush, 6 larytube cleaning brushes, 2 shower guards, 1 LCDE writing tablet, and 6 larytube neck strap holders (1 in use). He is wearing an emergency "neck breather" bracelet.  He is very capable and independent with all aspects of his stoma, and laryngectomy equipment.

## 2022-01-19 NOTE — TOC Progression Note (Signed)
Transition of Care [facility]) - Initial/Assessment Note    Patient Details  Name: Gregory Snyder MRN: [id number] Date of Birth: 1976-01-21  Transition of [facility]) CM/SW Contact:    Milinda Antis, [place] Phone Number: 01/19/2022, 1:14 PM  Clinical Narrative:                 Patient admitted from [facility].  When patient is discharged please call [phone] press "8"  and ask for a nurse to give report to.   Transition of Care Department [facility]) will continue to monitor patient advancement through interdisciplinary progression rounds. If new patient transition needs arise, please place a TOC consult.     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: Artist                     Activities of Daily Living Home Assistive Devices/Equipment: Vent/Trach supplies ADL Screening (condition at time of admission) Patient's cognitive ability adequate to safely complete daily activities?: Yes Is the patient deaf or have difficulty hearing?: No Does the patient have difficulty seeing, even when wearing glasses/contacts?: No Does the patient have difficulty concentrating, remembering, or making decisions?: No Patient able to express need for assistance with ADLs?: Yes Does the patient have difficulty dressing or bathing?: No Independently performs ADLs?: Yes (appropriate for developmental age) Does the patient have difficulty walking or climbing stairs?: Yes Weakness of Legs: Both Weakness of Arms/Hands: None  Permission Sought/Granted                  Emotional Assessment Appearance:: Appears  stated age     Orientation: : Oriented to Self, Oriented to Place, Oriented to  Time Alcohol / Substance Use: Not Applicable Psych Involvement: No (comment)  Admission diagnosis:  Aspiration pneumonitis ([place]) [J69.0] Ludwig's angina [K12.2] Cellulitis [L03.90] Tongue swelling [R22.0] Throat cancer ([place]) [C14.0] Tracheostomy tube present ([place]) [Z93.0] Leukopenia, unspecified type [D72.819] Edema, unspecified type [R60.9] Patient Active Problem List   Diagnosis Date Noted   Aspiration pneumonitis ([place])    Edema    Cellulitis 01/13/2022   Ludwig's angina    Throat cancer ([place])    Tongue swelling    PCP:  System, Provider Not In Pharmacy:  No Pharmacies Listed    Social Determinants of Health (SDOH) Interventions    Readmission Risk Interventions     View : No data to display.

## 2022-01-19 NOTE — Progress Notes (Signed)
Patient is in chair at this time. Notified nurse patient has to bed in bed. Fran Lowes, RN VAST

## 2022-01-19 NOTE — Progress Notes (Signed)
Mobility Specialist Progress Note   01/19/22 1813  Mobility  Activity Refused mobility   Pt requesting to rest for the evening. MS will f/u tomorrow if time permits.   Holland Falling Mobility Specialist Phone Number [phone]

## 2022-01-19 NOTE — Progress Notes (Signed)
2nd time to see patient for assessment and placement of PIV. Physical therapy walking patient in the hall. Notified nurse to reconsult when patient ready for PIV placement. Fran Lowes, RN VAST

## 2022-01-19 NOTE — Progress Notes (Signed)
Physical Therapy Treatment Patient Details Name: Gregory Snyder MRN: [id number] DOB: 1975-12-05 Today's Date: 01/19/2022   History of Present Illness Pt is a 46 y/o male who presents with tongue and throat swelling, LE swelling with a 33 pound weight gain in the last 15 days PTA. 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    PT Comments    Pt received in chair, PTA called back to pt room to assist with transfer back to bed for IV line placement, along with emphasis on standing balance while awaiting return of IV team member. Pt performed standing balance and reciprocal transfers with cues for safety and up to min guard for stability with performing narrow BOS stances unsupported. Pt reports increased fatigue and moderate LE pain at end of session, able to return to supine modI. Pt continues to benefit from PT services to progress toward functional mobility goals.    Recommendations for follow up therapy are one component of a multi-disciplinary discharge planning process, led by the attending physician.  Recommendations may be updated based on patient status, additional functional criteria and insurance authorization.  Follow Up Recommendations  Outpatient PT     Assistance Recommended at Discharge Intermittent Supervision/Assistance  Patient can return home with the following A little help with walking and/or transfers;A little help with bathing/dressing/bathroom;Assistance with cooking/housework;Assist for transportation;Help with stairs or ramp for entrance   Equipment Recommendations  Rolling walker (2 wheels)    Recommendations for Other Services       Precautions / Restrictions Precautions Precautions: Fall Precaution Comments: Trach; OK to sit in chair if close enough to be for shackles to be placed. Restrictions Weight Bearing Restrictions: No     Mobility  Bed  Mobility Overal bed mobility: Modified Independent Bed Mobility: Supine to Sit     Supine to sit: Modified independent (Device/Increase time)     General bed mobility comments: No assist required for transition to/from EOB. Increased time but able to complete well.    Transfers Overall transfer level: Modified independent Equipment used: Rolling walker (2 wheels) Transfers: Sit to/from Stand, Bed to chair/wheelchair/BSC             General transfer comment: No assist required, increased time to stand from recliner    Ambulation/Gait Ambulation/Gait assistance: Supervision Gait Distance (Feet): 5 Feet Assistive device: Rolling walker (2 wheels) Gait Pattern/deviations: Step-through pattern, Decreased stride length, Trunk flexed Gait velocity: Decreased     General Gait Details: chair>bed, distance limited due to IV team arriving to place PIV and needing pt back in supine to perform.   Stairs Stairs: Yes Stairs assistance: Min guard Stair Management: One rail Right, Forwards, Step to pattern Number of Stairs: 5 General stair comments: cues for safe step sequencing, up with stronger R leg and down with weaker (or more painful?) L leg, pt at first ignoring cues and stepping down with RLE, after more encouragement he steps down with LLE and reports this was easier. x5 reps for BLE strengthening and safety instruction   Wheelchair Mobility    Modified Rankin (Stroke Patients Only)       Balance Overall balance assessment: Needs assistance Sitting-balance support: Feet supported, No upper extremity supported Sitting balance-Leahy Scale: Good     Standing balance support: During functional activity, Bilateral upper extremity supported, Reliant on assistive device for balance Standing balance-Leahy Scale: Poor Standing balance comment: see balance assessment below; reliant on BUE  support of RW or staff member for dynamic standing tasks     Tandem Stance - Right  Leg: 5 (LOB when unsupported within 1-5 seconds; can stand longer with BUE support but very unsteady) Tandem Stance - Left Leg: 0     High level balance activites: Direction changes, Turns, Head turns, Other (comment) (no LOB using RW for head turns) High Level Balance Comments: feet together, semi-tandem stances x60 seconds each LE advanced; see full tandem above (unable to perform without BUE support)            Cognition Arousal/Alertness: Awake/alert Behavior During Therapy: WFL for tasks assessed/performed Overall Cognitive Status: Within Functional Limits for tasks assessed                                 General Comments: pleasantly cooperative        Exercises Other Exercises Other Exercises: STS x 5 reps x2 sets (10 total) from EOB with BUE crossed Other Exercises: standing feet together, semi-tandem, full tandem stances with each LE advanced (see above) Other Exercises: pt instructed on seated/supine LE exercises to continue on his own.    General Comments General comments (skin integrity, edema, etc.): HR to 90's bpm with standing balance tasks      Pertinent Vitals/Pain Pain Assessment Pain Assessment: Faces Faces Pain Scale: Hurts little more Pain Location: B thigh/LE cramping after standing balance assessment Pain Descriptors / Indicators: Grimacing, Cramping Pain Intervention(s): Monitored during session, Repositioned (encouraged pt to hydrate; heels floated)    Home Living                          Prior Function            PT Goals (current goals can now be found in the care plan section) Acute Rehab PT Goals Patient Stated Goal: Breathing improved, to sit up more. PT Goal Formulation: With patient Time For Goal Achievement: 01/30/22 Progress towards PT goals: Progressing toward goals    Frequency    Min 3X/week      PT Plan Current plan remains appropriate    Co-evaluation              AM-PAC PT "6  Clicks" Mobility   Outcome Measure  Help needed turning from your back to your side while in a flat bed without using bedrails?: None Help needed moving from lying on your back to sitting on the side of a flat bed without using bedrails?: None Help needed moving to and from a bed to a chair (including a wheelchair)?: A Little Help needed standing up from a chair using your arms (e.g., wheelchair or bedside chair)?: None Help needed to walk in hospital room?: A Little Help needed climbing 3-5 steps with a railing? : A Lot (mod cues for sequencing) 6 Click Score: 20    End of Session Equipment Utilized During Treatment: Gait belt Activity Tolerance: Patient tolerated treatment well Patient left: with call bell/phone within reach;in chair;with nursing/sitter in room;Other (comment) Audiological scientist in room with him) Nurse Communication: Mobility status PT Visit Diagnosis: Unsteadiness on feet (R26.81);Difficulty in walking, not elsewhere classified (R26.2)     Time: 9379-0240 PT Time Calculation (min) (ACUTE ONLY): 12 min  Charges:  $Therapeutic Exercise: 8-22 mins                    Dana Debo P., PTA Acute  Rehabilitation Services Secure Chat Preferred 9a-5:30pm Office: [place] 01/19/2022, 4:35 PM

## 2022-01-19 NOTE — Progress Notes (Addendum)
Subjective:  Patient was seen at bedside during rounds this morning.    Overall, the patient feels about the same. Patient endorses persistent neck pain and new onset dysphagia with some regurgitation of both solids and fluids. The dysphagia started yesterday with dinner. Still has pain in neck and limited ability to open his mouth. No other complaints or concerns at this time.  Objective:  Vital signs in last 24 hours: Vitals:   01/19/22 0001 01/19/22 0015 01/19/22 0302 01/19/22 0419  BP: 108/69   103/71  Pulse: 73 74 72 67  Resp: '16 18 16 17  '$ Temp: 98.7 F (37.1 C)   98 F (36.7 C)  TempSrc: Oral   Oral  SpO2: 94% 97% 94% 100%  Weight:      Height:       General: NAD, nl appearance, lying comfortably in bed, pt is aphonic HE: Normocephalic, atraumatic, EOMI, Conjunctivae normal ENT: Poor dentition. Unable to fully open mouth due to significant swelling. No signs of infection in the mouth. Neck is tender to touch. The right side of the neck is mildly swollen compared to the left side Cardiovascular: Normal rate, regular rhythm. No murmurs, rubs, or gallops Pulmonary: Effort normal, breath sounds normal. 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  He presented with facial swelling/numbness which started after tracheostomy tube was placed in his stoma after he was transported to [facility] w/o a device in his stoma. He was afebrile, HDS, no leukocytosis, leukopenia however with decreased ALC only. Pt was breathing well on exam, but continues to have difficulty fully opening his mouth due to significant swelling. CT soft tissue neck was obtained and was concerning for soft tissue infection without a drainable fluid collection. ENT was consulted and performed fiberoptic exam which did not show any obvious evidence of recurrent disease. S/p antibiotics but pt still complained of swelling, so recently started a steroid dosepak. Unclear exactly what else could be contributing to his swelling, so we will reach out to his [facility] onc/surgery teams again to help guide our plans moving forward. Of note, pt complained of new onset dysphagia yesterday. Will also have SLP come and evaluate the patient today.  -Will reach out to [facility] providers again to help guide our treatment -Will continue to monitor his respiratory status closely -Appreciate ENT recommendations -Appreciate SLP eval and recs -Continue steroid dosepak  -Trend CBC  Peripheral neuropathy Hypothyroidism s/p total thryoidectomy  Hypocalcemia The patient presents here with a ~ 2 week history of loss of balance with ambulation as well as numbness and tingling of the right side of his face as well as the distal extremities. 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 Vitamin B12 and folate are within normal limits, negative HIV antibody. TSH is 98.4, free T4 is < 0.25. Calcium levels  are low, most recently at 8.3. Suspect his symptoms (peripheral neuropathy, facial numbness, leg cramping) are due to combination of his severe hypothyroidism and hypocalcemia after no hormone replacement or vitamin supplementation after removal of his thyroid  and partial parathryoids.  -Levothyroxine 131 mcg daily -Calcium supplementation daily  Leukopenia, resolved though inappropriately normal Presented here with a WBC of 2.3 (Differential notable for ALC only being decreased). Review of records shows that his WBC were decreased in 2022. HIV antibody is negative. Peripheral smear shows mild leukopenia with left shift but no blasts or abnormal morphology. Could be 2/2 his radiation therapy for cancer tx vs hepatitis infection. Resolved this admission however he was started on steroids for his neck swelling this admission. Most recent WBC of 4.9. -CTM  Normocytic anemia Hemoglobin on admit of 9.8. MCV 98.1. Hemoglobin ~10-11 in August 2022. No overt signs of bleeding. Reticulocyte labs reflect a hypoproliferative state. Peripheral smear shows mild leukopenia with left shift and normochromic normocytic anemia but no blasts or abnormal morphology. Differential diagnosis includes anemia of chronic disease in the setting of longstanding infection.  -Trend CBC  Hepatitis B/C, untreated Has a history of previously untreated hepatitis B/C.  Per review of records from August 2022, a GI/hepatology referral was considered as outpatient, however cannot locate any GI consult records. Labs here reveal a nonreactive hep B surface antigen, reactive hep B surface antibody, indicating immunity likely from his prior infection. Hepatitis C antibody is reactive. CMP reveals mildly elevated AST to 48, normal ALT 32. HCV RNA was 8.4 million copies. Will need to coordinate management of his HCV with jail moving forward. -HCV Fibrosure pending  Prior to Admission Living Arrangement: Currently incarcerated at [facility] Anticipated Discharge Location: [facility] Barriers to Discharge: Medical stability and work-up Dispo: Anticipated discharge pending medical workup  Orvis Brill, MD 01/19/2022, 6:42 AM Pager: [phone]  After 5pm on weekdays  and 1pm on weekends: On Call pager [phone]

## 2022-01-19 NOTE — Evaluation (Signed)
Clinical/Bedside Swallow Evaluation Patient Details  Name: Gregory Snyder MRN: [id number] Date of Birth: 1975/09/02  Today's Date: 01/19/2022 Time: SLP Start Time (ACUTE ONLY): 1240 SLP Stop Time (ACUTE ONLY): 1321 SLP Time Calculation (min) (ACUTE ONLY): 41 min  Past Medical History: History reviewed. No pertinent past medical history. Past Surgical History:  Past Surgical History:  Procedure Laterality Date   TOTAL LARYNGECTOMY N/A 12/2020   HPI:  Pt is a 46 yo male presenting with pain and swelling in his floor of mouth and neck. CT Neck concerning for soft tissue infection and also showed extensive post-surgical changes (from May 2022), including partial pharyngectomy, total laryngectomy, thyroidectomy, base of tongue resection, and nodal dissections. Most recent SLP notes at [facility] are from August 2022, at which time pt had a larytube (10/55) and was using "home" HMEs. He was starting to use TEP (17 [place], 10 mm Provox Vega +1 shim). On 12/28/21, pt lost his larytube and upon arrival to the ER, was given an #8 Shiley.    Assessment / Plan / Recommendation  Clinical Impression  SLP was ordered for swallowing due to pt report of acute onset swallowing difficulties that started on previous date. Pt describes this as having a hard time getting things to go down, primarily noting this with harder foods. Suspect that this may be related to current edema/numbness as he denies any esophageal hx or h/o similar symptoms. Pt also has quite limited ROM of his lips, tongue, and mandible on his R side, so suspect mastication might not be as effective as it normally would be. Pt did consume some bites of tuna salad, which he consumed with some extra swallows but reporting that he could get it down. SLP had pt remove his lary tube to directly observe his prosthesis with no overt leaking through or around it. Given pt's previous total laryngectomy, his airway is completely separated from his esophagus except for  access through the prosthesis, so aspiration would only be possible through the prosthesis if it were to start leaking. Therefore, risk at this time is very low. Encouraged pt to try foods that are soft and moist, using liquids during meals as well. Pt is interested in trying a mechanical soft diet to facilitate this, so will make that change and follow at least briefly. SLP Visit Diagnosis: Dysphagia, unspecified (R13.10)    Aspiration Risk  Mild aspiration risk (only possible if prosthesis were to leak or if something were to fall directly into his stoma)    Diet Recommendation Dysphagia 3 (Mech soft);Thin liquid   Liquid Administration via: Cup;Straw Medication Administration: Whole meds with liquid (could crush pills if pt thinks they are hard to get down) Supervision: Patient able to self feed Compensations: Small sips/bites;Follow solids with liquid Postural Changes: Seated upright at 90 degrees    Other  Recommendations Oral Care Recommendations: Oral care BID    Recommendations for follow up therapy are one component of a multi-disciplinary discharge planning process, led by the attending physician.  Recommendations may be updated based on patient status, additional functional criteria and insurance authorization.  Follow up Recommendations Outpatient SLP      Assistance Recommended at Discharge PRN  Functional Status Assessment Patient has had a recent decline in their functional status and demonstrates the ability to make significant improvements in function in a reasonable and predictable amount of time.  Frequency and Duration min 2x/week  2 weeks       Prognosis Prognosis for Safe Diet Advancement: Good  Swallow Study   General HPI: Pt is a 46 yo male presenting with pain and swelling in his floor of mouth and neck. CT Neck concerning for soft tissue infection and also showed extensive post-surgical changes (from May 2022), including partial pharyngectomy, total  laryngectomy, thyroidectomy, base of tongue resection, and nodal dissections. Most recent SLP notes at [facility] are from August 2022, at which time pt had a larytube (10/55) and was using "home" HMEs. He was starting to use TEP (17 [place], 10 mm Provox Vega +1 shim). On 12/28/21, pt lost his larytube and upon arrival to the ER, was given an #8 Shiley. Type of Study: Bedside Swallow Evaluation Previous Swallow Assessment: none in chart Diet Prior to this Study: Regular;Thin liquids Temperature Spikes Noted: No Respiratory Status: Room air History of Recent Intubation: No Behavior/Cognition: Alert;Cooperative;Pleasant mood Oral Cavity Assessment: Other (comment) (edema) Oral Care Completed by SLP: No Oral Cavity - Dentition: Adequate natural dentition;Missing dentition Vision: Functional for self-feeding Self-Feeding Abilities: Able to feed self Patient Positioning: Upright in bed Baseline Vocal Quality: Aphonic (s/p total laryngectomy)    Oral/Motor/Sensory Function     Ice Chips Ice chips: Not tested   Thin Liquid Thin Liquid: Within functional limits Presentation: Self Fed;Straw    Nectar Thick Nectar Thick Liquid: Not tested   Honey Thick Honey Thick Liquid: Not tested   Puree Puree: Not tested   Solid     Solid: Impaired Presentation: Self Fed Pharyngeal Phase Impairments: Multiple swallows      Osie Bond., M.A. [place] Office [phone]  Secure chat preferred  01/19/2022,3:01 PM

## 2022-01-20 DIAGNOSIS — L03221 Cellulitis of neck: Secondary | ICD-10-CM | POA: Diagnosis not present

## 2022-01-20 LAB — BASIC METABOLIC PANEL
Anion gap: 11 (ref 5–15)
BUN: 21 mg/dL — ABNORMAL HIGH (ref 6–20)
CO2: 29 mmol/L (ref 22–32)
Calcium: 8.4 mg/dL — ABNORMAL LOW (ref 8.9–10.3)
Chloride: 100 mmol/L (ref 98–111)
Creatinine, Ser: 0.96 mg/dL (ref 0.61–1.24)
GFR, Estimated: >60 mL/min (ref >60)
Glucose, Bld: 101 mg/dL — ABNORMAL HIGH (ref 70–99)
Potassium: 4.2 mmol/L (ref 3.5–5.1)
Sodium: 140 mmol/L (ref 135–145)

## 2022-01-20 MED ORDER — CYCLOBENZAPRINE HCL 5 MG PO TABS
5.0000 mg | ORAL_TABLET | Freq: Three times a day (TID) | ORAL | Status: AC
  Administered 2022-01-20 – 2022-01-21 (×3): 5 mg via ORAL
  Filled 2022-01-20 (×3): qty 1

## 2022-01-20 MED ORDER — METHOCARBAMOL 500 MG PO TABS: 1500.0000 mg | ORAL_TABLET | Freq: Three times a day (TID) | ORAL | Status: DC

## 2022-01-20 NOTE — Progress Notes (Signed)
Physical Therapy Treatment Patient Details Name: Gregory Snyder MRN: [id number] DOB: [birth year] Today's Date: 01/20/2022   History of Present Illness Pt is a 46 y/o male who presents with tongue and throat swelling, LE swelling with a 33 pound weight gain in the last 15 days PTA. 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    PT Comments    Initiated gait training without AD as he is unable to have at next venue. Patient able to ambulate 100' with no AD and supervision. Patient does endorse L LE pain and dizziness/lightheadedness during mobility. After seated rest break, patient able to participate in picking up objects, reaching outside BOS/overhead, and mini squats. Deferred further due to fatigue. Patient was pleasant and cooperative throughout session. D/c plan remains appropriate.     Recommendations for follow up therapy are one component of a multi-disciplinary discharge planning process, led by the attending physician.  Recommendations may be updated based on patient status, additional functional criteria and insurance authorization.  Follow Up Recommendations  Outpatient PT     Assistance Recommended at Discharge Intermittent Supervision/Assistance  Patient can return home with the following A little help with walking and/or transfers;A little help with bathing/dressing/bathroom;Assistance with cooking/housework;Assist for transportation;Help with stairs or ramp for entrance   Equipment Recommendations  None recommended by PT    Recommendations for Other Services       Precautions / Restrictions Precautions Precautions: Fall Precaution Comments: Trach; OK to sit in chair if close enough to be for shackles to be placed. Restrictions Weight Bearing Restrictions: No     Mobility  Bed Mobility Overal bed mobility: Modified Independent                   Transfers Overall transfer level: Modified independent Equipment used: None                    Ambulation/Gait Ambulation/Gait assistance: Supervision Gait Distance (Feet): 100 Feet Assistive device: None Gait Pattern/deviations: Step-through pattern, Decreased stride length, Decreased stance time - left Gait velocity: decreased     General Gait Details: supervision for safety. decreased stance time on L. Practiced without RW as he is unable to have in prison. Patient reports dizziness and lightheadedness upon return to room.   Stairs             Wheelchair Mobility    Modified Rankin (Stroke Patients Only)       Balance Overall balance assessment: Needs assistance Sitting-balance support: Feet supported, No upper extremity supported Sitting balance-Leahy Scale: Good     Standing balance support: No upper extremity supported, During functional activity Standing balance-Leahy Scale: Fair                 High Level Balance Comments: Picking up objects off floor, outside of BOS, and overhead (R shoulder with ROM limitations)            Cognition Arousal/Alertness: Awake/alert Behavior During Therapy: WFL for tasks assessed/performed Overall Cognitive Status: Within Functional Limits for tasks assessed                                          Exercises General Exercises - Lower Extremity Mini-Sqauts: 10 reps    General Comments        Pertinent Vitals/Pain Pain Assessment  Pain Assessment: Faces Faces Pain Scale: Hurts little more Pain Location: B thigh/LE cramping with mobility Pain Descriptors / Indicators: Grimacing, Cramping Pain Intervention(s): Monitored during session    Home Living                          Prior Function            PT Goals (current goals can now be found in the care plan section) Acute Rehab PT Goals PT Goal Formulation: With patient Time For Goal Achievement:  01/30/22 Potential to Achieve Goals: Good Progress towards PT goals: Progressing toward goals    Frequency    Min 3X/week      PT Plan Current plan remains appropriate    Co-evaluation              AM-PAC PT "6 Clicks" Mobility   Outcome Measure  Help needed turning from your back to your side while in a flat bed without using bedrails?: None Help needed moving from lying on your back to sitting on the side of a flat bed without using bedrails?: None Help needed moving to and from a bed to a chair (including a wheelchair)?: A Little Help needed standing up from a chair using your arms (e.g., wheelchair or bedside chair)?: None Help needed to walk in hospital room?: A Little Help needed climbing 3-5 steps with a railing? : A Lot 6 Click Score: 20    End of Session Equipment Utilized During Treatment: Gait belt Activity Tolerance: Patient tolerated treatment well Patient left: in chair;with call bell/phone within reach (Corporate treasurer present) Nurse Communication: Mobility status PT Visit Diagnosis: Unsteadiness on feet (R26.81);Difficulty in walking, not elsewhere classified (R26.2)     Time: 8309-4076 PT Time Calculation (min) (ACUTE ONLY): 28 min  Charges:  $Gait Training: 8-22 mins $Therapeutic Exercise: 8-22 mins                     Shazia Mitchener A. Gilford Rile PT, DPT Acute Rehabilitation Services Pager [phone] Office [phone]    Linna Hoff 01/20/2022, 5:12 PM

## 2022-01-20 NOTE — Progress Notes (Signed)
Subjective:  Patient was seen at bedside during rounds this morning.    Patient states that his pain has eased up a little bit.  Otherwise, his neck swelling has been the same.  Continues to endorse right-sided neck tenderness. No other complaints or concerns at this time.  Objective:  Vital signs in last 24 hours: Vitals:   01/20/22 0022 01/20/22 0410 01/20/22 0424 01/20/22 0913  BP:   96/63 103/67  Pulse: 67 64 64 73  Resp: '20 18 18 16  '$ Temp:   (!) 97.5 F (36.4 C) 98.5 F (36.9 C)  TempSrc:   Oral Oral  SpO2: 98% 97% 96% 96%  Weight:      Height:       General: NAD, nl appearance, lying comfortably in bed, pt is aphonic HE: Normocephalic, atraumatic, EOMI, Conjunctivae normal ENT: Poor dentition. Neck is tender to touch. The right side of the neck is mildly swollen compared to the left side Cardiovascular: Normal rate, regular rhythm. No murmurs, rubs, or gallops Pulmonary: Effort normal, breath sounds normal. 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  He presented with facial swelling/numbness which started after tracheostomy tube was placed in his stoma after he was transported to [facility] w/o a device in  his stoma. He was afebrile, HDS, no leukocytosis, leukopenia however with decreased ALC only. Pt was breathing well on exam, but continues to have difficulty fully opening his mouth due to significant swelling. CT soft tissue neck was obtained and was concerning for soft tissue infection without a drainable fluid collection. ENT was consulted here and performed fiberoptic exam which did not show any obvious evidence of recurrent disease. There has been some improvement in his neck swelling after antibiotics and steroids. Unclear exactly what else could be contributing to his swelling, so we will reach out to his [facility] onc/surgery teams again to help guide our discharge plan moving forward.  -Will reach out to [facility] providers again to help guide our discharge plan -Will continue to monitor his respiratory status closely -Dysphagia 3 diet -Appreciate ENT recommendations -Appreciate SLP eval and recs -Trend CBC  Peripheral neuropathy Hypothyroidism s/p total thryoidectomy  Hypocalcemia The patient presents here with a ~ 2 week history of loss of balance with ambulation as well as numbness and tingling of the right side of his face as well as the distal extremities. 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 Vitamin B12 and folate are within normal limits, negative HIV antibody. TSH is 98.4, free T4 is < 0.25. Calcium levels are low, most recently at 8.4. Suspect his symptoms (peripheral neuropathy, facial numbness, leg cramping) are due to combination of his severe hypothyroidism and hypocalcemia after no hormone replacement or vitamin supplementation after removal of his thyroid and partial parathryoids.  -Levothyroxine 131 mcg daily -Calcium supplementation daily  Leukopenia, resolved though inappropriately normal Presented here with a WBC of 2.3 (Differential notable for ALC only being decreased). Review of records shows that his WBC were decreased in 2022. HIV antibody is  negative. Peripheral smear shows mild leukopenia with left shift but no blasts or abnormal morphology. Could be 2/2 his radiation therapy for cancer tx vs hepatitis infection. Leukopenia resolved this admission however he was also started on steroids for his neck swelling this admission. Most recent WBC of 4.9. -CTM  Normocytic anemia Hemoglobin on admit of 9.8. MCV 98.1. Hemoglobin ~10-11 in August 2022. No overt signs of bleeding. Reticulocyte labs reflect a hypoproliferative state. Peripheral smear shows mild leukopenia with left shift and normochromic normocytic anemia but no blasts or abnormal morphology. Differential diagnosis includes anemia of chronic disease in the setting of longstanding infection.  -Trend CBC  Hepatitis B/C, untreated Has a history of previously untreated hepatitis B/C.  Per review of records from August 2022, a GI/hepatology referral was considered as outpatient, however cannot locate any GI consult records. Labs here reveal a nonreactive hep B surface antigen, reactive hep B surface antibody, indicating immunity likely from his prior infection. Hepatitis C antibody is reactive. CMP reveals mildly elevated AST to 48, normal ALT 32. HCV RNA was 8.4 million copies. Will need to coordinate management of his HCV with jail moving forward. -HCV Fibrosure pending  Prior to Admission Living Arrangement: Currently incarcerated at [facility] Anticipated Discharge Location: [facility] Barriers to Discharge: Dispo planning with [facility] Dispo: Anticipated discharge pending dispo planning  Orvis Brill, MD 01/20/2022, 11:02 AM Pager: [phone]  After 5pm on weekdays and 1pm on weekends: On Call pager [phone]

## 2022-01-20 NOTE — Progress Notes (Signed)
Nutrition Follow-up  DOCUMENTATION CODES:   Not applicable  INTERVENTION:   Continue Regular diet -Assistance with ordering meals; given swallowing issues, nutrition concerns, pt may order his meals (no restrictions) so that pt may select items that are easiest for him to swallow. Pt is allowed to order items such as pudding, ice cream etc. Note made in [facility] Epic and [facility] Touch System   Ensure Enlive po QID, each supplement provides 350 kcal and 20 grams of protein.    NUTRITION DIAGNOSIS:   Increased nutrient needs related to acute illness, chronic illness as evidenced by estimated needs.  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 Pt is currently incarcerated at [facility]; police officer at bedside.  Noted diet downgraded to Dysphagia 3, thins by SLP. Pt indicates she is tolerating the chopped food better.   Pt with good appetite; eating well, Ate 100% at lunch today. Pt also reports he is drinking the Ensure shakes well. Pt also indicates that he has been able to order his meals without difficulty.   No weight since 5/18  Labs: reviewed Meds: folic acid, thiamine    Diet Order:   Diet Order             DIET DYS 3 Room service appropriate? Yes; Fluid consistency: Thin  Diet effective now                   EDUCATION NEEDS:   Education needs have been addressed  Skin:  Skin Assessment: Reviewed RN Assessment  Last BM:  5/20  Height:   Ht Readings from Last 1 Encounters:  01/13/22 5' 9" (1.753 m)    Weight:   Wt Readings from Last 1  Encounters:  01/14/22 85.3 kg     BMI:  Body mass index is 27.77 kg/m.  Estimated Nutritional Needs:   Kcal:  2300-2500 kcals  Protein:  125-140 g  Fluid:  >/= 2 L    Cate Rhodes MS, RDN, LDN, CNSC Registered Dietitian III Clinical Nutrition RD Pager and On-Call Pager Number Located in Amion   

## 2022-01-21 DIAGNOSIS — L03221 Cellulitis of neck: Secondary | ICD-10-CM | POA: Diagnosis not present

## 2022-01-21 LAB — BASIC METABOLIC PANEL
Anion gap: 8 (ref 5–15)
BUN: 23 mg/dL — ABNORMAL HIGH (ref 6–20)
CO2: 35 mmol/L — ABNORMAL HIGH (ref 22–32)
Calcium: 8.3 mg/dL — ABNORMAL LOW (ref 8.9–10.3)
Chloride: 98 mmol/L (ref 98–111)
Creatinine, Ser: 0.88 mg/dL (ref 0.61–1.24)
GFR, Estimated: >60 mL/min (ref >60)
Glucose, Bld: 77 mg/dL (ref 70–99)
Potassium: 3.6 mmol/L (ref 3.5–5.1)
Sodium: 141 mmol/L (ref 135–145)

## 2022-01-21 LAB — CBC
HCT: 29.1 % — ABNORMAL LOW (ref 39.0–52.0)
Hemoglobin: 9.3 g/dL — ABNORMAL LOW (ref 13.0–17.0)
MCH: 31.5 pg (ref 26.0–34.0)
MCHC: 32 g/dL (ref 30.0–36.0)
MCV: 98.6 fL (ref 80.0–100.0)
Platelets: 201 10*3/uL (ref 150–400)
RBC: 2.95 MIL/uL — ABNORMAL LOW (ref 4.22–5.81)
RDW: 14.6 % (ref 11.5–15.5)
WBC: 4.2 10*3/uL (ref 4.0–10.5)
nRBC: 2.6 % — ABNORMAL HIGH (ref 0.0–0.2)

## 2022-01-21 MED ORDER — CYCLOBENZAPRINE HCL 5 MG PO TABS
5.0000 mg | ORAL_TABLET | Freq: Three times a day (TID) | ORAL | Status: AC
  Administered 2022-01-21 – 2022-01-22 (×3): 5 mg via ORAL
  Filled 2022-01-21 (×3): qty 1

## 2022-01-21 NOTE — Progress Notes (Signed)
Subjective:  Patient was seen at bedside during rounds this morning.    Patient states that his neck pain was relieved with flexeril. He has no specific complaints or concerns at this time.  Objective:  Vital signs in last 24 hours: Vitals:   01/21/22 0350 01/21/22 0544 01/21/22 0751 01/21/22 0947  BP:  102/70  99/62  Pulse: 63 62 84 75  Resp: '16 16 17 18  '$ Temp:  98 F (36.7 C)  97.7 F (36.5 C)  TempSrc:  Oral  Oral  SpO2: 96% 94%  97%  Weight:      Height:       General: NAD, nl appearance, lying comfortably in bed, pt is aphonic HE: Normocephalic, atraumatic, EOMI, Conjunctivae normal ENT: Poor dentition. Neck is tender to touch. The right side of the neck is mildly swollen compared to the left side Cardiovascular: Normal rate, regular rhythm. No murmurs, rubs, or gallops Pulmonary: Effort normal, breath sounds normal. 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  Presented with concerns for ludwig angina and is s/p antibiotics as well as steroid dosepak with mild improvement of his symptoms. He is medically stable for discharge, but we are trying to arrange outpatient follow-up with [facility] ENT prior to him  leaving the hospital. We are still trying to coordinate safe discharge with [facility] ENT and with his jail. -Dysphagia 3 diet -Appreciate ENT recommendations -Appreciate SLP eval and recs -Trend CBC  Peripheral neuropathy Hypothyroidism s/p total thryoidectomy  Hypocalcemia The patient presents here with a ~ 2 week history of loss of balance with ambulation as well as numbness and tingling of the right side of his face as well as the distal extremities. 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 Vitamin B12 and folate are within normal limits, negative HIV antibody. TSH is 98.4, free T4 is < 0.25. Calcium levels are low, most recently at 8.4. Suspect his symptoms (peripheral neuropathy, facial numbness, leg cramping) are due to combination of his severe hypothyroidism and hypocalcemia after no hormone replacement or vitamin supplementation after removal of his thyroid and partial parathryoids.  -Levothyroxine 131 mcg daily -Calcium supplementation daily  Leukopenia, resolved though inappropriately normal Presented here with a WBC of 2.3 (Differential notable for ALC only being decreased). Review of records shows that his WBC were decreased in 2022. HIV antibody is negative. Peripheral smear shows mild leukopenia with left shift but no blasts or abnormal morphology. Could be 2/2 his radiation therapy for cancer tx vs hepatitis infection. Leukopenia resolved this admission however he recently received steroids for his neck swelling this admission. Most recent WBC of 4.2. -CTM  Normocytic anemia Hemoglobin on admit of 9.8. MCV 98.1. Hemoglobin ~10-11 in August 2022. No overt signs of bleeding. Reticulocyte labs reflect a hypoproliferative state. Peripheral smear shows mild leukopenia with left  shift and normochromic normocytic anemia but no blasts or abnormal morphology. Differential diagnosis includes anemia of chronic disease in the setting of longstanding infection.  -Trend  CBC  Hepatitis B/C, untreated Has a history of previously untreated hepatitis B/C.  Per review of records from August 2022, a GI/hepatology referral was considered as outpatient, however cannot locate any GI consult records. Labs here reveal a nonreactive hep B surface antigen, reactive hep B surface antibody, indicating immunity likely from his prior infection. Hepatitis C antibody is reactive. CMP reveals mildly elevated AST to 48, normal ALT 32. HCV RNA was 8.4 million copies. Will need to coordinate management of his HCV with his jail moving forward. -HCV Fibrosure pending  Prior to Admission Living Arrangement: Currently incarcerated at [facility] Anticipated Discharge Location: [facility] Barriers to Discharge: Dispo planning with [facility] Dispo: Anticipated discharge pending dispo planning  Orvis Brill, MD 01/21/2022, 10:35 AM Pager: [phone]  After 5pm on weekdays and 1pm on weekends: On Call pager [phone]

## 2022-01-21 NOTE — Progress Notes (Signed)
Mobility Specialist Progress Note   01/21/22 1617  Mobility  Activity Ambulated independently in hallway  Level of Assistance Contact guard assist, steadying assist  Assistive Device  (HHA)  Distance Ambulated (ft) 180 ft  Activity Response Tolerated well  $Mobility charge 1 Mobility   Received in bed having mild pain(7/10) in LLE but agreeable. Needing no physical assistance OOB but contact guard for safety during ambulation. Once in hall, pt using railing in bouts as HHA. No faults throughout but pt c/o numbness and tingling in fingers + fingers visibly white. Returned back to chair w/ needs met and RN notified about hands.  Holland Falling Mobility Specialist Phone Number [phone]

## 2022-01-21 NOTE — Progress Notes (Signed)
Speech Language Pathology Treatment: Cognitive-Linquistic;Dysphagia  Patient Details Name: Gregory Snyder MRN: [id number] DOB: [birth year] Today's Date: 01/21/2022 Time: 7564-3329 SLP Time Calculation (min) (ACUTE ONLY): 30 min  Assessment / Plan / Recommendation Clinical Impression  Pt was seen for f/u after having received laryngectomy care supplies. Pt's lunch tray was also observed to be cleared at bedside. Pt says that his swallowing feels to be about the same. No difficulties were observed when drinking thin liquids, but he said the meat on his lunch tray was challenging. Will adjust order to request meats to be more finely chopped with extra gravy added. Pt thinks this may help.  Pt also reports that he was able to better clean his voice prosthesis but that voicing was still a challenge. He also noted leaking from his prosthesis after cleaning it, feeling it and noticing the subsequent coughing. He did not believe that it was leaking anymore. Pt removed his lary tube (now using a fenestrated one) so SLP could visualize his prosthesis. He drank thin liquids with no overt leaking through or around the stoma observed. Note that pt says he does not have any of his plugs - discussed with Gregory Snyder to try to order some for him. SLP cleaned the lary tube and pt replaced it. He tried voicing through his prosthesis and was not able to. Encouraged pt to not clean the prosthesis itself as it seems to be causing episodes of leaking. Pt is agreeable to SLP reaching out to his primary SLP at [facility], as it does seem that his prosthesis may need to be replaced as soon as able.   After phone call with his primary SLP, he is in agreement with inpatient POC and does agree that pt needs to be seen as soon as able on an outpatient basis. Pt has been provided with name and contact information to be able to set up an appointment. This may need to be coordinated with personnel at the jail if he remains incarcerated.       HPI HPI: Pt is a 46 yo male presenting with pain and swelling in his floor of mouth and neck. CT Neck concerning for soft tissue infection and also showed extensive post-surgical changes (from May 2022), including partial pharyngectomy, total laryngectomy, thyroidectomy, base of tongue resection, and nodal dissections. Most recent SLP notes at [facility] are from August 2022, at which time pt had a larytube (10/55) and was using "home" HMEs. He was starting to use TEP (17 [place], 10 mm [employer] Vega +1 shim). On 12/28/21, pt lost his larytube and upon arrival to the ER, was given an #8 Shiley.      SLP Plan  Continue with current plan of care      Recommendations for follow up therapy are one component of a multi-disciplinary discharge planning process, led by the attending physician.  Recommendations may be updated based on patient status, additional functional criteria and insurance authorization.    Recommendations  Diet recommendations: Dysphagia 3 (mechanical soft);Thin liquid (finely chop meats and add extra gravy) Liquids provided via: Cup;Straw Medication Administration: Whole meds with liquid Supervision: Patient able to self feed Compensations: Small sips/bites;Follow solids with liquid Postural Changes and/or Swallow Maneuvers: Seated upright 90 degrees                Oral Care Recommendations: Oral care BID Follow Up Recommendations: Outpatient SLP Assistance recommended at discharge: PRN SLP Visit Diagnosis: Aphonia (R49.1) Plan: Continue with current plan of care  Osie Bond., M.A. [place] Office [phone]  Secure chat preferred   01/21/2022, 2:07 PM

## 2022-01-21 NOTE — Progress Notes (Signed)
Patient c/o dizziness and tingling/numbness in fingers while ambulating with mobility tech. Patient reports fingers turning blue. This RN assessed hands once patient was in chair, Hands warm bilaterally, slow cap refill. Dr. Lorin Glass made aware.

## 2022-01-21 NOTE — Plan of Care (Signed)
  Problem: Education: Goal: Knowledge of General Education information will improve Description: Including pain rating scale, medication(s)/side effects and non-pharmacologic comfort measures Outcome: Completed/Met

## 2022-01-22 ENCOUNTER — Emergency Department (HOSPITAL_COMMUNITY)
Admission: EM | Admit: 2022-01-22 | Discharge: 2022-01-23 | Attending: Emergency Medicine | Admitting: Emergency Medicine

## 2022-01-22 ENCOUNTER — Other Ambulatory Visit: Payer: Self-pay

## 2022-01-22 ENCOUNTER — Encounter (HOSPITAL_COMMUNITY): Payer: Self-pay

## 2022-01-22 ENCOUNTER — Other Ambulatory Visit (HOSPITAL_COMMUNITY): Payer: Self-pay

## 2022-01-22 ENCOUNTER — Emergency Department (HOSPITAL_COMMUNITY)

## 2022-01-22 DIAGNOSIS — Z9889 Other specified postprocedural states: Secondary | ICD-10-CM

## 2022-01-22 DIAGNOSIS — R29810 Facial weakness: Secondary | ICD-10-CM | POA: Diagnosis not present

## 2022-01-22 DIAGNOSIS — L03221 Cellulitis of neck: Secondary | ICD-10-CM | POA: Diagnosis not present

## 2022-01-22 DIAGNOSIS — G629 Polyneuropathy, unspecified: Secondary | ICD-10-CM

## 2022-01-22 DIAGNOSIS — R202 Paresthesia of skin: Secondary | ICD-10-CM | POA: Diagnosis not present

## 2022-01-22 DIAGNOSIS — Z85828 Personal history of other malignant neoplasm of skin: Secondary | ICD-10-CM | POA: Insufficient documentation

## 2022-01-22 LAB — CBC
HCT: 29.1 % — ABNORMAL LOW (ref 39.0–52.0)
Hemoglobin: 9.1 g/dL — ABNORMAL LOW (ref 13.0–17.0)
MCH: 31.2 pg (ref 26.0–34.0)
MCHC: 31.3 g/dL (ref 30.0–36.0)
MCV: 99.7 fL (ref 80.0–100.0)
Platelets: 226 10*3/uL (ref 150–400)
RBC: 2.92 MIL/uL — ABNORMAL LOW (ref 4.22–5.81)
RDW: 14.8 % (ref 11.5–15.5)
WBC: 3.9 10*3/uL — ABNORMAL LOW (ref 4.0–10.5)
nRBC: 1.5 % — ABNORMAL HIGH (ref 0.0–0.2)

## 2022-01-22 LAB — HCV FIBROSURE
ALPHA 2-MACROGLOBULINS, QN: 223 mg/dL (ref 110–276)
ALT (SGPT) P5P: 57 IU/L — ABNORMAL HIGH (ref 0–55)
Apolipoprotein A-1: 182 mg/dL — ABNORMAL HIGH (ref 101–178)
Bilirubin, Total: 0.1 mg/dL (ref 0.0–1.2)
Fibrosis Score: 0.06 (ref 0.00–0.21)
GGT: 41 IU/L (ref 0–65)
Haptoglobin: 174 mg/dL (ref 23–355)
Necroinflammat Activity Score: 0.26 — ABNORMAL HIGH (ref 0.00–0.17)

## 2022-01-22 MED ORDER — GABAPENTIN 600 MG PO TABS: 600.0000 mg | ORAL_TABLET | Freq: Every day | ORAL | 3 refills | Status: DC

## 2022-01-22 MED ORDER — VITAMIN D (ERGOCALCIFEROL) 1.25 MG (50000 UNIT) PO CAPS
ORAL_CAPSULE | ORAL | 0 refills | Status: DC
  Filled 2022-01-22: qty 4, 28d supply, fill #0

## 2022-01-22 MED ORDER — GABAPENTIN 600 MG PO TABS
600.0000 mg | ORAL_TABLET | Freq: Every day | ORAL | 3 refills | Status: DC
  Filled 2022-01-22: qty 30, 30d supply, fill #0

## 2022-01-22 MED ORDER — LEVOTHYROXINE SODIUM 137 MCG PO TABS: 137.0000 ug | ORAL_TABLET | Freq: Every day | ORAL | 3 refills | Status: DC

## 2022-01-22 MED ORDER — AMITRIPTYLINE HCL 10 MG PO TABS: 10.0000 mg | ORAL_TABLET | Freq: Every day | ORAL | 0 refills | Status: DC

## 2022-01-22 MED ORDER — VITAMIN B-12 1000 MCG PO TABS
1000.0000 ug | ORAL_TABLET | Freq: Every day | ORAL | 0 refills | Status: DC
  Filled 2022-01-22: qty 30, 30d supply, fill #0

## 2022-01-22 MED ORDER — VITAMIN D (ERGOCALCIFEROL) 1.25 MG (50000 UNIT) PO CAPS: 50000.0000 [IU] | ORAL_CAPSULE | ORAL | 0 refills | Status: DC

## 2022-01-22 MED ORDER — VITAMIN D (ERGOCALCIFEROL) 1.25 MG (50000 UNIT) PO CAPS
50000.0000 [IU] | ORAL_CAPSULE | ORAL | 0 refills | Status: DC
  Filled 2022-01-22: qty 8, 56d supply, fill #0

## 2022-01-22 MED ORDER — VITAMIN B-12 1000 MCG PO TABS: 1000.0000 ug | ORAL_TABLET | Freq: Every day | ORAL | 0 refills | Status: AC

## 2022-01-22 MED ORDER — LEVOTHYROXINE SODIUM 137 MCG PO TABS
137.0000 ug | ORAL_TABLET | Freq: Every day | ORAL | 3 refills | Status: DC
  Filled 2022-01-22: qty 30, 30d supply, fill #0

## 2022-01-22 NOTE — ED Triage Notes (Signed)
Pt BIB police. Pt states that he needs an ambu bag and suction for his trach, the jail has not supplied him with one. Pt reports having numbness in his legs along with facial drooping x 1 week.

## 2022-01-22 NOTE — ED Provider Notes (Signed)
[facility] DEPT Provider Note   CSN: [id number] Arrival date & time: 01/22/22  2108     History {Add pertinent medical, surgical, social history, OB history to HPI:1} Chief Complaint  Patient presents with   Numbness   Facial Droop    Gregory Snyder is a 46 y.o. male.  HPI  Patient presents to the ED primarily for complaints of not having the appropriate equipment at jail.  They state that he needs an Ambu bag and suction for his trach.  Patient also states he has had numbness in his legs as well as facial drooping on going for the last week.  Patient is not having any trouble walking.  He does not have any weakness in his upper extremities.  Patient does have a complex history of Ludewig's angina, throat cancer, aspiration pneumonitis and peripheral neuropathy.  Previous records reviewed and patient was receiving care at [facility].  Patient was also recently admitted to the hospital on May 17 and was discharged on May 26 today.  Per the medical records patient was treated for Ludewig's angina, peripheral neuropathy hypothyroidism hypocalcia  Home Medications Prior to Admission medications   Medication Sig Start Date End Date Taking? Authorizing Provider  amitriptyline (ELAVIL) 10 MG tablet Take 1 tablet (10 mg total) by mouth at bedtime. Patient not taking: Reported on 01/22/2022 01/22/22   Rick Duff, MD  levothyroxine (SYNTHROID) 137 MCG tablet Take 1 tablet (137 mcg total) by mouth daily before breakfast. Patient not taking: Reported on 01/22/2022 01/22/22   Rick Duff, MD  vitamin B-12 (CYANOCOBALAMIN) 1000 MCG tablet Take 1 tablet (1,000 mcg total) by mouth daily. Patient not taking: Reported on 01/22/2022 01/22/22 04/22/22  Rick Duff, MD  Vitamin D, Ergocalciferol, (DRISDOL) 1.25 MG (50000 UNIT) CAPS capsule Take 1 capsule (50,000 Units total) by mouth every 7 (seven) days. Patient not taking: Reported on 01/22/2022 01/22/22    Rick Duff, MD      Allergies    Motrin [ibuprofen]    Review of Systems   Review of Systems  Constitutional:  Negative for fever.   Physical Exam Updated Vital Signs BP 105/87   Pulse 94   Temp 98.4 F (36.9 C) (Oral)   Resp 18   SpO2 97%  Physical Exam Vitals and nursing note reviewed.  Constitutional:      Appearance: He is well-developed.  HENT:     Head: Normocephalic and atraumatic.     Comments: Tracheostomy noted, no respiratory difficulty    Right Ear: External ear normal.     Left Ear: External ear normal.  Eyes:     General: No scleral icterus.       Right eye: No discharge.        Left eye: No discharge.     Conjunctiva/sclera: Conjunctivae normal.  Neck:     Trachea: No tracheal deviation.  Cardiovascular:     Rate and Rhythm: Normal rate and regular rhythm.  Pulmonary:     Effort: Pulmonary effort is normal. No respiratory distress.     Breath sounds: Normal breath sounds. No stridor. No wheezing or rales.  Abdominal:     General: Bowel sounds are normal. There is no distension.     Palpations: Abdomen is soft.     Tenderness: There is no abdominal tenderness. There is no guarding or rebound.  Musculoskeletal:        General: No tenderness or deformity.     Cervical back: Neck supple.  Skin:  General: Skin is warm and dry.     Findings: No rash.  Neurological:     General: No focal deficit present.     Mental Status: He is alert.     Cranial Nerves: No cranial nerve deficit (no facial droop, extraocular movements intact, no slurred speech).     Sensory: No sensory deficit.     Motor: No abnormal muscle tone or seizure activity.     Coordination: Coordination normal.     Comments: Questionable right-sided facial asymmetry  Psychiatric:        Mood and Affect: Mood normal.    ED Results / Procedures / Treatments   Labs (all labs ordered are listed, but only abnormal results are displayed) Labs Reviewed  CBC  COMPREHENSIVE  METABOLIC PANEL    EKG None  Radiology No results found.  Procedures Procedures  {Document cardiac monitor, telemetry assessment procedure when appropriate:1}  Medications Ordered in ED Medications - No data to display  ED Course/ Medical Decision Making/ A&P                           Medical Decision Making Amount and/or Complexity of Data Reviewed Labs: ordered. Radiology: ordered.   ***  {Document critical care time when appropriate:1} {Document review of labs and clinical decision tools ie heart score, Chads2Vasc2 etc:1}  {Document your independent review of radiology images, and any outside records:1} {Document your discussion with family members, caretakers, and with consultants:1} {Document social determinants of health affecting pt's care:1} {Document your decision making why or why not admission, treatments were needed:1} Final Clinical Impression(s) / ED Diagnoses Final diagnoses:  None    Rx / DC Orders ED Discharge Orders     None

## 2022-01-22 NOTE — Progress Notes (Signed)
Physical Therapy Treatment Patient Details Name: Gregory Snyder MRN: [id number] DOB: 02-18-1976 Today's Date: 01/22/2022   History of Present Illness Pt is a 46 y/o male who presents with tongue and throat swelling, LE swelling with a 33 pound weight gain in the last 15 days PTA. 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    PT Comments    Patient continues to progress with mobility but still complains of dizziness. Able to ambulate with supervision and no AD. Performed standing exercises but complains of fatigue and wanting to rest. D/c plan remains appropriate.     Recommendations for follow up therapy are one component of a multi-disciplinary discharge planning process, led by the attending physician.  Recommendations may be updated based on patient status, additional functional criteria and insurance authorization.  Follow Up Recommendations  Outpatient PT     Assistance Recommended at Discharge Intermittent Supervision/Assistance  Patient can return home with the following A little help with walking and/or transfers;A little help with bathing/dressing/bathroom;Assistance with cooking/housework;Assist for transportation;Help with stairs or ramp for entrance   Equipment Recommendations  None recommended by PT    Recommendations for Other Services       Precautions / Restrictions Precautions Precautions: Fall Precaution Comments: Trach; OK to sit in chair if close enough to be for shackles to be placed. Restrictions Weight Bearing Restrictions: No     Mobility  Bed Mobility Overal bed mobility: Modified Independent                  Transfers Overall transfer level: Modified independent Equipment used: None                    Ambulation/Gait Ambulation/Gait assistance: Supervision Gait Distance (Feet): 150 Feet Assistive device: None Gait  Pattern/deviations: Step-through pattern, Decreased stride length, Decreased stance time - left Gait velocity: decreased     General Gait Details: supervision for safety. Reports dizziness   Stairs             Wheelchair Mobility    Modified Rankin (Stroke Patients Only)       Balance Overall balance assessment: Mild deficits observed, not formally tested                                          Cognition Arousal/Alertness: Awake/alert Behavior During Therapy: WFL for tasks assessed/performed Overall Cognitive Status: Within Functional Limits for tasks assessed                                          Exercises General Exercises - Lower Extremity Toe Raises: Both, 10 reps, Standing Heel Raises: Both, 10 reps, Standing Mini-Sqauts: 10 reps    General Comments        Pertinent Vitals/Pain Pain Assessment Pain Assessment: Faces Faces Pain Scale: No hurt Pain Intervention(s): Monitored during session    Home Living                          Prior Function            PT Goals (current goals can now be found in the care plan section) Acute Rehab PT Goals PT Goal Formulation:  With patient Time For Goal Achievement: 01/30/22 Potential to Achieve Goals: Good Progress towards PT goals: Progressing toward goals    Frequency    Min 3X/week      PT Plan Current plan remains appropriate    Co-evaluation              AM-PAC PT "6 Clicks" Mobility   Outcome Measure  Help needed turning from your back to your side while in a flat bed without using bedrails?: None Help needed moving from lying on your back to sitting on the side of a flat bed without using bedrails?: None Help needed moving to and from a bed to a chair (including a wheelchair)?: A Little Help needed standing up from a chair using your arms (e.g., wheelchair or bedside chair)?: None Help needed to walk in hospital room?: A Little Help  needed climbing 3-5 steps with a railing? : A Lot 6 Click Score: 20    End of Session   Activity Tolerance: Patient tolerated treatment well Patient left: in bed;with call bell/phone within reach Nurse Communication: Mobility status PT Visit Diagnosis: Unsteadiness on feet (R26.81);Difficulty in walking, not elsewhere classified (R26.2)     Time: 2130-8657 PT Time Calculation (min) (ACUTE ONLY): 21 min  Charges:  $Therapeutic Exercise: 8-22 mins                     Glorianna Gott A. Gilford Rile PT, DPT Acute Rehabilitation Services Pager [phone] Office [phone]    Linna Hoff 01/22/2022, 5:34 PM

## 2022-01-22 NOTE — Progress Notes (Deleted)
Subjective:  Patient was seen at bedside during rounds this morning.    Patient states that his neck pain was relieved with flexeril. Complains of blue fingers that intermittently become cold. He has no other specific complaints or concerns at this time.  Objective:  Vital signs in last 24 hours: Vitals:   01/22/22 0445 01/22/22 0747 01/22/22 1000 01/22/22 1132  BP: 108/78  98/66   Pulse: 69 79 81 89  Resp: '16 17 17 18  '$ Temp: 97.7 F (36.5 C)  98.3 F (36.8 C)   TempSrc: Oral     SpO2: 100% 98% 97% 98%  Weight:      Height:       General: NAD, nl appearance, lying comfortably in bed, pt is aphonic HE: Normocephalic, atraumatic, EOMI, Conjunctivae normal ENT: Poor dentition. Neck is tender to touch. The right side of the neck is mildly swollen compared to the left side. TTP of the neck on the right side Cardiovascular: Normal rate, regular rhythm. No murmurs, rubs, or gallops Pulmonary: Effort normal, breath sounds normal. 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  Presented with concerns for ludwig angina and is s/p antibiotics as well as steroid dosepak with mild improvement of  his symptoms. He is medically stable for discharge, but we are trying to arrange outpatient follow-up with [facility] prior to him leaving the hospital. We are still trying to coordinate safe discharge with [facility] and with [facility] jail. -Dysphagia 3 diet -Appreciate ENT recommendations -Appreciate SLP eval and recs -Trend CBC  Peripheral neuropathy Hypothyroidism s/p total thryoidectomy  Hypocalcemia The patient presents here with a ~ 2 week history of loss of balance with ambulation as well as numbness and tingling of the right side of his face as well as the distal extremities. 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 Vitamin B12 and folate are within normal limits, negative HIV antibody. TSH is 98.4, free T4 is < 0.25. Calcium levels are low, most recently at 8.4. Suspect his symptoms (peripheral neuropathy, facial numbness, leg cramping) are due to combination of his severe hypothyroidism and hypocalcemia after no hormone replacement or vitamin supplementation after removal of his thyroid and partial parathryoids.  -Levothyroxine 131 mcg daily -Calcium supplementation daily  Leukopenia, resolved though inappropriately normal Presented here with a WBC of 2.3 (Differential notable for ALC only being decreased). Review of records shows that his WBC were decreased in 2022. HIV antibody is negative. Peripheral smear shows mild leukopenia with left shift but no blasts or abnormal morphology. Could be 2/2 his radiation therapy for cancer tx vs hepatitis infection. Leukopenia resolved this admission however he recently received steroids for his neck swelling this admission. Most recent WBC of 4.2. -CTM  Normocytic anemia Hemoglobin on admit of 9.8. MCV 98.1. Hemoglobin ~10-11  in August 2022. No overt signs of bleeding. Reticulocyte labs reflect a hypoproliferative state. Peripheral smear shows mild leukopenia with left shift and normochromic normocytic anemia but no blasts or  abnormal morphology. Differential diagnosis includes anemia of chronic disease in the setting of longstanding infection.  -Trend CBC  Hepatitis B/C, untreated Has a history of previously untreated hepatitis B/C.  Per review of records from August 2022, a GI/hepatology referral was considered as outpatient, however cannot locate any GI consult records. Labs here reveal a nonreactive hep B surface antigen, reactive hep B surface antibody, indicating immunity likely from his prior infection. Hepatitis C antibody is reactive. CMP reveals mildly elevated AST to 48, normal ALT 32. HCV RNA was 8.4 million copies. Will need to coordinate management of his HCV with his jail moving forward. Fibrosure labs revealed no fibrosis.  -Pt will be set up in our clinic, and at that time can start tx for his hepatitis.   Prior to Admission Living Arrangement: Currently incarcerated at [facility] Anticipated Discharge Location: [facility] Barriers to Discharge: Dispo planning with [facility]/Jail Dispo: Anticipated discharge pending dispo planning  Orvis Brill, MD 01/22/2022, 2:23 PM Pager: [phone]  After 5pm on weekdays and 1pm on weekends: On Call pager [phone]

## 2022-01-22 NOTE — Progress Notes (Signed)
DISCHARGE NOTE HOME Keinan Callan to be discharged  [facility]  per MD order. Discussed prescriptions and follow up appointments with the patient. Prescriptions given to patient; medication list explained in detail. Patient verbalized understanding.  Skin clean, dry and intact without evidence of skin break down, no evidence of skin tears noted. IV catheter discontinued intact. Site without signs and symptoms of complications. Dressing and pressure applied. Pt denies pain at the site currently. No complaints noted.  Patient free of lines, drains, and wounds.   An After Visit Summary (AVS) was printed and given to the patient. Patient escorted via wheelchair, and discharged home via private auto.  Vira Agar, RN

## 2022-01-22 NOTE — Plan of Care (Signed)

## 2022-01-22 NOTE — Discharge Summary (Addendum)
Name: Gregory Snyder MRN: [id number] DOB: [birth year] 46 y.o. PCP: System, Provider Not In  Date of Admission: 01/13/2022 11:39 AM Date of Discharge: 01/22/2022 Attending Physician: Campbell Riches, MD  Discharge Diagnosis: 1. ?Ludwig angina 2. Peripheral neuropathy 3. Hypothyroidism s/p total thryoidectomy  4. Hypocalcemia 5. Leukopenia 6. Normocytic anemia 7. Hepatitis B/C, untreated  Discharge Medications: Allergies as of 01/22/2022       Reactions   Motrin [ibuprofen] Hives        Medication List     STOP taking these medications    predniSONE 10 MG tablet Commonly known as: DELTASONE       TAKE these medications    albuterol 108 (90 Base) MCG/ACT inhaler Commonly known as: VENTOLIN HFA Inhale 2 puffs into the lungs every 6 (six) hours as needed for wheezing or shortness of breath.   gabapentin 600 MG tablet Commonly known as: NEURONTIN Take 1 tablet (600 mg total) by mouth at bedtime.   levothyroxine 137 MCG tablet Commonly known as: SYNTHROID Take 1 tablet (137 mcg total) by mouth daily before breakfast.   vitamin B-12 1000 MCG tablet Commonly known as: CYANOCOBALAMIN Take 1 tablet (1,000 mcg total) by mouth daily.   Vitamin D (Ergocalciferol) 1.25 MG (50000 UNIT) Caps capsule Commonly known as: DRISDOL Take by mouth once a week.        Disposition and follow-up:   Gregory Snyder was discharged from [facility] in Stable condition.  At the hospital follow up visit please address:  ?Ludwig angina: The patient is s/p IV antibiotics and a steroid dosepak for his Ludwig angina. There was mild to moderate improvement of his swelling after these therapies. Pt will follow with speech therapy for his dysphagia as well as [facility] E[facility] for his throat swelling. Pt will also need suctioning and a plug [facility] his jail.  Hypothyroidism s/p total thryoidectomy, Hypocalcemia, numbness and tingling of fingers of both hands: Pt to  continue levothyroxine and calcium supplementation after discharge.  Of note, on day of discharge, the patient complained of numbness/tingling of his fingers as well as coldness and color changes of the fingers. On exam, fingers were warm to touch (this was also noted by RN yesterday) with normal cap refill. Held off on starting a calcium channel blocker due to soft BP at DC. Thought to be due to his hypocalcemia. Please discuss this further at next office visit.   Hepatitis B/C, untreated: Labs as below. Will need to initiate treatment at next PCP visit [facility] clinic).  2.  Labs / imaging needed at time of follow-up: None  3.  Pending labs/ test needing follow-up: None  Follow-up Appointments:  Follow-up Information     [place].   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  He presented with facial swelling/numbness which started after tracheostomy tube was  placed in his stoma after he was transported to [facility] w/o a device in his stoma. On admit, he was afebrile, HDS, no leukocytosis, leukopenia however with decreased ALC only. Pt was breathing well on exam, but endorsed difficulty fully opening his mouth due to significant swelling. CT soft tissue neck was obtained  and was concerning for soft tissue infection without a drainable fluid collection. He was subsequently placed on IV linezolid and zosyn regimens with some clinical improvement of his swelling. ENT was consulted and performed fiberoptic exam which did not show any obvious evidence of recurrent disease. He was also placed on a steroid dosepak with mild to moderate improvement of his swelling. SLP visited the patient due to complaints of dysphagia while in the hospital and recommended a dysphagia 3 diet. He was then medically stable for discharge, though dispo was difficult due to need to coordinate with both his jail and with [facility] ENT for outpatient follow-up. Pt will follow-up with speech therapy as outpatient and with [facility] ENT as outpatient.   Peripheral neuropathy Hypothyroidism s/p total thryoidectomy  Hypocalcemia The patient presented here with a ~ 2 week history of loss of balance with ambulation as well as numbness and tingling of the right side of his face as well as the distal extremities. 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 Vitamin B12 and folate are within normal limits, negative HIV antibody. TSH is 98.4, free T4 is < 0.25. Calcium levels are low, mostly ~ 8.3. Suspect his symptoms (peripheral neuropathy, facial numbness, leg cramping) are due to combination of his severe hypothyroidism and hypocalcemia after no hormone replacement or vitamin supplementation after removal of his thyroid and partial parathryoids. He was placed on daily levothyroxine and daily calcium supplementation, to also continue after discharge.   Leukopenia, resolved here though thought to be inappropriately normal Presented here with a WBC of 2.3 (Differential notable for ALC only being decreased). Review of records shows that his WBC was decreased in 2022. HIV antibody is negative. Peripheral smear shows mild leukopenia with left shift but no blasts or abnormal morphology. Could be 2/2 his  radiation therapy for cancer tx vs hepatitis infection. Leukopenia resolved this admission however he recently received steroids for his neck swelling this admission. Most recent WBC of 4.2.   Normocytic anemia Hemoglobin on admit of 9.8. MCV 98.1. Hemoglobin ~10-11 in August 2022. No overt signs of bleeding. Reticulocyte labs reflect a hypoproliferative state. Peripheral smear shows mild leukopenia with left shift and normochromic normocytic anemia but no blasts or abnormal morphology. Differential diagnosis includes anemia of chronic disease in the setting of longstanding infection. His Hgb was trended and otherwise remained stable during this hospitalization.   Hepatitis B/C, untreated Has a history of previously untreated hepatitis B/C.  Per review of records from August 2022, a GI/hepatology referral was considered as outpatient, however cannot locate any GI consult records. Labs here reveal a nonreactive hep B surface antigen, reactive hep B surface antibody, indicating immunity likely from his prior infection. Hepatitis C antibody is reactive. CMP reveals mildly elevated AST to 48, normal ALT 32. HCV RNA was 8.4 million copies. Fibrosure labs revealed no fibrosis. Pt will be set up in our clinic, and at that time can start tx for his hepatitis.   Discharge Exam:   BP 98/66 (BP Location: Left Arm)   Pulse 89   Temp 98.3 F (36.8 C)   Resp 18   Ht '5\' 9"'$  (1.753 m)  Wt 85.3 kg   SpO2 98%   BMI 27.77 kg/m  Discharge exam:  General: NAD, nl appearance, lying comfortably in bed, pt is aphonic HE: Normocephalic, atraumatic, EOMI, Conjunctivae normal ENT: Poor dentition. Neck is tender to touch. The right side of the neck is mildly swollen compared to the left side Cardiovascular: Normal rate, regular rhythm. No murmurs, rubs, or gallops Pulmonary: Effort normal, breath sounds normal. No wheezes, rales, or rhonchi Abdominal: Soft, nontender, bowel sounds present Musculoskeletal: no swelling,  deformity, injury or tenderness in extremities Skin: Warm, dry, no bruising, erythema, or rash Psychiatric: Normal mood and affect    Pertinent Labs, Studies, and Procedures:  CT HEAD WO CONTRAST (5MM)  Result Date: 01/13/2022 CLINICAL DATA:  History of throat cancer with recent tracheostomy replacement 12/28/2021, oral swelling, weight gain, neurologic deficit EXAM: CT HEAD WITHOUT CONTRAST TECHNIQUE: Contiguous axial images were obtained from the base of the skull through the vertex without intravenous contrast. RADIATION DOSE REDUCTION: This exam was performed according to the departmental dose-optimization program which includes automated exposure control, adjustment of the mA and/or kV according to patient size and/or use of iterative reconstruction technique. COMPARISON:  05/09/2019 FINDINGS: Brain: No acute infarct or hemorrhage. Lateral ventricles and midline structures are unremarkable. No acute extra-axial fluid collections. No mass effect. Vascular: No hyperdense vessel or unexpected calcification. Skull: Normal. Negative for fracture or focal lesion. Sinuses/Orbits: No acute finding. Small metallic foreign body lateral to the right orbit may be related to piercing, and is stable. Other: None. IMPRESSION: 1. No acute intracranial process. Electronically Signed   By: Randa Ngo M.D.   On: 01/13/2022 18:37   CT Soft Tissue Neck W Contrast  Result Date: 01/13/2022 CLINICAL DATA:  Laryngeal edema previous throat cancer, trach, new tongue swelling, unable to open mouth more than two fingers worth EXAM: CT NECK WITH CONTRAST TECHNIQUE: Multidetector CT imaging of the neck was performed using the standard protocol following the bolus administration of intravenous contrast. RADIATION DOSE REDUCTION: This exam was performed according to the departmental dose-optimization program which includes automated exposure control, adjustment of the mA and/or kV according to patient size and/or use of  iterative reconstruction technique. CONTRAST:  87m OMNIPAQUE IOHEXOL 300 MG/ML  SOLN COMPARISON:  None Available. FINDINGS: Extensive postsurgical changes, including partial pharyngectomy, total laryngectomy, thyroidectomy, and base of tongue resection. Large flap within the region of resection. There is soft tissue thickening and edema involving the residual floor mouth musculature, residual tongue and oropharynx there also is small volume of prevertebral fluid and soft tissue encasing the left carotid. Mild subcutaneous edema throughout the neck. Bilateral nodal dissections. No evidence of pathologically enlarged lymph nodes. Resection of the right submandibular gland. Left submandibular gland and parotid glands are unremarkable. Tracheostomy tube is partially imaged in the lower trachea. No consolidation in the visualized lung apices. No evidence of acute osseous abnormality. Major arteries are grossly patent in the neck. IMPRESSION: 1. Extensive postsurgical changes, including partial pharyngectomy, total laryngectomy, thyroidectomy, base of tongue resection, and nodal dissections. 2. Soft tissue thickening and edema involving the residual floor mouth musculature, tongue and oropharynx with small volume of prevertebral fluid, soft tissue encasing the left carotid, and mild diffuse subcutaneous edema. These findings could be secondary to any combination of post treatment change or superimposed infection. No discrete, drainable fluid collection. No clearly masslike/nodular soft tissue component to suggest recurrent malignancy, but evaluation is limited given no post treatment prior for comparison. Direct mucosal visualization and continue follow-up imaging is  recommended. Electronically Signed   By: Margaretha Sheffield M.D.   On: 01/13/2022 15:00   DG Chest Portable 1 View  Result Date: 01/13/2022 CLINICAL DATA:  sob EXAM: PORTABLE CHEST 1 VIEW COMPARISON:  Chest x-ray October 29, 2020. FINDINGS: Tracheostomy tube  with tip approximately 4.9 cm above the carina. Clips at the thoracic inlet. Low lung volumes. Mild left basilar opacity. No visible pleural effusions or pneumothorax. Cardiomediastinal silhouette is accentuated by AP technique. IMPRESSION: Low lung volumes with mild left basilar opacity, which could represent atelectasis, aspiration, and/or pneumonia. Electronically Signed   By: Margaretha Sheffield M.D.   On: 01/13/2022 13:09       Latest Ref Rng & Units 01/21/2022    4:23 AM 01/19/2022    4:07 AM 01/18/2022    3:39 AM  CBC  WBC 4.0 - 10.5 K/uL 4.2   4.9   5.4    Hemoglobin 13.0 - 17.0 g/dL 9.3   8.9   9.2    Hematocrit 39.0 - 52.0 % 29.1   27.6   29.4    Platelets 150 - 400 K/uL 201   195   199        Latest Ref Rng & Units 01/21/2022    8:33 PM 01/20/2022    4:01 AM 01/19/2022    4:07 AM  BMP  Glucose 70 - 99 mg/dL 77   101   104    BUN 6 - 20 mg/dL '23   21   21    '$ Creatinine 0.61 - 1.24 mg/dL 0.88   0.96   1.09    Sodium 135 - 145 mmol/L 141   140   141    Potassium 3.5 - 5.1 mmol/L 3.6   4.2   4.2    Chloride 98 - 111 mmol/L 98   100   102    CO2 22 - 32 mmol/L 35   29   31    Calcium 8.9 - 10.3 mg/dL 8.3   8.4   8.3     Lab Results  Component Value Date   HEPBSAB Reactive (A) 01/13/2022   Lab Results  Component Value Date   HCVAB Reactive (A) 01/13/2022   Lab Results  Component Value Date   HEPBSAG NON REACTIVE 01/13/2022   HCV Fibrosure Lab Results  Component Value Date   FIBROSTAGE  No fibrosis 01/20/2022    Discharge Instructions: Discharge Instructions     Ambulatory referral to Physical Therapy   Complete by: As directed        You will need to continue to have your meats chopped in small bites. We have attached a document detailing how your food should be prepared.   Patient will need follow up with the providers below in 1-3 weeks:   Sadie Haber, [place]   [place], [place]   [facility]   [place], [place]  [address]   [phone] (Work)    Coralie Keens. Amada Jupiter, MD It is important patient see this provider as soon as possible to evaluate his feeling of throat swelling.  [facility] OTOLARYNGOLOGY [facility] [place]   [address]   [place], [place] [address]   [facility]: For treatment of his Hepatitis C  Ground Floor - [facility], [place], [place], [place] [address] [phone]  Pt will also need suctioning and a plug for his prosthesis at his jail.  Signed: Orvis Brill, MD 01/22/2022, 12:41 PM   Pager: [phone]

## 2022-01-22 NOTE — Progress Notes (Signed)
Speech Language Pathology Treatment: Cognitive-Linquistic  Patient Details Name: Gregory Snyder MRN: [id number] DOB: October 21, 1975 Today's Date: 01/22/2022 Time: 1500-1520 SLP Time Calculation (min) (ACUTE ONLY): 20 min  Assessment / Plan / Recommendation Clinical Impression  Pt was seen for ongoing care related to his established laryngectomy. MD and TOC aware that his plug has not yet arrived for his prosthesis. Per police officer, transportation of it can be arranged once it arrives. SLP reviewed information with pt and officer present about supplies, educational items, and letter of need being sent back with pt to facilitate access to what he will need for communication and care of his airway on an ongoing basis. Pt/jail also provided with contact information for [employer] for supplies and primary SLP at [facility], with whom follow up should be scheduled as soon as can be coordinated in order to get his prosthesis exchanged. Pt is starting to use his electrolarynx a little bit, but says that the swelling is still making writing more functional at this time. Pt has his electrolarynx and tablet to discharge with him. He should be able to gradually try to increase his diet as his swelling goes down, but should seek further evaluation if he continues to have trouble after the edema resolves. SLP can be available for any ongoing needs pertaining to discharge planning.    HPI HPI: Pt is a 46 yo male presenting with pain and swelling in his floor of mouth and neck. CT Neck concerning for soft tissue infection and also showed extensive post-surgical changes (from May 2022), including partial pharyngectomy, total laryngectomy, thyroidectomy, base of tongue resection, and nodal dissections. Most recent SLP notes at [facility] are from August 2022, at which time pt had a larytube (10/55) and was using "home" HMEs. He was starting to use TEP (17 [place], 10 mm Provox Vega +1 shim). On 12/28/21, pt lost his larytube and upon arrival to the  ER, was given an #8 [place].      SLP Plan  Continue with current plan of care      Recommendations for follow up therapy are one component of a multi-disciplinary discharge planning process, led by the attending physician.  Recommendations may be updated based on patient status, additional functional criteria and insurance authorization.    Recommendations  Diet recommendations: Dysphagia 2 (fine chop);Thin liquid Liquids provided via: Cup;Straw Medication Administration: Whole meds with liquid Supervision: Patient able to self feed Compensations: Small sips/bites;Follow solids with liquid Postural Changes and/or Swallow Maneuvers: Seated upright 90 degrees                Oral Care Recommendations: Oral care BID Follow Up Recommendations: Outpatient SLP Assistance recommended at discharge: PRN SLP Visit Diagnosis: Aphonia (R49.1) Plan: Continue with current plan of care           Osie Bond., M.A. [place] Office [phone]  Secure chat preferred   01/22/2022, 4:33 PM

## 2022-01-22 NOTE — TOC Transition Note (Signed)
Transition of Care [facility]) - CM/SW Discharge Note   Patient Details  Name: Gregory Snyder MRN: [id number] Date of Birth: 06/16/1976  [facility] CM/SW Contact:  Tom-Johnson, Renea Ee, RN Phone Number: 01/22/2022, 3:41 PM   Clinical Narrative:     Patient is scheduled for discharge today. MD to write prescription and [facility] staff [facility] will fill out prescriptions per Ms. Jimmye Norman, Market researcher. Also, Followup appointment needs should be included in discharge summary and the facility will call and schedule for safety reasons. They do not want patient of know the dates of appointments.  Facility to provide suction machine and canisters. Laryngeal supplies given to officer to take with patient at discharge.  Patient will be transported by [facility]. No further TOC needs noted.  Final next level of care: [facility] (OP PT) Barriers to Discharge: Barriers Resolved   Patient Goals and CMS Choice Patient states their goals for this hospitalization and ongoing recovery are:: To feturn to [place] CMS Medicare.gov Compare Post Acute Care list provided to:: Patient Choice offered to / list presented to : Patient  Discharge Placement                Patient to be transferred to facility by: [facility] transportation      Discharge Plan and Services                DME Arranged: N/A DME Agency: NA       HH Arranged: NA HH Agency: NA (Outpatient PT)        Social Determinants of Health (SDOH) Interventions     Readmission Risk Interventions     View : No data to display.

## 2022-01-22 NOTE — Discharge Instructions (Addendum)
You will need to continue to have your meats chopped in small bites. We have attached a document detailing how your food should be prepared.   Diet recommendations: Dysphagia 3 (mechanical soft);Thin liquid (finely chop meats and add extra gravy) Liquids provided via: Cup;Straw Medication Administration: Whole meds with liquid Supervision: Patient able to self feed Compensations: Small sips/bites;Follow solids with liquid Postural Changes and/or Swallow Maneuvers: Seated upright 90 degrees          Patient will need follow up with the providers below in 1-3 weeks:   Sadie Haber, [place], [facility]   [place], [place] [address]   [phone] (Work)    Patient will also need a plug for his prosthesis . This was ordered the day before discharge and patient was unable to secure this.   Coralie Keens. Amada Jupiter, MD It is important patient see this provider as soon as possible to evaluate his feeling of throat swelling.  [facility] OTOLARYNGOLOGY [facility] [place]   [address]   [place], [place] [address]   [place] [facility]: For treatment of his hepatitis C  Ground Floor - [facility], [place], [place], [place] [address] [phone]

## 2022-01-22 NOTE — Progress Notes (Signed)
  Mobility Specialist Criteria Algorithm Info.   01/22/22 1020  Mobility  Activity Ambulated with assistance in hallway;Dangled on edge of bed  Range of Motion/Exercises Active;All extremities  Level of Assistance Standby assist, set-up cues, supervision of patient - no hands on  Assistive Device Front wheel walker  Distance Ambulated (ft) 280 ft  Activity Response Tolerated well   Patient received in supine agreeable to participate in mobility. Ambulated in hallway with min guard with steady gait. Returned to room without complaint or incident. Was left dangling EOB with all needs met, call bell in reach.   01/22/2022 12:31 PM  Gregory Snyder, [place], [place]  MHWKG:[phone] Office: [phone]

## 2022-01-23 LAB — COMPREHENSIVE METABOLIC PANEL
ALT: 61 U/L — ABNORMAL HIGH (ref 0–44)
AST: 70 U/L — ABNORMAL HIGH (ref 15–41)
Albumin: 3.9 g/dL (ref 3.5–5.0)
Alkaline Phosphatase: 43 U/L (ref 38–126)
Anion gap: 9 (ref 5–15)
BUN: 29 mg/dL — ABNORMAL HIGH (ref 6–20)
CO2: 33 mmol/L — ABNORMAL HIGH (ref 22–32)
Calcium: 8.6 mg/dL — ABNORMAL LOW (ref 8.9–10.3)
Chloride: 100 mmol/L (ref 98–111)
Creatinine, Ser: 0.97 mg/dL (ref 0.61–1.24)
GFR, Estimated: >60 mL/min (ref >60)
Glucose, Bld: 68 mg/dL — ABNORMAL LOW (ref 70–99)
Potassium: 3.9 mmol/L (ref 3.5–5.1)
Sodium: 142 mmol/L (ref 135–145)
Total Bilirubin: 0.5 mg/dL (ref 0.3–1.2)
Total Protein: 7.6 g/dL (ref 6.5–8.1)

## 2022-01-23 LAB — CBG MONITORING, ED: Glucose-Capillary: 97 mg/dL (ref 70–99)

## 2022-01-23 NOTE — Discharge Instructions (Signed)
You were evaluated in the Emergency Department and after careful evaluation, we did not find any emergent condition requiring admission or further testing in the hospital.  Your exam/testing today was overall reassuring.  Please return to the Emergency Department if you experience any worsening of your condition.  Thank you for allowing us to be a part of your care.  

## 2022-01-23 NOTE — ED Provider Notes (Signed)
  Provider Note MRN:  [id number]  Arrival date & time: 01/23/22    ED Course and Medical Decision Making  Assumed care from Dr. Tomi Bamberger at shift change.  Tracheostomy patient recently discharged, sent back because jail did not have suction equipment.  Patient is not having respiratory issues, does not emergently need suction, awaiting labs, anticipating discharge.  Procedures  Final Clinical Impressions(s) / ED Diagnoses     ICD-10-CM   1. Paresthesia  R20.2     2. Hx of tracheostomy  Z98.890       ED Discharge Orders     None         Discharge Instructions      You were evaluated in the Emergency Department and after careful evaluation, we did not find any emergent condition requiring admission or further testing in the hospital.  Your exam/testing today was overall reassuring.  Please return to the Emergency Department if you experience any worsening of your condition.  Thank you for allowing [place] to be a part of your care.       Barth Kirks. [place]onia Small, [place] [email or web address]    Maudie Flakes, MD 01/23/22 [phone]

## 2022-01-26 ENCOUNTER — Emergency Department (HOSPITAL_COMMUNITY)

## 2022-01-26 ENCOUNTER — Other Ambulatory Visit: Payer: Self-pay

## 2022-01-26 ENCOUNTER — Emergency Department (HOSPITAL_COMMUNITY)
Admission: EM | Admit: 2022-01-26 | Discharge: 2022-01-26 | Disposition: A | Discharge status: Home / Self Care | Attending: Emergency Medicine | Admitting: Emergency Medicine

## 2022-01-26 DIAGNOSIS — Z8521 Personal history of malignant neoplasm of larynx: Secondary | ICD-10-CM | POA: Insufficient documentation

## 2022-01-26 DIAGNOSIS — R221 Localized swelling, mass and lump, neck: Secondary | ICD-10-CM | POA: Diagnosis not present

## 2022-01-26 DIAGNOSIS — E039 Hypothyroidism, unspecified: Secondary | ICD-10-CM | POA: Insufficient documentation

## 2022-01-26 DIAGNOSIS — G6289 Other specified polyneuropathies: Secondary | ICD-10-CM | POA: Diagnosis not present

## 2022-01-26 DIAGNOSIS — Z79899 Other long term (current) drug therapy: Secondary | ICD-10-CM | POA: Insufficient documentation

## 2022-01-26 DIAGNOSIS — Z430 Encounter for attention to tracheostomy: Secondary | ICD-10-CM | POA: Insufficient documentation

## 2022-01-26 DIAGNOSIS — R252 Cramp and spasm: Secondary | ICD-10-CM | POA: Diagnosis present

## 2022-01-26 LAB — BASIC METABOLIC PANEL
Anion gap: 7 (ref 5–15)
BUN: 17 mg/dL (ref 6–20)
CO2: 29 mmol/L (ref 22–32)
Calcium: 7.2 mg/dL — ABNORMAL LOW (ref 8.9–10.3)
Chloride: 101 mmol/L (ref 98–111)
Creatinine, Ser: 0.96 mg/dL (ref 0.61–1.24)
GFR, Estimated: >60 mL/min (ref >60)
Glucose, Bld: 82 mg/dL (ref 70–99)
Potassium: 4 mmol/L (ref 3.5–5.1)
Sodium: 137 mmol/L (ref 135–145)

## 2022-01-26 LAB — CBC
HCT: 25.6 % — ABNORMAL LOW (ref 39.0–52.0)
Hemoglobin: 8.4 g/dL — ABNORMAL LOW (ref 13.0–17.0)
MCH: 32.8 pg (ref 26.0–34.0)
MCHC: 32.8 g/dL (ref 30.0–36.0)
MCV: 100 fL (ref 80.0–100.0)
Platelets: 179 10*3/uL (ref 150–400)
RBC: 2.56 MIL/uL — ABNORMAL LOW (ref 4.22–5.81)
RDW: 15.5 % (ref 11.5–15.5)
WBC: 2.9 10*3/uL — ABNORMAL LOW (ref 4.0–10.5)
nRBC: 0 % (ref 0.0–0.2)

## 2022-01-26 MED ORDER — DOXYCYCLINE HYCLATE 100 MG PO CAPS: 100.0000 mg | ORAL_CAPSULE | Freq: Two times a day (BID) | ORAL | 0 refills | Status: DC

## 2022-01-26 MED ORDER — GABAPENTIN 100 MG PO CAPS: 100.0000 mg | ORAL_CAPSULE | Freq: Three times a day (TID) | ORAL | 1 refills | Status: DC

## 2022-01-26 MED ORDER — IOHEXOL 350 MG/ML SOLN
100.0000 mL | Freq: Once | INTRAVENOUS | Status: AC | PRN
  Administered 2022-01-26: 100 mL via INTRAVENOUS

## 2022-01-26 MED ORDER — CYCLOBENZAPRINE HCL 10 MG PO TABS
5.0000 mg | ORAL_TABLET | Freq: Once | ORAL | Status: AC
Start: 2022-01-26 — End: 2022-01-26
  Administered 2022-01-26: 5 mg via ORAL
  Filled 2022-01-26: qty 1

## 2022-01-26 MED ORDER — GABAPENTIN 300 MG PO CAPS: 300.0000 mg | ORAL_CAPSULE | Freq: Three times a day (TID) | ORAL | 1 refills | Status: DC

## 2022-01-26 NOTE — ED Notes (Signed)
Awaiting dc--pulm RN bring pt supplies at this time.

## 2022-01-26 NOTE — ED Provider Notes (Signed)
[name] Arrival date & time: 01/26/22  0941     History  Chief Complaint  Patient presents with   Dysphagia   Leg Pain    Gregory Snyder is a 46 y.o. male with medical history significant for Ludwick's angina, thyroidectomy, throat cancer, peripheral neuropathy.  Patient presents ED for evaluation of dysphagia, tracheostomy issue.  Patient also complaining of peripheral neuropathy.  Patient reports that beginning on 12/28/2021 he began having cramping in his hands and feet.  Patient was seen in this ED on 5/17 and admitted due to aspiration pneumonitis until 5/26.  Patient did not report this is an issue at this time.  Patient was subsequently seen on 5/26 for paresthesias, that he was discharged, also for not [facility] equipment [facility] jail for suctioning and had an unremarkable work-up done at this time.  Previous patient care records reviewed, patient was receiving care at [facility].  At [facility], patient was treated for Ludwick's angina, peripheral neuropathy, hypothyroidism, hypocalcemia.  Patient denies any fevers, nausea, vomiting, chest pain, shortness of breath.  Patient endorses trouble swallowing.   Leg Pain     Home Medications Prior to Admission medications   Medication Sig Start Date End Date Taking? Authorizing Provider  doxycycline (VIBRAMYCIN) 100 MG capsule Take 1 capsule (100 mg total) by mouth 2 (two) times daily. 01/26/22  Yes Azucena Cecil, PA-C  gabapentin (NEURONTIN) 100 MG capsule Take 1 capsule (100 mg total) by mouth 3 (three) times daily. 01/26/22  Yes Azucena Cecil, PA-C  amitriptyline (ELAVIL) 10 MG tablet Take 1 tablet (10 mg total) by mouth at bedtime. Patient not taking: Reported on 01/22/2022 01/22/22   Rick Duff, MD  levothyroxine (SYNTHROID) 137 MCG tablet Take 1 tablet (137 mcg total) by mouth daily before breakfast. Patient not taking:  Reported on 01/22/2022 01/22/22   Rick Duff, MD  vitamin B-12 (CYANOCOBALAMIN) 1000 MCG tablet Take 1 tablet (1,000 mcg total) by mouth daily. Patient not taking: Reported on 01/22/2022 01/22/22 04/22/22  Rick Duff, MD  Vitamin D, Ergocalciferol, (DRISDOL) 1.25 MG (50000 UNIT) CAPS capsule Take 1 capsule (50,000 Units total) by mouth every 7 (seven) days. Patient not taking: Reported on 01/22/2022 01/22/22   Rick Duff, MD      Allergies    Motrin [ibuprofen]    Review of Systems   Review of Systems  Physical Exam Updated Vital Signs BP 98/65   Pulse 70   Temp (!) 97.5 F (36.4 C) (Oral)   Resp 14   Ht '5\' 9"'$  (1.753 m)   Wt 81.6 kg   SpO2 100%   BMI 26.58 kg/m  Physical Exam Vitals and nursing note reviewed.  Constitutional:      General: He is not in acute distress.    Appearance: Normal appearance. He is not ill-appearing, toxic-appearing or diaphoretic.  HENT:     Head: Normocephalic and atraumatic.      Right Ear: Tenderness present. A middle ear effusion is present. There is no impacted cerumen.     Nose: Nose normal. No congestion.     Mouth/Throat:     Mouth: Mucous membranes are moist.     Pharynx: No oropharyngeal exudate or posterior oropharyngeal erythema.  Eyes:     General:        Right eye: No discharge.        Left eye: No discharge.     Extraocular Movements: Extraocular  movements intact.     Pupils: Pupils are equal, round, and reactive to light.  Neck:     Comments: Tracheostomy noted, no respiratory difficulty, patient handling secretions appropriately Cardiovascular:     Rate and Rhythm: Normal rate and regular rhythm.  Pulmonary:     Effort: Pulmonary effort is normal. No respiratory distress.     Breath sounds: Normal breath sounds. No stridor. No wheezing, rhonchi or rales.  Abdominal:     General: Abdomen is flat. Bowel sounds are normal.     Palpations: Abdomen is soft.     Tenderness: There is no abdominal tenderness.   Musculoskeletal:     Cervical back: Normal range of motion and neck supple. No tenderness.  Skin:    General: Skin is warm and dry.     Capillary Refill: Capillary refill takes less than 2 seconds.  Neurological:     General: No focal deficit present.     Mental Status: He is alert and oriented to person, place, and time.     GCS: GCS eye subscore is 4. GCS verbal subscore is 5. GCS motor subscore is 6.     Cranial Nerves: Cranial nerves 2-12 are intact. No cranial nerve deficit.     Motor: Motor function is intact. No weakness.     Comments: Patient complaining of tingling, burning to bilateral upper and lower extremities    ED Results / Procedures / Treatments   Labs (all labs ordered are listed, but only abnormal results are displayed) Labs Reviewed  CBC - Abnormal; Notable for the following components:      Result Value   WBC 2.9 (*)    RBC 2.56 (*)    Hemoglobin 8.4 (*)    HCT 25.6 (*)    All other components within normal limits  BASIC METABOLIC PANEL - Abnormal; Notable for the following components:   Calcium 7.2 (*)    All other components within normal limits    EKG None  Radiology CT Soft Tissue Neck W Contrast  Result Date: 01/26/2022 CLINICAL DATA:  Soft tissue swelling, infection suspected, neck xray done patient with trach and soft tissue swelling of neck EXAM: CT NECK WITH CONTRAST TECHNIQUE: Multidetector CT imaging of the neck was performed using the standard protocol following the bolus administration of intravenous contrast. RADIATION DOSE REDUCTION: This exam was performed according to the departmental dose-optimization program which includes automated exposure control, adjustment of the mA and/or kV according to patient size and/or use of iterative reconstruction technique. CONTRAST:  119m OMNIPAQUE IOHEXOL 350 MG/ML SOLN COMPARISON:  CT neck Jan 13, 2022. FINDINGS: Redemonstrated extensive postsurgical changes, including partial pharyngectomy, total  laryngectomy, thyroidectomy, and base of tongue resection. Large flap within the region of resection. Similar subcutaneous edema in the neck, small volume prevertebral edema, and areas of soft tissue thickening along the left eccentric tongue and oropharynx. No discrete, drainable fluid collection. Bilateral nodal dissections in the neck without evidence of pathologically enlarged lymph nodes Resection of the right submandibular gland. Left submandibular gland and parotid glands are unremarkable. Tracheostomy tube is partially imaged in the lower trachea. Lies lung apices are clear. Temporal bones are further assessed on concurrent CT of the temporal bones. No evidence of obvious acute abnormality in the visualized brain. Unremarkable orbits. IMPRESSION: In comparison to CT of the neck from Jan 13, 2022, no substantial change in extensive postsurgical change with subcutaneous edema in the neck, small volume prevertebral edema, and areas of soft tissue thickening along the  left eccentric tongue and oropharynx. Findings could represent post treatment change, but superimposed infection is not excluded. No discrete, drainable fluid collection. No clearly masslike or nodular soft tissue to suggest recurrence, but recommend continued imaging follow-up and mucosal surveillance to assess for malignancy given no immediate postoperative imaging for comparison. Electronically Signed   By: Margaretha Sheffield M.D.   On: 01/26/2022 12:48   CT Temporal Bones W Contrast  Result Date: 01/26/2022 CLINICAL DATA:  Mastoiditis deep space abscess or infection of neck, ludgwin angina, patient has redness to mastoid area and pain EXAM: CT TEMPORAL BONES WITH CONTRAST TECHNIQUE: Axial and coronal plane CT imaging of the petrous temporal bones was performed with thin-collimation image reconstruction following intravenous contrast administration. Multiplanar CT image reconstructions were also generated. RADIATION DOSE REDUCTION: This exam  was performed according to the departmental dose-optimization program which includes automated exposure control, adjustment of the mA and/or kV according to patient size and/or use of iterative reconstruction technique. CONTRAST:  156m OMNIPAQUE IOHEXOL 350 MG/ML SOLN COMPARISON:  None Available. FINDINGS: RIGHT TEMPORAL BONE External auditory canal: Normal. Middle ear cavity: Normally aerated. The scutum and ossicles are normal. The tegmen tympani is intact. Inner ear structures: The cochlea, vestibule and semicircular canals are normal. The vestibular aqueduct is not enlarged. Internal auditory and facial nerve canals:  Normal Mastoid air cells: Normally aerated. No osseous erosion. LEFT TEMPORAL BONE External auditory canal: Normal. Middle ear cavity: Normally aerated. The scutum and ossicles are normal. The tegmen tympani is intact. Inner ear structures: The cochlea, vestibule and semicircular canals are normal. The vestibular aqueduct is not enlarged. Internal auditory and facial nerve canals:  Normal. Mastoid air cells: Normally aerated. No osseous erosion. Vascular: Normal non-contrast appearance of the carotid canals, jugular bulbs and sigmoid plates. Limited intracranial:  No acute or significant finding. Visible orbits/paranasal sinuses: Mild paranasal sinus mucosal thickening. Soft tissues: Diffuse subcutaneous edema with ill-defined soft tissue thickening inferior and posterior to the right temporal bone in the upper neck. No discrete, drainable fluid collection IMPRESSION: 1. Diffuse subcutaneous edema with ill-defined soft tissue thickening inferior and posterior to the right temporal bone in the upper neck. No discrete, drainable fluid collection. Findings could be secondary to post treatment change and/or infection. Malignancy is thought less likely, but findings warrant attention on follow-up imaging given no immediate post treatment CT for comparison. 2. Unremarkable appearance of the temporal  bones. No mastoid or middle ear effusions. Electronically Signed   By: FMargaretha SheffieldM.D.   On: 01/26/2022 12:58    Procedures Procedures    Medications Ordered in ED Medications  cyclobenzaprine (FLEXERIL) tablet 5 mg (5 mg Oral Given 01/26/22 1217)  iohexol (OMNIPAQUE) 350 MG/ML injection 100 mL (100 mLs Intravenous Contrast Given 01/26/22 1228)    ED Course/ Medical Decision Making/ A&P                           Medical Decision Making Amount and/or Complexity of Data Reviewed Labs: ordered. Radiology: ordered.  Risk Prescription drug management.   46year old male presents to the ED for evaluation.  Please see HPI for further details.  On examination, the patient's tracheostomy site does not appear to be infected.  There is no surrounding erythema, drainage.  The patient is handling his secretions appropriately.  He is not drooling.  Patient has no obvious soft tissue swelling of the neck that I am able to appreciate on visual inspection or palpation.  Patient worked up utilizing the following labs and imaging studies interpreted by me personally: - CBC shows decreased white blood cell count of 2.9 however this is consistent with a patient baseline.  Patient hemoglobin also decreased to 8.4 however again this is in line with the patient's baseline - BMP shows decreased calcium of 7.2, patient is status post thyroidectomy.  Patient states that he was receiving Ensure shakes 4 times a day while in hospital.  I have written a note to the medical staff at jail stating that this patient needs access to calcium supplementation. - CT temporal bone scan shows no significant mastoid or middle ear effusion.  Temporal bones unremarkable - CT soft tissues neck shows no appreciable fluid collection, there is minimal subcutaneous edema.  Patient provided with 5 mg Flexeril.  Patient will be discharged home with 10-day course of doxycycline as well as prescription for gabapentin for his  peripheral neuropathy.  Patient reports that he is unable to access his gabapentin while incarcerated because it is believed to be a narcotic.  I have written a note stating that this patient requires gabapentin.  I have also written it requiring that this patient receives calcium supplementation.  At this time, the patient is stable for discharge.  The patient was given return precautions and he voiced understanding.  The patient had all of his questions answered to satisfaction.  The duration of this interaction was done through the patient utilizing a white board that he wrote on.  Patient stable for discharge at this time.   Final Clinical Impression(s) / ED Diagnoses Final diagnoses:  Neck swelling  Tracheostomy care [facility])  Other polyneuropathy    Rx / DC Orders ED Discharge Orders          Ordered    gabapentin (NEURONTIN) 300 MG capsule  3 times daily,   Status:  Discontinued        01/26/22 1414    doxycycline (VIBRAMYCIN) 100 MG capsule  2 times daily,   Status:  Discontinued        01/26/22 1414    gabapentin (NEURONTIN) 100 MG capsule  3 times daily,   Status:  Discontinued        01/26/22 1419    doxycycline (VIBRAMYCIN) 100 MG capsule  2 times daily        01/26/22 1421    gabapentin (NEURONTIN) 100 MG capsule  3 times daily        01/26/22 1432              Azucena Cecil, PA-C 01/26/22 1434    Wyvonnia Dusky, MD 01/26/22 [phone]

## 2022-01-26 NOTE — ED Notes (Signed)
ED Provider at bedside. 

## 2022-01-26 NOTE — ED Notes (Signed)
Back from CT

## 2022-01-26 NOTE — Discharge Instructions (Addendum)
Return to the ED with any new symptoms such as fevers, inability to swallow Please follow-up with your ENT doctor as discussed Please begin taking doxycycline.  You will take this twice daily for the next 10 days. I have written you prescription for gabapentin.  Gabapentin is not a narcotic prescription medication.  Your peripheral neuropathy necessitates that you take gabapentin. Please read attached informational guides concerning how to clean the tracheostomy tube    To the medical staff at [facility], this patient has a long standing and well documented history of peripheral neuropathy. This patient requires gabapentin for his neuropathic pain. Gabapentin is not a narcotic medication. This patient also requires proper suctioning equipment in order to properly clear his tracheostomy and to prevent him from having to remove the trach which increases chances of infection. This patient also needs to be on 10 days of doxycycline BID. This patient also requires increased calcium supplementation due to his thyroidectomy. The patient will need ensure shakes 4x/day as well as access to milk and calcium supplements.

## 2022-01-26 NOTE — ED Notes (Signed)
Pt able to eat and drink without difficulty

## 2022-01-26 NOTE — Progress Notes (Signed)
Patient in ED room 34C at the time of this consult. Delivered Voice Prosthesis plug to patient to take with him to jail. This is in case his VP leaks. Patient affirms that he is familiar with this equipment and knows how to use it. He is being discharged today from the ED. I will contact the clinic in the jail to inform them that the patient will need to come to the hospital with all his laryngectomy and VP supplies in the future.

## 2022-01-26 NOTE — ED Notes (Signed)
Patient transported to CT 

## 2022-01-26 NOTE — ED Triage Notes (Signed)
Pt arrived via POV with [facility] from [facility] with c/c of Throat, leg and feet pain. Per pt pain has been since 1 month.Pt states he has to take his trach out to eat which is what caused his throat to hurt more. Pt doesn't recall doing anything to legs and feet and states pain has been just getting worse.

## 2022-02-10 ENCOUNTER — Encounter: Payer: PRIVATE HEALTH INSURANCE | Admitting: Internal Medicine

## 2022-10-11 ENCOUNTER — Emergency Department (HOSPITAL_COMMUNITY): Payer: Medicaid Other

## 2022-10-11 ENCOUNTER — Other Ambulatory Visit: Payer: Self-pay

## 2022-10-11 ENCOUNTER — Encounter (HOSPITAL_COMMUNITY): Payer: Self-pay

## 2022-10-11 ENCOUNTER — Inpatient Hospital Stay (HOSPITAL_COMMUNITY)
Admission: EM | Admit: 2022-10-11 | Discharge: 2022-10-22 | DRG: 178 | Disposition: A | Discharge status: Home / Self Care | Payer: Medicaid Other | Attending: Internal Medicine | Admitting: Internal Medicine

## 2022-10-11 ENCOUNTER — Inpatient Hospital Stay (HOSPITAL_COMMUNITY): Payer: Medicaid Other

## 2022-10-11 DIAGNOSIS — E871 Hypo-osmolality and hyponatremia: Secondary | ICD-10-CM | POA: Diagnosis present

## 2022-10-11 DIAGNOSIS — Z9002 Acquired absence of larynx: Secondary | ICD-10-CM | POA: Diagnosis not present

## 2022-10-11 DIAGNOSIS — L03221 Cellulitis of neck: Secondary | ICD-10-CM | POA: Diagnosis present

## 2022-10-11 DIAGNOSIS — B182 Chronic viral hepatitis C: Secondary | ICD-10-CM | POA: Diagnosis present

## 2022-10-11 DIAGNOSIS — G8929 Other chronic pain: Secondary | ICD-10-CM | POA: Diagnosis present

## 2022-10-11 DIAGNOSIS — Z5900 Homelessness unspecified: Secondary | ICD-10-CM | POA: Diagnosis not present

## 2022-10-11 DIAGNOSIS — E039 Hypothyroidism, unspecified: Secondary | ICD-10-CM | POA: Diagnosis present

## 2022-10-11 DIAGNOSIS — Z79899 Other long term (current) drug therapy: Secondary | ICD-10-CM

## 2022-10-11 DIAGNOSIS — Z85819 Personal history of malignant neoplasm of unspecified site of lip, oral cavity, and pharynx: Secondary | ICD-10-CM | POA: Diagnosis not present

## 2022-10-11 DIAGNOSIS — J690 Pneumonitis due to inhalation of food and vomit: Secondary | ICD-10-CM | POA: Diagnosis present

## 2022-10-11 DIAGNOSIS — J0410 Acute tracheitis without obstruction: Secondary | ICD-10-CM

## 2022-10-11 DIAGNOSIS — D61818 Other pancytopenia: Secondary | ICD-10-CM | POA: Diagnosis present

## 2022-10-11 DIAGNOSIS — M549 Dorsalgia, unspecified: Secondary | ICD-10-CM | POA: Diagnosis present

## 2022-10-11 DIAGNOSIS — D509 Iron deficiency anemia, unspecified: Secondary | ICD-10-CM | POA: Diagnosis present

## 2022-10-11 DIAGNOSIS — M25511 Pain in right shoulder: Secondary | ICD-10-CM | POA: Diagnosis present

## 2022-10-11 DIAGNOSIS — D649 Anemia, unspecified: Secondary | ICD-10-CM | POA: Insufficient documentation

## 2022-10-11 DIAGNOSIS — R2231 Localized swelling, mass and lump, right upper limb: Secondary | ICD-10-CM | POA: Diagnosis present

## 2022-10-11 DIAGNOSIS — Z87891 Personal history of nicotine dependence: Secondary | ICD-10-CM

## 2022-10-11 DIAGNOSIS — Z1152 Encounter for screening for COVID-19: Secondary | ICD-10-CM | POA: Diagnosis not present

## 2022-10-11 DIAGNOSIS — B1920 Unspecified viral hepatitis C without hepatic coma: Secondary | ICD-10-CM

## 2022-10-11 DIAGNOSIS — Z430 Encounter for attention to tracheostomy: Secondary | ICD-10-CM

## 2022-10-11 DIAGNOSIS — E890 Postprocedural hypothyroidism: Secondary | ICD-10-CM | POA: Diagnosis present

## 2022-10-11 DIAGNOSIS — Z79890 Hormone replacement therapy (postmenopausal): Secondary | ICD-10-CM | POA: Diagnosis not present

## 2022-10-11 HISTORY — DX: Malignant (primary) neoplasm, unspecified: C80.1

## 2022-10-11 LAB — CBC WITH DIFFERENTIAL/PLATELET
Abs Immature Granulocytes: 0.03 10*3/uL (ref 0.00–0.07)
Basophils Absolute: 0 10*3/uL (ref 0.0–0.1)
Basophils Relative: 0 %
Eosinophils Absolute: 0 10*3/uL (ref 0.0–0.5)
Eosinophils Relative: 1 %
HCT: 28.2 % — ABNORMAL LOW (ref 39.0–52.0)
Hemoglobin: 9.2 g/dL — ABNORMAL LOW (ref 13.0–17.0)
Immature Granulocytes: 1 %
Lymphocytes Relative: 5 %
Lymphs Abs: 0.3 10*3/uL — ABNORMAL LOW (ref 0.7–4.0)
MCH: 31.1 pg (ref 26.0–34.0)
MCHC: 32.6 g/dL (ref 30.0–36.0)
MCV: 95.3 fL (ref 80.0–100.0)
Monocytes Absolute: 0.4 10*3/uL (ref 0.1–1.0)
Monocytes Relative: 7 %
Neutro Abs: 4.7 10*3/uL (ref 1.7–7.7)
Neutrophils Relative %: 86 %
Platelets: 169 10*3/uL (ref 150–400)
RBC: 2.96 MIL/uL — ABNORMAL LOW (ref 4.22–5.81)
RDW: 12.9 % (ref 11.5–15.5)
WBC: 5.4 10*3/uL (ref 4.0–10.5)
nRBC: 0 % (ref 0.0–0.2)

## 2022-10-11 LAB — RESP PANEL BY RT-PCR (RSV, FLU A&B, COVID)  RVPGX2
Influenza A by PCR: NEGATIVE
Influenza B by PCR: NEGATIVE
Resp Syncytial Virus by PCR: NEGATIVE
SARS Coronavirus 2 by RT PCR: NEGATIVE

## 2022-10-11 LAB — I-STAT CHEM 8, ED
BUN: 22 mg/dL — ABNORMAL HIGH (ref 6–20)
Calcium, Ion: 0.92 mmol/L — ABNORMAL LOW (ref 1.15–1.40)
Chloride: 90 mmol/L — ABNORMAL LOW (ref 98–111)
Creatinine, Ser: 1.1 mg/dL (ref 0.61–1.24)
Glucose, Bld: 111 mg/dL — ABNORMAL HIGH (ref 70–99)
HCT: 28 % — ABNORMAL LOW (ref 39.0–52.0)
Hemoglobin: 9.5 g/dL — ABNORMAL LOW (ref 13.0–17.0)
Potassium: 3.8 mmol/L (ref 3.5–5.1)
Sodium: 134 mmol/L — ABNORMAL LOW (ref 135–145)
TCO2: 32 mmol/L (ref 22–32)

## 2022-10-11 LAB — BASIC METABOLIC PANEL
Anion gap: 10 (ref 5–15)
BUN: 19 mg/dL (ref 6–20)
CO2: 32 mmol/L (ref 22–32)
Calcium: 7.5 mg/dL — ABNORMAL LOW (ref 8.9–10.3)
Chloride: 91 mmol/L — ABNORMAL LOW (ref 98–111)
Creatinine, Ser: 1.09 mg/dL (ref 0.61–1.24)
GFR, Estimated: >60 mL/min (ref >60)
Glucose, Bld: 107 mg/dL — ABNORMAL HIGH (ref 70–99)
Potassium: 3.7 mmol/L (ref 3.5–5.1)
Sodium: 133 mmol/L — ABNORMAL LOW (ref 135–145)

## 2022-10-11 LAB — BRAIN NATRIURETIC PEPTIDE: B Natriuretic Peptide: 22.2 pg/mL (ref 0.0–100.0)

## 2022-10-11 LAB — LACTIC ACID, PLASMA: Lactic Acid, Venous: 0.6 mmol/L (ref 0.5–1.9)

## 2022-10-11 MED ORDER — SODIUM CHLORIDE 0.9 % IV BOLUS
1000.0000 mL | Freq: Once | INTRAVENOUS | Status: AC
  Administered 2022-10-11: 1000 mL via INTRAVENOUS

## 2022-10-11 MED ORDER — PIPERACILLIN-TAZOBACTAM 3.375 G IVPB 30 MIN
3.3750 g | Freq: Once | INTRAVENOUS | Status: AC
  Administered 2022-10-11: 3.375 g via INTRAVENOUS
  Filled 2022-10-11: qty 50

## 2022-10-11 MED ORDER — ENOXAPARIN SODIUM 40 MG/0.4ML IJ SOSY
40.0000 mg | PREFILLED_SYRINGE | INTRAMUSCULAR | Status: DC
  Administered 2022-10-12 – 2022-10-22 (×11): 40 mg via SUBCUTANEOUS
  Filled 2022-10-11 (×11): qty 0.4

## 2022-10-11 MED ORDER — MORPHINE SULFATE (PF) 4 MG/ML IV SOLN
4.0000 mg | Freq: Once | INTRAVENOUS | Status: AC
  Administered 2022-10-11: 4 mg via INTRAVENOUS
  Filled 2022-10-11: qty 1

## 2022-10-11 MED ORDER — ACETAMINOPHEN 325 MG PO TABS
650.0000 mg | ORAL_TABLET | Freq: Once | ORAL | Status: AC
  Administered 2022-10-11: 650 mg via ORAL
  Filled 2022-10-11: qty 2

## 2022-10-11 MED ORDER — IOHEXOL 350 MG/ML SOLN
75.0000 mL | Freq: Once | INTRAVENOUS | Status: AC | PRN
  Administered 2022-10-11: 75 mL via INTRAVENOUS

## 2022-10-11 MED ORDER — ONDANSETRON HCL 4 MG/2ML IJ SOLN
4.0000 mg | Freq: Once | INTRAMUSCULAR | Status: AC
  Administered 2022-10-11: 4 mg via INTRAVENOUS
  Filled 2022-10-11: qty 2

## 2022-10-11 MED ORDER — PIPERACILLIN-TAZOBACTAM 3.375 G IVPB
3.3750 g | Freq: Three times a day (TID) | INTRAVENOUS | Status: DC
  Administered 2022-10-12: 3.375 g via INTRAVENOUS
  Filled 2022-10-11: qty 50

## 2022-10-11 MED ORDER — ACETAMINOPHEN 325 MG PO TABS
650.0000 mg | ORAL_TABLET | Freq: Four times a day (QID) | ORAL | Status: DC | PRN
  Administered 2022-10-12 – 2022-10-18 (×5): 650 mg via ORAL
  Filled 2022-10-11 (×5): qty 2

## 2022-10-11 NOTE — H&P (Addendum)
History and Physical    Patient: Gregory Snyder [id number] DOB: 29-Aug-1976 DOA: 10/11/2022 DOS: the patient was seen and examined on 10/11/2022 PCP: System, Provider Not In  Patient coming from: Home  Chief Complaint:  Chief Complaint  Patient presents with   Shortness of Breath   Emesis   HPI: Gregory Snyder is a 47 y.o. 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   History limited as pt cannot vocalize with his tracheostomy stoma. Pt previous had tracheostomy tube but this was displaced in May of last year.  He says symptoms of increase right sided neck pain and erythema started today. Also has difficulty breathing. Denies trouble with swallowing. He has not followed up with ENT at [facility] for at least a year and has not taken any of his medications.   In the ED, febrile to 100.66F, HR 80 on room air.   WBC of 5.4, hgb of 9.2 around baseline.   Mild hyponatremia of 133. Creatinine of 1.04.   Mucous plug was noted to stoma and removed by EDP.  CT soft tissue neck shows stable post-treatment appearance of neck but had patchy ground glass opacity in left upper lobe favoring infection.   Review of Systems: As mentioned in the history of present illness. All other systems reviewed and are negative. Past Medical History:  Diagnosis Date   Cancer [facility])    Past Surgical History:  Procedure Laterality Date   TOTAL LARYNGECTOMY N/A 12/2020   Social History:  reports that he has quit smoking. His smoking use included cigarettes. He smoked an average of 1 pack per day. He has never used smokeless tobacco. He reports current alcohol use. He reports that he does not use drugs.  Allergies  Allergen Reactions   Motrin [Ibuprofen] Hives    History reviewed. No pertinent family history.  Prior to Admission medications   Medication Sig Start Date  End Date Taking? Authorizing Provider  amitriptyline (ELAVIL) 10 MG tablet Take 1 tablet (10 mg total) by mouth at bedtime. Patient not taking: Reported on 01/22/2022 01/22/22   Rick Duff, MD  doxycycline (VIBRAMYCIN) 100 MG capsule Take 1 capsule (100 mg total) by mouth 2 (two) times daily. 01/26/22   Azucena Cecil, PA-C  gabapentin (NEURONTIN) 100 MG capsule Take 1 capsule (100 mg total) by mouth 3 (three) times daily. 01/26/22   Azucena Cecil, PA-C  levothyroxine (SYNTHROID) 137 MCG tablet Take 1 tablet (137 mcg total) by mouth daily before breakfast. Patient not taking: Reported on 01/22/2022 01/22/22   Rick Duff, MD  Vitamin D, Ergocalciferol, (DRISDOL) 1.25 MG (50000 UNIT) CAPS capsule Take 1 capsule (50,000 Units total) by mouth every 7 (seven) days. Patient not taking: Reported on 01/22/2022 01/22/22   Rick Duff, MD    Physical Exam: Vitals:   10/11/22 1731 10/11/22 1745 10/11/22 1924 10/11/22 2145  BP: 123/86  111/78   Pulse: 79  80 73  Resp: (!) 22  14 10  $ Temp: 99.5 F (37.5 C)  (!) 100.4 F (38 C)   TempSrc:   Oral   SpO2: 99%  97% 100%  Weight:  81.6 kg    Height:  5' 9"$  (1.753 m)     Constitutional: NAD, calm, comfortable, nontoxic appearing middle-age male laying in bed Eyes: lids and conjunctivae normal ENMT: Mucous membranes are moist.  Neck: Right-sided lymphadenopathy with spreading erythema of the  right anterior neck down to chest/midsternal region that is painful to palpation.  Tracheostomy stoma with crusted debris but no secretions.    Respiratory: clear to auscultation bilaterally, no wheezing, no crackles. Normal respiratory effort. No accessory muscle use.  Cardiovascular: Regular rate and rhythm, no murmurs / rubs / gallops. No extremity edema.  Abdomen: no tenderness,  Bowel sounds positive.  Musculoskeletal: no clubbing / cyanosis. No joint deformity upper and lower extremities.  Normal muscle tone.  Skin: Firm nodule to mid  right anterior forearm that is erythematous and painful to palpation.  Circular superficial skin tear at the base of the dorsal side of right thumb. Neurologic: CN 2-12 grossly intact. Strength 5/5 in all 4.  Psychiatric: Normal judgment and insight. Alert and oriented x 3. Normal mood. Data Reviewed:  See HPI  Assessment and Plan: * Aspiration pneumonia ([place]) -stable on room air -continue IV Zosyn -PRN suction of tracheostomy stoma   Nodule of skin of right upper extremity -has painful firm nodule on anterior right forearm concerning for possible abscess. Will obtain soft tissue ultrasound;will need I&D if positive for abscess and to add IV vancomycin.  Hepatitis C -Has hx of untreated hepatitis C going back to 2022 and never followed up with GI. Tells me today he was never told of diagnoses but seems he has been on two separate occasions.  -check HCV RNA quant -needs to establish with primary and GI for treatment  Hypothyroidism -s/p total thyroidectomy  -has stopped taking medication at least 6 months ago due to financial constraints. Appears to be homeless and living with a friend. -check TSH  History of throat cancer -head and neck s/p rdial neck dissection, laryngectomy, total thyroidectomy, tracheostomy. Reports previously following with ENT at [facility] last year but has not been following up since.  Cellulitis, neck -right sided neck cellulitis spreading to stoma and to anterior chest  -CT neck soft tissue did not show any abscess  -continue IV Zosyn      Advance Care Planning: Full  Consults: none  Family Communication: none at bedside  Severity of Illness: The appropriate patient status for this patient is INPATIENT. Inpatient status is judged to be reasonable and necessary in order to provide the required intensity of service to ensure the patient's safety. The patient's presenting symptoms, physical exam findings, and initial radiographic and laboratory data  in the context of their chronic comorbidities is felt to place them at high risk for further clinical deterioration. Furthermore, it is not anticipated that the patient will be medically stable for discharge from the hospital within 2 midnights of admission.   * I certify that at the point of admission it is my clinical judgment that the patient will require inpatient hospital care spanning beyond 2 midnights from the point of admission due to high intensity of service, high risk for further deterioration and high frequency of surveillance required.*  Author: Orene Desanctis, DO 10/11/2022 10:54 PM  For on call review [email or web address].

## 2022-10-11 NOTE — ED Triage Notes (Addendum)
Pt c/o SOB, face pain, chills and vomitingx3d. Pt has a hole in throat, but no tracheostomy tube. Pt is a little tachypneic. Pt's right side of face is erythematous. Pt has right facial droopx3d. Pt states LKW 10/08/22. Pt not sure what time his LKW was or when the sx started on 10/09/22. Pt c/o right shoulder painx3d. Pt has decreased ROM of right shoulder. Pt has abscess approx the size of a nickle on right anterior forearm that is painful. Pt has wound on right thumbx4d. The wound measure 1.09cmx3cm and the bedding is pink.

## 2022-10-11 NOTE — ED Notes (Signed)
Called to bedside for evaluation of stoma secretions. On arrival patient is an s/p total laryngectomy via throat cancer history. Upon inspection of stoma site noted no increase sputum production, but did note dime size dried harden mucous plug at the entrance site of the stoma opening. MD Darl Householder made aware. Gauze dampened in sterile water used to moisten mucous plug, and dried mucous plug was removed by MD Darl Householder. No hemodynamic instability or respiratory compromise noted. Patient is stable at this time.   Kieryn Burtis L. Tamala Julian, BS, RRT-ACCS, RCP

## 2022-10-11 NOTE — ED Provider Triage Note (Signed)
Emergency Medicine Provider Triage Evaluation Note  Gregory Snyder , a 47 y.o. male  was evaluated in triage.  Pt complains of shortness of breath and vomiting.  Also with increased difficulty swallowing.  Unsure of fevers, though endorses chills.  Difficulty speaking at baseline.  Denies chest pain or abdominal pain.  Hx of throat cancer, Ludwig's angina, peripheral neuropathy, total laryngectomy.  Review of Systems  Positive:  Negative: See above  Physical Exam  BP 123/86   Pulse 79   Temp 99.5 F (37.5 C)   Resp (!) 22   Ht 5' 9"$  (1.753 m)   Wt 81.6 kg   SpO2 99%   BMI 26.57 kg/m  Gen:   Awake, no distress   Resp:  Mildly tachypneic, equal chest rise, CTAB MSK:   Moves extremities without difficulty  Other:  Throat without current tracheostomy tube.  Notable erythema and droop to right side of face.  Subjective decreased sensation of V3, right side.  Difficulty swallowing and speaking at baseline.  Medical Decision Making  Medically screening exam initiated at 5:46 PM.  Appropriate orders placed.  Gregory Snyder was informed that the remainder of the evaluation will be completed by another provider, this initial triage assessment does not replace that evaluation, and the importance of remaining in the ED until their evaluation is complete.  Consulted with attending Dr. Darl Householder regarding patient history and presentation.  Plan to proceed with CT head with and without contrast, CT maxillofacial with contrast, and CT neck with contrast.   Gregory Rome, PA-C [id number]

## 2022-10-11 NOTE — Assessment & Plan Note (Addendum)
-  s/p total thyroidectomy  -has stopped taking medication at least 6 months ago due to financial constraints. Appears to be homeless and living with a friend. -TSH elevated at 56.362 on 10/11/2022.  Free T4 undetectable -Patient is receiving levothyroxine 137 mcg daily. -Discussed importance of compliance at discharge. -Needs repeat TSH and T4 in 4 to 6 weeks

## 2022-10-11 NOTE — Progress Notes (Signed)
Pharmacy Antibiotic Note  Gregory Snyder is a 47 y.o. male admitted on 10/11/2022 presenting with neck swelling and drainage, hx of cancer s/p laryngectomy, also concern for aspiration pna.  Pharmacy has been consulted for zosyn dosing.  Plan: Zosyn 3.375 g IV every 8 hours (extended 4h infusion) Monitor renal function, clinical progression and LOT  Height: 5' 9"$  (175.3 cm) Weight: 81.6 kg (179 lb 14.3 oz) IBW/kg (Calculated) : 70.7  Temp (24hrs), Avg:100 F (37.8 C), Min:99.5 F (37.5 C), Max:100.4 F (38 C)  Recent Labs  Lab 10/11/22 1800 10/11/22 1812  WBC 5.4  --   CREATININE 1.09 1.10    Estimated Creatinine Clearance: 83.9 mL/min (by C-G formula based on SCr of 1.1 mg/dL).    Allergies  Allergen Reactions   Motrin [Ibuprofen] Hives    Bertis Ruddy, PharmD, [place] Pharmacist ED Pharmacist Phone # [phone] 10/11/2022 10:38 PM

## 2022-10-11 NOTE — Assessment & Plan Note (Addendum)
-  neck cellulitis spreading to stoma and to anterior chest  -CT neck soft tissue did not show any abscess  _ Blood culture x 2 have had no growth -MRSA screen is positive -continue IV Vancomycin/Unasyn -appears improved today.

## 2022-10-11 NOTE — Assessment & Plan Note (Addendum)
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 Reports previously following with ENT at [facility] last year but has not been following up since. - He has been evaluated by ENT. Plan is to continue the Shiley cuffless tube until laryngectomy tube is available/ordered - he is recommended to follow up with ENT every 3-6 months; will discuss with ENT regarding if he can establish care in [place] since unable to get to [facility] due to financial/social constraints

## 2022-10-11 NOTE — Assessment & Plan Note (Addendum)
-  stable on room air -Unasyn course completed inpatient -PRN suction of tracheostomy stoma

## 2022-10-11 NOTE — ED Notes (Signed)
Patient transported to X-ray 

## 2022-10-11 NOTE — Assessment & Plan Note (Addendum)
-  Has hx of untreated hepatitis C going back to 2022 and never followed up with GI -HCV RNA 1.85 million -needs to establish with primary and GI for treatment; TOC assisting with PCP at discharge

## 2022-10-11 NOTE — ED Provider Notes (Signed)
[facility] Provider Note   CSN: [id number] Arrival date & time: 10/11/22  1721     History  Chief Complaint  Patient presents with   Shortness of Breath   Emesis    Gregory Snyder is a 47 y.o. male history of throat cancer status post total laryngectomy, here presenting with neck swelling and headache and pain.  Patient states that he noticed right-sided neck swelling and drainage around his trach site for several days.  He states that he has some numbness around the area as well.  Patient also has some fever and cough as well.  Patient states that he had aspiration pneumonia previously.  He also had a G-tube but he is able to eat now.  The history is provided by the patient.       Home Medications Prior to Admission medications   Medication Sig Start Date End Date Taking? Authorizing Provider  amitriptyline (ELAVIL) 10 MG tablet Take 1 tablet (10 mg total) by mouth at bedtime. Patient not taking: Reported on 01/22/2022 01/22/22   Rick Duff, MD  doxycycline (VIBRAMYCIN) 100 MG capsule Take 1 capsule (100 mg total) by mouth 2 (two) times daily. 01/26/22   Azucena Cecil, PA-C  gabapentin (NEURONTIN) 100 MG capsule Take 1 capsule (100 mg total) by mouth 3 (three) times daily. 01/26/22   Azucena Cecil, PA-C  levothyroxine (SYNTHROID) 137 MCG tablet Take 1 tablet (137 mcg total) by mouth daily before breakfast. Patient not taking: Reported on 01/22/2022 01/22/22   Rick Duff, MD  Vitamin D, Ergocalciferol, (DRISDOL) 1.25 MG (50000 UNIT) CAPS capsule Take 1 capsule (50,000 Units total) by mouth every 7 (seven) days. Patient not taking: Reported on 01/22/2022 01/22/22   Rick Duff, MD      Allergies    Motrin [ibuprofen]    Review of Systems   Review of Systems  HENT:         Right neck pain  Respiratory:  Positive for shortness of breath.   Gastrointestinal:  Positive for vomiting.  All other systems  reviewed and are negative.   Physical Exam Updated Vital Signs BP 111/78   Pulse 80   Temp (!) 100.4 F (38 C) (Oral)   Resp 14   Ht 5' 9"$  (1.753 m)   Wt 81.6 kg   SpO2 97%   BMI 26.57 kg/m  Physical Exam Vitals and nursing note reviewed.  Constitutional:      Comments: Chronically ill-appearing  Neck:     Comments: Patient has erythema of the right side of the neck to the jaw.  Patient also has stoma from the trach tube with some surrounding erythema as well. Cardiovascular:     Rate and Rhythm: Normal rate.  Pulmonary:     Comments: Tachypneic and diminished bilateral bases Musculoskeletal:        General: Normal range of motion.  Skin:    General: Skin is warm.     Capillary Refill: Capillary refill takes less than 2 seconds.  Neurological:     General: No focal deficit present.     Mental Status: He is oriented to person, place, and time.  Psychiatric:        Mood and Affect: Mood normal.        Behavior: Behavior normal.     ED Results / Procedures / Treatments   Labs (all labs ordered are listed, but only abnormal results are displayed) Labs Reviewed  BASIC METABOLIC PANEL -  Abnormal; Notable for the following components:      Result Value   Sodium 133 (*)    Chloride 91 (*)    Glucose, Bld 107 (*)    Calcium 7.5 (*)    All other components within normal limits  CBC WITH DIFFERENTIAL/PLATELET - Abnormal; Notable for the following components:   RBC 2.96 (*)    Hemoglobin 9.2 (*)    HCT 28.2 (*)    Lymphs Abs 0.3 (*)    All other components within normal limits  I-STAT CHEM 8, ED - Abnormal; Notable for the following components:   Sodium 134 (*)    Chloride 90 (*)    BUN 22 (*)    Glucose, Bld 111 (*)    Calcium, Ion 0.92 (*)    Hemoglobin 9.5 (*)    HCT 28.0 (*)    All other components within normal limits  BRAIN NATRIURETIC PEPTIDE    EKG EKG Interpretation  Date/Time:  Monday October 11 2022 18:02:31 EST Ventricular Rate:  80 PR  Interval:  132 QRS Duration: 82 QT Interval:  368 QTC Calculation: 424 R Axis:   43 Text Interpretation: Normal sinus rhythm ST & T wave abnormality, consider anterior ischemia Abnormal ECG When compared with ECG of 13-Jan-2022 12:03, PREVIOUS ECG IS PRESENT Confirmed by Wandra Arthurs [phone]) on 10/11/2022 6:19:05 PM  Radiology No results found.  Procedures Procedures    Medications Ordered in ED Medications  sodium chloride 0.9 % bolus 1,000 mL (1,000 mLs Intravenous New Bag/Given 10/11/22 1925)  morphine (PF) 4 MG/ML injection 4 mg (4 mg Intravenous Given 10/11/22 1924)  ondansetron (ZOFRAN) injection 4 mg (4 mg Intravenous Given 10/11/22 1924)    ED Course/ Medical Decision Making/ A&P                             Medical Decision Making Gregory Snyder is a 47 y.o. male here presenting with right-sided neck swelling and also cough.  Concern for possible deep space infection of the neck versus cervical lymphadenopathy.  He has some headache as well also consider brain mets versus abscess to the brain.  Plan to get CT head with and without contrast and CT neck with contrast.  Will get CBC and BMP and also give broad-spectrum antibiotics  9:08 PM Labs showed normal white count.  CT head unremarkable but CT soft tissue neck did not show any abscess but he does have a pneumonia on CT.  Chest x-ray showed distended stomach.  He is abdomen is soft and nontender.  Patient has copious secretion from the trach site.  He states that he needs frequent suctioning and he is unable to do so at home.  Patient was given Zosyn in the ED.  Added on lactate and cultures.  He has recurrent aspiration pneumonia so anticipate that he will need antibiotics for aspiration pneumonia.  At this point, patient will be admitted for further management    Problems Addressed: Aspiration pneumonia of left upper lobe, unspecified aspiration pneumonia type [facility]): acute illness or injury Tracheitis: acute illness or  injury  Amount and/or Complexity of Data Reviewed Labs: ordered. Decision-making details documented in ED Course.  Risk Prescription drug management.    Final Clinical Impression(s) / ED Diagnoses Final diagnoses:  None    Rx / DC Orders ED Discharge Orders     None         Drenda Freeze, MD 10/11/22 2111

## 2022-10-11 NOTE — Assessment & Plan Note (Addendum)
-  has painful firm nodule on anterior right forearm; has had u/s x 2 which shows mild edema, no nodule or fluid collection

## 2022-10-12 ENCOUNTER — Inpatient Hospital Stay (HOSPITAL_COMMUNITY): Payer: Medicaid Other

## 2022-10-12 DIAGNOSIS — L03221 Cellulitis of neck: Secondary | ICD-10-CM | POA: Diagnosis not present

## 2022-10-12 LAB — CBC
HCT: 24.7 % — ABNORMAL LOW (ref 39.0–52.0)
Hemoglobin: 8.2 g/dL — ABNORMAL LOW (ref 13.0–17.0)
MCH: 31.4 pg (ref 26.0–34.0)
MCHC: 33.2 g/dL (ref 30.0–36.0)
MCV: 94.6 fL (ref 80.0–100.0)
Platelets: 160 10*3/uL (ref 150–400)
RBC: 2.61 MIL/uL — ABNORMAL LOW (ref 4.22–5.81)
RDW: 13.1 % (ref 11.5–15.5)
WBC: 3.6 10*3/uL — ABNORMAL LOW (ref 4.0–10.5)
nRBC: 0 % (ref 0.0–0.2)

## 2022-10-12 LAB — IRON AND TIBC
Iron: 36 ug/dL — ABNORMAL LOW (ref 45–182)
Saturation Ratios: 11 % — ABNORMAL LOW (ref 17.9–39.5)
TIBC: 329 ug/dL (ref 250–450)
UIBC: 293 ug/dL

## 2022-10-12 LAB — BASIC METABOLIC PANEL
Anion gap: 10 (ref 5–15)
BUN: 17 mg/dL (ref 6–20)
CO2: 29 mmol/L (ref 22–32)
Calcium: 6.7 mg/dL — ABNORMAL LOW (ref 8.9–10.3)
Chloride: 95 mmol/L — ABNORMAL LOW (ref 98–111)
Creatinine, Ser: 1.08 mg/dL (ref 0.61–1.24)
GFR, Estimated: >60 mL/min (ref >60)
Glucose, Bld: 144 mg/dL — ABNORMAL HIGH (ref 70–99)
Potassium: 3.4 mmol/L — ABNORMAL LOW (ref 3.5–5.1)
Sodium: 134 mmol/L — ABNORMAL LOW (ref 135–145)

## 2022-10-12 LAB — LACTIC ACID, PLASMA: Lactic Acid, Venous: 0.7 mmol/L (ref 0.5–1.9)

## 2022-10-12 LAB — HEPATIC FUNCTION PANEL
ALT: 40 U/L (ref 0–44)
AST: 73 U/L — ABNORMAL HIGH (ref 15–41)
Albumin: 3 g/dL — ABNORMAL LOW (ref 3.5–5.0)
Alkaline Phosphatase: 55 U/L (ref 38–126)
Bilirubin, Direct: 0.1 mg/dL (ref 0.0–0.2)
Indirect Bilirubin: 0.2 mg/dL — ABNORMAL LOW (ref 0.3–0.9)
Total Bilirubin: 0.3 mg/dL (ref 0.3–1.2)
Total Protein: 6.7 g/dL (ref 6.5–8.1)

## 2022-10-12 LAB — MAGNESIUM: Magnesium: 2.1 mg/dL (ref 1.7–2.4)

## 2022-10-12 LAB — FERRITIN: Ferritin: 146 ng/mL (ref 24–336)

## 2022-10-12 LAB — TSH: TSH: 56.362 u[IU]/mL — ABNORMAL HIGH (ref 0.350–4.500)

## 2022-10-12 MED ORDER — VANCOMYCIN HCL 1750 MG/350ML IV SOLN
1750.0000 mg | Freq: Once | INTRAVENOUS | Status: AC
  Administered 2022-10-12: 1750 mg via INTRAVENOUS
  Filled 2022-10-12: qty 350

## 2022-10-12 MED ORDER — VANCOMYCIN HCL 750 MG/150ML IV SOLN
750.0000 mg | Freq: Three times a day (TID) | INTRAVENOUS | Status: DC
  Administered 2022-10-13 – 2022-10-15 (×7): 750 mg via INTRAVENOUS
  Filled 2022-10-12 (×10): qty 150

## 2022-10-12 MED ORDER — SODIUM CHLORIDE 0.9 % IV SOLN
3.0000 g | Freq: Four times a day (QID) | INTRAVENOUS | Status: AC
  Administered 2022-10-12 – 2022-10-18 (×26): 3 g via INTRAVENOUS
  Filled 2022-10-12 (×27): qty 8

## 2022-10-12 MED ORDER — VANCOMYCIN HCL 1750 MG/350ML IV SOLN
1750.0000 mg | Freq: Once | INTRAVENOUS | Status: DC
  Filled 2022-10-12: qty 350

## 2022-10-12 MED ORDER — LEVOTHYROXINE SODIUM 25 MCG PO TABS
137.0000 ug | ORAL_TABLET | Freq: Every day | ORAL | Status: DC
  Administered 2022-10-13 – 2022-10-22 (×10): 137 ug via ORAL
  Filled 2022-10-12 (×10): qty 1

## 2022-10-12 MED ORDER — OXYCODONE HCL 5 MG PO TABS
2.5000 mg | ORAL_TABLET | Freq: Once | ORAL | Status: AC
  Administered 2022-10-12: 2.5 mg via ORAL
  Filled 2022-10-12: qty 1

## 2022-10-12 MED ORDER — METHYLPREDNISOLONE SODIUM SUCC 125 MG IJ SOLR
125.0000 mg | INTRAMUSCULAR | Status: AC
  Administered 2022-10-12: 125 mg via INTRAVENOUS
  Filled 2022-10-12: qty 2

## 2022-10-12 NOTE — ED Notes (Signed)
IV team consult placed for pt.

## 2022-10-12 NOTE — Consult Note (Signed)
ENT CONSULT:  Reason for Consult: Laryngectoma stoma erythema  Referring Physician:  Laurey Arrow MD  HPI: Gregory Snyder is an 47 y.o. male with a history of total laryngectomy, neck dissections, flap reconstruction May 2022 and adjuvant CRT who [facility] with headache, shortness of breath and neck pain. ED workup notable for aspiration pneumonia, admitted to medicine service. ENT Consulted due to concern for stoma infection. The patient was previously [facility] 2023 for aspiration pneumonia, seen previously by my partner, Dr. Marzetta Merino.   By the time of my consultation, the patient was admitted to the inpatient ward. Notably, the patient has not had a lary tube present for months. Respiratory therapy with difficulty passing soft suction catheter into the trachea. Currently on tracheostomy mist. The patient endorses shortness of breath and neck pain. States he has not followed up with his treating head and neck surgeon/radiation oncologist in approximately 10 months. The last office visit at [facility] documented in [place] was 06/05/2021 - NP. Lauren Shepanski.   Past Medical History:  Diagnosis Date   Cancer [facility])     Past Surgical History:  Procedure Laterality Date   TOTAL LARYNGECTOMY N/A 12/2020    History reviewed. No pertinent family history.  Social History:  reports that he has quit smoking. His smoking use included cigarettes. He smoked an average of 1 pack per day. He has never used smokeless tobacco. He reports current alcohol use. He reports that he does not use drugs.  Allergies:  Allergies  Allergen Reactions   Motrin [Ibuprofen] Hives    Medications: I have reviewed the patient's current medications.  Results for orders placed or performed during the hospital encounter of 10/11/22 (from the past 48 hour(s))  Basic metabolic panel     Status: Abnormal   Collection Time: 10/11/22  6:00 PM  Result Value Ref Range   Sodium 133 (L) 135  - 145 mmol/L   Potassium 3.7 3.5 - 5.1 mmol/L   Chloride 91 (L) 98 - 111 mmol/L   CO2 32 22 - 32 mmol/L   Glucose, Bld 107 (H) 70 - 99 mg/dL    Comment: Glucose reference range applies only to samples taken after fasting for at least 8 hours.   BUN 19 6 - 20 mg/dL   Creatinine, Ser 1.09 0.61 - 1.24 mg/dL   Calcium 7.5 (L) 8.9 - 10.3 mg/dL   GFR, Estimated >60 >60 mL/min    Comment: (NOTE) Calculated using the CKD-EPI Creatinine Equation (2021)    Anion gap 10 5 - 15    Comment: Performed at [facility] [address], [place], [place] [address]  CBC with Differential     Status: Abnormal   Collection Time: 10/11/22  6:00 PM  Result Value Ref Range   WBC 5.4 4.0 - 10.5 K/uL   RBC 2.96 (L) 4.22 - 5.81 MIL/uL   Hemoglobin 9.2 (L) 13.0 - 17.0 g/dL   HCT 28.2 (L) 39.0 - 52.0 %   MCV 95.3 80.0 - 100.0 fL   MCH 31.1 26.0 - 34.0 pg   MCHC 32.6 30.0 - 36.0 g/dL   RDW 12.9 11.5 - 15.5 %   Platelets 169 150 - 400 K/uL   nRBC 0.0 0.0 - 0.2 %   Neutrophils Relative % 86 %   Neutro Abs 4.7 1.7 - 7.7 K/uL   Lymphocytes Relative 5 %   Lymphs Abs 0.3 (L) 0.7 - 4.0 K/uL   Monocytes Relative  7 %   Monocytes Absolute 0.4 0.1 - 1.0 K/uL   Eosinophils Relative 1 %   Eosinophils Absolute 0.0 0.0 - 0.5 K/uL   Basophils Relative 0 %   Basophils Absolute 0.0 0.0 - 0.1 K/uL   Immature Granulocytes 1 %   Abs Immature Granulocytes 0.03 0.00 - 0.07 K/uL    Comment: Performed at [facility] [address] [place], [place] [address]  Brain natriuretic peptide     Status: None   Collection Time: 10/11/22  6:04 PM  Result Value Ref Range   B Natriuretic Peptide 22.2 0.0 - 100.0 pg/mL    Comment: Performed at [facility] [address] [place], [place] [address]  I-stat chem 8, ED     Status: Abnormal   Collection Time: 10/11/22  6:12 PM  Result Value Ref Range   Sodium 134 (L) 135 - 145 mmol/L   Potassium 3.8 3.5 - 5.1 mmol/L   Chloride 90 (L) 98 - 111 mmol/L   BUN 22 (H)  6 - 20 mg/dL   Creatinine, Ser 1.10 0.61 - 1.24 mg/dL   Glucose, Bld 111 (H) 70 - 99 mg/dL    Comment: Glucose reference range applies only to samples taken after fasting for at least 8 hours.   Calcium, Ion 0.92 (L) 1.15 - 1.40 mmol/L   TCO2 32 22 - 32 mmol/L   Hemoglobin 9.5 (L) 13.0 - 17.0 g/dL   HCT 28.0 (L) 39.0 - 52.0 %  Blood culture (routine x 2)     Status: None (Preliminary result)   Collection Time: 10/11/22  9:06 PM   Specimen: BLOOD  Result Value Ref Range   Specimen Description BLOOD BLOOD RIGHT HAND    Special Requests      BOTTLES DRAWN AEROBIC AND ANAEROBIC Blood Culture adequate volume   Culture      NO GROWTH < 12 HOURS Performed at [facility] [address] [place], [place] [address]    Report Status PENDING   Resp panel by RT-PCR (RSV, Flu A&B, Covid)     Status: None   Collection Time: 10/11/22  9:06 PM   Specimen: Nasal Swab  Result Value Ref Range   SARS Coronavirus 2 by RT PCR NEGATIVE NEGATIVE   Influenza A by PCR NEGATIVE NEGATIVE   Influenza B by PCR NEGATIVE NEGATIVE    Comment: (NOTE) The Xpert Xpress SARS-CoV-2/FLU/RSV plus assay is intended as an aid in the diagnosis of influenza from Nasopharyngeal swab specimens and should not be used as a sole basis for treatment. Nasal washings and aspirates are unacceptable for Xpert Xpress SARS-CoV-2/FLU/RSV testing.  Fact Sheet for Patients: EntrepreneurPulse.com.au  Fact Sheet for Healthcare Providers: IncredibleEmployment.be  This test is not yet approved or cleared by the [place] FDA and has been authorized for detection and/or diagnosis of SARS-CoV-2 by FDA under an Emergency Use Authorization (EUA). This EUA will remain in effect (meaning this test can be used) for the duration of the COVID-19 declaration under Section 564(b)(1) of the Act, 21 U.S.C. section 360bbb-3(b)(1), unless the authorization is terminated or revoked.     Resp  Syncytial Virus by PCR NEGATIVE NEGATIVE    Comment: (NOTE) Fact Sheet for Patients: EntrepreneurPulse.com.au  Fact Sheet for Healthcare Providers: IncredibleEmployment.be  This test is not yet approved or cleared by the [place] FDA and has been authorized for detection and/or diagnosis of SARS-CoV-2 by FDA under an Emergency Use Authorization (EUA). This EUA will  remain in effect (meaning this test can be used) for the duration of the COVID-19 declaration under Section 564(b)(1) of the Act, 21 U.S.C. section 360bbb-3(b)(1), unless the authorization is terminated or revoked.  Performed at [facility], [place] [address], [place], [place] [address]   Blood culture (routine x 2)     Status: None (Preliminary result)   Collection Time: 10/11/22  9:11 PM   Specimen: BLOOD  Result Value Ref Range   Specimen Description BLOOD BLOOD LEFT HAND    Special Requests      BOTTLES DRAWN AEROBIC AND ANAEROBIC Blood Culture adequate volume   Culture      NO GROWTH < 12 HOURS Performed at [facility], [place] [address] [place], [place] [address]    Report Status PENDING   Lactic acid, plasma     Status: None   Collection Time: 10/11/22 10:56 PM  Result Value Ref Range   Lactic Acid, Venous 0.6 0.5 - 1.9 mmol/L    Comment: Performed at [facility] [address], [place], [place] [address]  TSH     Status: Abnormal   Collection Time: 10/11/22 10:56 PM  Result Value Ref Range   TSH 56.362 (H) 0.350 - 4.500 uIU/mL    Comment: Performed by a 3rd Generation assay with a functional sensitivity of <=0.01 uIU/mL. Performed at [facility], [place] [address], [place], [place] [address]   Lactic acid, plasma     Status: None   Collection Time: 10/12/22  3:30 AM  Result Value Ref Range   Lactic Acid, Venous 0.7 0.5 - 1.9 mmol/L    Comment: Performed at [facility] [address] [place], [place] [address]  CBC     Status:  Abnormal   Collection Time: 10/12/22  3:30 AM  Result Value Ref Range   WBC 3.6 (L) 4.0 - 10.5 K/uL   RBC 2.61 (L) 4.22 - 5.81 MIL/uL   Hemoglobin 8.2 (L) 13.0 - 17.0 g/dL   HCT 24.7 (L) 39.0 - 52.0 %   MCV 94.6 80.0 - 100.0 fL   MCH 31.4 26.0 - 34.0 pg   MCHC 33.2 30.0 - 36.0 g/dL   RDW 13.1 11.5 - 15.5 %   Platelets 160 150 - 400 K/uL   nRBC 0.0 0.0 - 0.2 %    Comment: Performed at [facility], [place] [address] [place], [place] [id number]  Basic metabolic panel     Status: Abnormal   Collection Time: 10/12/22  3:30 AM  Result Value Ref Range   Sodium 134 (L) 135 - 145 mmol/L   Potassium 3.4 (L) 3.5 - 5.1 mmol/L   Chloride 95 (L) 98 - 111 mmol/L   CO2 29 22 - 32 mmol/L   Glucose, Bld 144 (H) 70 - 99 mg/dL    Comment: Glucose reference range applies only to samples taken after fasting for at least 8 hours.   BUN 17 6 - 20 mg/dL   Creatinine, Ser 1.08 0.61 - 1.24 mg/dL   Calcium 6.7 (L) 8.9 - 10.3 mg/dL   GFR, Estimated >60 >60 mL/min    Comment: (NOTE) Calculated using the CKD-EPI Creatinine Equation (2021)    Anion gap 10 5 - 15    Comment: Performed at [facility] [address] [place], [place] [address]  Magnesium     Status: None   Collection Time: 10/12/22  3:30 AM  Result Value Ref Range  Magnesium 2.1 1.7 - 2.4 mg/dL    Comment: Performed at [facility], [place] [address] [place], [place] [address]  Hepatic function panel     Status: Abnormal   Collection Time: 10/12/22  5:34 PM  Result Value Ref Range   Total Protein 6.7 6.5 - 8.1 g/dL   Albumin 3.0 (L) 3.5 - 5.0 g/dL   AST 73 (H) 15 - 41 U/L   ALT 40 0 - 44 U/L   Alkaline Phosphatase 55 38 - 126 U/L   Total Bilirubin 0.3 0.3 - 1.2 mg/dL   Bilirubin, Direct 0.1 0.0 - 0.2 mg/dL   Indirect Bilirubin 0.2 (L) 0.3 - 0.9 mg/dL    Comment: Performed at [facility] [address], [place], [place] [address]  Iron and TIBC     Status: Abnormal   Collection Time: 10/12/22  5:34 PM  Result  Value Ref Range   Iron 36 (L) 45 - 182 ug/dL   TIBC 329 250 - 450 ug/dL   Saturation Ratios 11 (L) 17.9 - 39.5 %   UIBC 293 ug/dL    Comment: Performed at [facility], [place] [address] [place], [place] [address]  Ferritin     Status: None   Collection Time: 10/12/22  5:34 PM  Result Value Ref Range   Ferritin 146 24 - 336 ng/mL    Comment: Performed at [facility] [address] [place], [place] [address]    CT CHEST WO CONTRAST  Result Date: 10/12/2022 CLINICAL DATA:  Shortness of breath. EXAM: CT CHEST WITHOUT CONTRAST TECHNIQUE: Multidetector CT imaging of the chest was performed following the standard protocol without IV contrast. RADIATION DOSE REDUCTION: This exam was performed according to the departmental dose-optimization program which includes automated exposure control, adjustment of the mA and/or kV according to patient size and/or use of iterative reconstruction technique. COMPARISON:  Radiograph of October 11, 2022. CT scan of April 17, 2015. FINDINGS: Cardiovascular: No significant vascular findings. Normal heart size. No pericardial effusion. Mediastinum/Nodes: Status post laryngectomy. No adenopathy is noted. The esophagus is unremarkable. Lungs/Pleura: No pneumothorax or pleural effusion is noted. Mild bilateral posterior basilar subsegmental atelectasis is noted. Multiple small patchy airspace opacities are noted in the left upper lobe concerning for multifocal pneumonia. Upper Abdomen: Nonobstructive left nephrolithiasis. Musculoskeletal: No chest wall mass or suspicious bone lesions identified. IMPRESSION: Mild bilateral posterior basilar subsegmental atelectasis. Multiple small patchy airspace opacities are noted in left upper lobe concerning for multifocal pneumonia. Status post laryngectomy. Nonobstructive left nephrolithiasis. Electronically Signed   By: Marijo Conception M.D.   On: 10/12/2022 14:48   [place] RT UPPER EXTREM LTD SOFT TISSUE NON VASCULAR  Result  Date: 10/11/2022 CLINICAL DATA:  Palpable nodule, rule out abscess EXAM: ULTRASOUND RIGHT UPPER EXTREMITY LIMITED TECHNIQUE: Ultrasound examination of the upper extremity soft tissues was performed in the area of clinical concern in the distal forearm. COMPARISON:  None Available. FINDINGS: Subcutaneous soft tissue edema. No appreciable fluid collection or abscess. IMPRESSION: Subcutaneous soft tissue edema. No appreciable fluid collection or abscess. Electronically Signed   By: Keane Police D.O.   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 EXAM: CT HEAD WITHOUT AND WITH CONTRAST TECHNIQUE: Contiguous axial images were obtained from the base of the skull through the vertex without and with intravenous contrast. RADIATION DOSE REDUCTION: This exam was performed  according to the departmental dose-optimization program which includes automated exposure control, adjustment of the mA and/or kV according to patient size and/or use of iterative reconstruction technique. CONTRAST:  51m OMNIPAQUE IOHEXOL 350 MG/ML SOLN COMPARISON:  CT head 01/22/2022 FINDINGS: Brain: There is no acute intracranial hemorrhage, extra-axial fluid collection, or acute infarct. Parenchymal volume is normal. The ventricles are normal in size. Gray-white differentiation is preserved. There is no mass lesion or abnormal enhancement. The pituitary and suprasellar region are normal. There is no mass effect or midline shift. Vascular: No hyperdense vessel or unexpected calcification. Visible vessels are patent. Skull: Normal. Negative for fracture or focal lesion. Sinuses/Orbits: There is mild mucosal thickening in the paranasal sinuses. The globes and orbits are unremarkable. Other: None. IMPRESSION: No acute intracranial pathology or evidence of intracranial metastatic disease. Electronically Signed   By: PValetta MoleM.D.   On: 10/11/2022 20:34    CT Soft Tissue Neck W Contrast  Result Date: 10/11/2022 CLINICAL DATA:  Face pain, chills, and vomiting for 3 days. History of head/neck cancer with total laryngectomy. EXAM: CT NECK WITH CONTRAST TECHNIQUE: Multidetector CT imaging of the neck was performed using the standard protocol following the bolus administration of intravenous contrast. RADIATION DOSE REDUCTION: This exam was performed according to the departmental dose-optimization program which includes automated exposure control, adjustment of the mA and/or kV according to patient size and/or use of iterative reconstruction technique. CONTRAST:  77mOMNIPAQUE IOHEXOL 350 MG/ML SOLN COMPARISON:  CT neck 01/26/2021 FINDINGS: Pharynx and larynx: The nasal cavity and nasopharynx are unremarkable. The parapharyngeal spaces are clear. The status post total laryngectomy, partial glossectomy, and partial pharyngectomy. There is no suspicious mucosal enhancement or solid mass lesion along the postsurgical aerodigestive tract. Soft tissue thickening in the subcutaneous tissues of the neck anteriorly as well as posterolateral to the right internal jugular vein is overall similar to the prior study. Infiltrative soft tissue thickening surrounding the right internal carotid artery is unchanged (4-54). Diffuse subcutaneous edema is also overall similar. There is no new suspicious finding. A TEP device is in place. Salivary glands: The parotid glands are unremarkable. The right submandibular gland is surgically absent. The left submandibular gland is unremarkable. Thyroid: Surgically absent. Lymph nodes: The patient is status post bilateral neck dissection. There is no pathologic lymphadenopathy in the neck. Vascular: There is unchanged narrowing of the right internal jugular vein in the upper neck. The left internal jugular vein is occluded/surgically absent. The carotid arteries are patent. Limited intracranial: Assessed on the separately dictated head CT.  Visualized orbits: The globes and orbits are unremarkable. Mastoids and visualized paranasal sinuses: The mastoid air cells and middle ear cavities are clear. The paranasal sinuses are clear. Skeleton: There is no acute osseous abnormality or suspicious osseous lesion. Upper chest: There is debris in the trachea. There is patchy ground-glass opacity in the left upper lobe. Other: None. IMPRESSION: 1. Stable posttreatment appearance of the neck since 01/26/2022 with no new or suspicious finding. 2. Small amount of debris in the trachea. 3. Patchy ground-glass opacity in the left upper lobe favored infectious/inflammatory in etiology. Electronically Signed   By: PeValetta Mole.D.   On: 10/11/2022 20:23   DG Chest 2 View  Result Date: 10/11/2022 CLINICAL DATA:  Shortness of breath EXAM: CHEST - 2 VIEW COMPARISON:  CT chest abdomen and pelvis 04/17/2015 FINDINGS: There is air under the right hemidiaphragm, most likely within the colon. There is gaseous distention of the colon under the left hemidiaphragm as  well. The lungs are clear. There is no pleural effusion or pneumothorax. There is a large air-fluid level in the stomach. The cardiomediastinal silhouette is within normal limits. No acute fractures are seen. IMPRESSION: 1. Gaseous distention of the colon with large air-fluid level in the stomach. Please correlate clinically. Consider dedicated abdominal imaging. 2. No acute cardiopulmonary process. Electronically Signed   By: Ronney Asters M.D.   On: 10/11/2022 19:39    [id number] other than stated per HPI  Blood pressure 116/78, pulse (!) 26, temperature (!) 97.5 F (36.4 C), temperature source Oral, resp. rate 14, height 5' 9"$  (1.753 m), weight 81.6 kg, SpO2 100 %.  PHYSICAL EXAM:  CONSTITUTIONAL: well developed, nourished, no distress and alert. Aphonic due to laryngectomy.  EYES: PERRL, EOMI  HENT: Head : normocephalic and atraumatic Ears: External ears normal.  Nose: nose normal and no  purulence Mouth/Throat: Trismus 2-3cm. FOM soft. Poor dentition.  Mouth: uvula midline and no oral lesions NECK: Post-radiation changes. Mild tenderness without discrete mass right lateral neck. Laryngectomy stoma slightly narrowed - with large mucous plug nearly obstructing the proximal stoma/trachea anterior to a tracheo-esophageal prosthesis. Saline rinses and soft suction catheter were used to debride and completely remove the obstructive mucous plug. A shiley 4-0 tracheostomy tube was placed and secured.  There is no sign of cancer recurrence on exam. Proximal trachea appears normal with exception of TEP.  NEURO: CN II-XII symmetric intact   Studies Reviewed:CT NECK with contrast 10/12/22  Narrative & Impression  CLINICAL DATA:  Face pain, chills, and vomiting for 3 days. History of head/neck cancer with total laryngectomy.   EXAM: CT NECK WITH CONTRAST   TECHNIQUE: Multidetector CT imaging of the neck was performed using the standard protocol following the bolus administration of intravenous contrast.   RADIATION DOSE REDUCTION: This exam was performed according to the departmental dose-optimization program which includes automated exposure control, adjustment of the mA and/or kV according to patient size and/or use of iterative reconstruction technique.   CONTRAST:  14m OMNIPAQUE IOHEXOL 350 MG/ML SOLN   COMPARISON:  CT neck 01/26/2021   FINDINGS: Pharynx and larynx: The nasal cavity and nasopharynx are unremarkable.   The parapharyngeal spaces are clear.   The status post total laryngectomy, partial glossectomy, and partial pharyngectomy. There is no suspicious mucosal enhancement or solid mass lesion along the postsurgical aerodigestive tract. Soft tissue thickening in the subcutaneous tissues of the neck anteriorly as well as posterolateral to the right internal jugular vein is overall similar to the prior study. Infiltrative soft tissue thickening surrounding the  right internal carotid artery is unchanged (4-54). Diffuse subcutaneous edema is also overall similar. There is no new suspicious finding. A TEP device is in place.   Salivary glands: The parotid glands are unremarkable. The right submandibular gland is surgically absent. The left submandibular gland is unremarkable.   Thyroid: Surgically absent.   Lymph nodes: The patient is status post bilateral neck dissection. There is no pathologic lymphadenopathy in the neck.   Vascular: There is unchanged narrowing of the right internal jugular vein in the upper neck. The left internal jugular vein is occluded/surgically absent. The carotid arteries are patent.   Limited intracranial: Assessed on the separately dictated head CT.   Visualized orbits: The globes and orbits are unremarkable.   Mastoids and visualized paranasal sinuses: The mastoid air cells and middle ear cavities are clear. The paranasal sinuses are clear.   Skeleton: There is no acute osseous abnormality or suspicious osseous lesion.  Upper chest: There is debris in the trachea. There is patchy ground-glass opacity in the left upper lobe.   Other: None.   IMPRESSION: 1. Stable posttreatment appearance of the neck since 01/26/2022 with no new or suspicious finding. 2. Small amount of debris in the trachea. 3. Patchy ground-glass opacity in the left upper lobe favored infectious/inflammatory in etiology.     Electronically Signed   By: Valetta Mole M.D.   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 Recurrent aspiration pneumonia is likely a consequence of the patient's tracheo-esophageal prosthesis leaking esophageal secretions into the lungs. Airway now stable.  No signs of neck infection or disease recurrence.   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 Instructed RT to order Laryngectomy tubes and HME's.  I recommend close follow up with [facility] and Neck Surgery to re-evaluate the patient for routine cancer surveillance (should be every 3-6 months) as well as reassess the tracheo-esophageal prosthesis to consider replacement or removal and closure given recurrent aspiration pneumonia.  Continue medical management of aspiration pneumonia per primary team  I have personally spent 45 minutes involved in face-to-face and non-face-to-face activities for this patient on the day of the visit.  Professional time spent includes the following activities, in addition to those noted in the documentation: preparing to see the patient (eg, review of tests), obtaining and/or reviewing separately obtained history, performing a medically appropriate examination and/or evaluation, counseling and educating the patient/family/caregiver, ordering medications, tests or procedures, referring and communicating with other healthcare professionals, documenting clinical information in the electronic or other health record, independently interpreting results and communicating results with the patient/family/caregiver, care coordination.  Electronically signed by:  Jenetta Downer, MD  Staff Physician Facial Plastic & Reconstructive Surgery Otolaryngology - Head and Neck Surgery [address], [place]   10/12/2022, 7:42 PM

## 2022-10-12 NOTE — ED Notes (Signed)
ED TO INPATIENT HANDOFF REPORT   S Name/Age/Gender Gregory Snyder 47 y.o. male Room/Bed: 045C/045C  Code Status   Code Status: Full Code  Home/SNF/Other Home Patient oriented to: self, place, time, and situation Is this baseline? Yes   Triage Complete: Triage complete  Chief Complaint Aspiration pneumonia ([place]) [J69.0]  Triage Note Pt c/o SOB, face pain, chills and vomitingx3d. Pt has a hole in throat, but no tracheostomy tube. Pt is a little tachypneic. Pt's right side of face is erythematous. Pt has right facial droopx3d. Pt states LKW 10/08/22. Pt not sure what time his LKW was or when the sx started on 10/09/22. Pt c/o right shoulder painx3d. Pt has decreased ROM of right shoulder. Pt has abscess approx the size of a nickle on right anterior forearm that is painful. Pt has wound on right thumbx4d. The wound measure 1.09cmx3cm and the bedding is pink.    Allergies Allergies  Allergen Reactions   Motrin [Ibuprofen] Hives    Level of Care/Admitting Diagnosis ED Disposition     ED Disposition  Admit   Condition  --   [place]: [place] [[id number]]  Level of Care: Telemetry Medical [104]  May admit patient to [facility] or [facility] if equivalent level of care is available:: No  Covid Evaluation: Asymptomatic - no recent exposure (last 10 days) testing not required  Diagnosis: Aspiration pneumonia [facility]) [id number]  Admitting Physician: Orene Desanctis [id number]  Attending Physician: Orene Desanctis [id number]  Certification:: I certify this patient will need inpatient services for at least 2 midnights  Estimated Length of Stay: 3          B Medical/Surgery History Past Medical History:  Diagnosis Date   Cancer [facility])    Past Surgical History:  Procedure Laterality Date   TOTAL LARYNGECTOMY N/A 12/2020     A IV Location/Drains/Wounds Patient Lines/Drains/Airways Status     Active Line/Drains/Airways     Name Placement date Placement  time Site Days   Peripheral IV 10/11/22 20 G Right Antecubital 10/11/22  1925  Antecubital  1            Intake/Output Last 24 hours  Intake/Output Summary (Last 24 hours) at 10/12/2022 1353 Last data filed at 10/12/2022 0726 Gross per 24 hour  Intake 50 ml  Output --  Net 50 ml    Labs/Imaging Results for orders placed or performed during the hospital encounter of 10/11/22 (from the past 48 hour(s))  Basic metabolic panel     Status: Abnormal   Collection Time: 10/11/22  6:00 PM  Result Value Ref Range   Sodium 133 (L) 135 - 145 mmol/L   Potassium 3.7 3.5 - 5.1 mmol/L   Chloride 91 (L) 98 - 111 mmol/L   CO2 32 22 - 32 mmol/L   Glucose, Bld 107 (H) 70 - 99 mg/dL    Comment: Glucose reference range applies only to samples taken after fasting for at least 8 hours.   BUN 19 6 - 20 mg/dL   Creatinine, Ser 1.09 0.61 - 1.24 mg/dL   Calcium 7.5 (L) 8.9 - 10.3 mg/dL   GFR, Estimated >60 >60 mL/min    Comment: (NOTE) Calculated using the CKD-EPI Creatinine Equation (2021)    Anion gap 10 5 - 15    Comment: Performed at [facility] [address] [place],  [address]  CBC with Differential     Status: Abnormal   Collection Time: 10/11/22  6:00 PM  Result Value Ref Range   WBC 5.4 4.0 - 10.5 K/uL   RBC 2.96 (L) 4.22 - 5.81 MIL/uL   Hemoglobin 9.2 (L) 13.0 - 17.0 g/dL   HCT 28.2 (L) 39.0 - 52.0 %   MCV 95.3 80.0 - 100.0 fL   MCH 31.1 26.0 - 34.0 pg   MCHC 32.6 30.0 - 36.0 g/dL   RDW 12.9 11.5 - 15.5 %   Platelets 169 150 - 400 K/uL   nRBC 0.0 0.0 - 0.2 %   Neutrophils Relative % 86 %   Neutro Abs 4.7 1.7 - 7.7 K/uL   Lymphocytes Relative 5 %   Lymphs Abs 0.3 (L) 0.7 - 4.0 K/uL   Monocytes Relative 7 %   Monocytes Absolute 0.4 0.1 - 1.0 K/uL   Eosinophils Relative 1 %   Eosinophils Absolute 0.0 0.0 - 0.5 K/uL   Basophils Relative 0 %   Basophils Absolute 0.0 0.0 - 0.1 K/uL   Immature Granulocytes 1 %   Abs Immature Granulocytes 0.03 0.00 - 0.07 K/uL     Comment: Performed at [facility], [address], [place], [place] [address]  Brain natriuretic peptide     Status: None   Collection Time: 10/11/22  6:04 PM  Result Value Ref Range   B Natriuretic Peptide 22.2 0.0 - 100.0 pg/mL    Comment: Performed at [facility] [address], [place], [place] [address]  I-stat chem 8, ED     Status: Abnormal   Collection Time: 10/11/22  6:12 PM  Result Value Ref Range   Sodium 134 (L) 135 - 145 mmol/L   Potassium 3.8 3.5 - 5.1 mmol/L   Chloride 90 (L) 98 - 111 mmol/L   BUN 22 (H) 6 - 20 mg/dL   Creatinine, Ser 1.10 0.61 - 1.24 mg/dL   Glucose, Bld 111 (H) 70 - 99 mg/dL    Comment: Glucose reference range applies only to samples taken after fasting for at least 8 hours.   Calcium, Ion 0.92 (L) 1.15 - 1.40 mmol/L   TCO2 32 22 - 32 mmol/L   Hemoglobin 9.5 (L) 13.0 - 17.0 g/dL   HCT 28.0 (L) 39.0 - 52.0 %  Blood culture (routine x 2)     Status: None (Preliminary result)   Collection Time: 10/11/22  9:06 PM   Specimen: BLOOD  Result Value Ref Range   Specimen Description BLOOD BLOOD RIGHT HAND    Special Requests      BOTTLES DRAWN AEROBIC AND ANAEROBIC Blood Culture adequate volume   Culture      NO GROWTH < 12 HOURS Performed at [facility] [address] [place], [place] [address]    Report Status PENDING   Resp panel by RT-PCR (RSV, Flu A&B, Covid)     Status: None   Collection Time: 10/11/22  9:06 PM   Specimen: Nasal Swab  Result Value Ref Range   SARS Coronavirus 2 by RT PCR NEGATIVE NEGATIVE   Influenza A by PCR NEGATIVE NEGATIVE   Influenza B by PCR NEGATIVE NEGATIVE    Comment: (NOTE) The Xpert Xpress SARS-CoV-2/FLU/RSV plus assay is intended as an aid in the diagnosis of influenza from Nasopharyngeal swab specimens and should not be used as a sole basis for treatment. Nasal washings and aspirates are unacceptable for Xpert Xpress SARS-CoV-2/FLU/RSV testing.  Fact Sheet for  Patients: EntrepreneurPulse.com.au  Fact Sheet for Healthcare Providers: IncredibleEmployment.be  This test is not  yet approved or cleared by the [place] and has been authorized for detection and/or diagnosis of SARS-CoV-2 by FDA under an Emergency Use Authorization (EUA). This EUA will remain in effect (meaning this test can be used) for the duration of the COVID-19 declaration under Section 564(b)(1) of the Act, 21 U.S.C. section 360bbb-3(b)(1), unless the authorization is terminated or revoked.     Resp Syncytial Virus by PCR NEGATIVE NEGATIVE    Comment: (NOTE) Fact Sheet for Patients: EntrepreneurPulse.com.au  Fact Sheet for Healthcare Providers: IncredibleEmployment.be  This test is not yet approved or cleared by the [place] FDA and has been authorized for detection and/or diagnosis of SARS-CoV-2 by FDA under an Emergency Use Authorization (EUA). This EUA will remain in effect (meaning this test can be used) for the duration of the COVID-19 declaration under Section 564(b)(1) of the Act, 21 U.S.C. section 360bbb-3(b)(1), unless the authorization is terminated or revoked.  Performed at [facility], [place] [address] [place], [place] [address]   Blood culture (routine x 2)     Status: None (Preliminary result)   Collection Time: 10/11/22  9:11 PM   Specimen: BLOOD  Result Value Ref Range   Specimen Description BLOOD BLOOD LEFT HAND    Special Requests      BOTTLES DRAWN AEROBIC AND ANAEROBIC Blood Culture adequate volume   Culture      NO GROWTH < 12 HOURS Performed at [facility], [place] [address], [place], [place] [address]    Report Status PENDING   Lactic acid, plasma     Status: None   Collection Time: 10/11/22 10:56 PM  Result Value Ref Range   Lactic Acid, Venous 0.6 0.5 - 1.9 mmol/L    Comment: Performed at [facility] [address] [place],  [place] [address]  TSH     Status: Abnormal   Collection Time: 10/11/22 10:56 PM  Result Value Ref Range   TSH 56.362 (H) 0.350 - 4.500 uIU/mL    Comment: Performed by a 3rd Generation assay with a functional sensitivity of <=0.01 uIU/mL. Performed at [facility], [place] [address], [place], [place] [address]   Lactic acid, plasma     Status: None   Collection Time: 10/12/22  3:30 AM  Result Value Ref Range   Lactic Acid, Venous 0.7 0.5 - 1.9 mmol/L    Comment: Performed at [facility] [address] [place], [place] [address]  CBC     Status: Abnormal   Collection Time: 10/12/22  3:30 AM  Result Value Ref Range   WBC 3.6 (L) 4.0 - 10.5 K/uL   RBC 2.61 (L) 4.22 - 5.81 MIL/uL   Hemoglobin 8.2 (L) 13.0 - 17.0 g/dL   HCT 24.7 (L) 39.0 - 52.0 %   MCV 94.6 80.0 - 100.0 fL   MCH 31.4 26.0 - 34.0 pg   MCHC 33.2 30.0 - 36.0 g/dL   RDW 13.1 11.5 - 15.5 %   Platelets 160 150 - 400 K/uL   nRBC 0.0 0.0 - 0.2 %    Comment: Performed at [facility], [place] [address] [place], [place] [id number]  Basic metabolic panel     Status: Abnormal   Collection Time: 10/12/22  3:30 AM  Result Value Ref Range   Sodium 134 (L) 135 - 145 mmol/L   Potassium 3.4 (L) 3.5 - 5.1 mmol/L   Chloride 95 (L) 98 - 111 mmol/L   CO2  29 22 - 32 mmol/L   Glucose, Bld 144 (H) 70 - 99 mg/dL    Comment: Glucose reference range applies only to samples taken after fasting for at least 8 hours.   BUN 17 6 - 20 mg/dL   Creatinine, Ser 1.08 0.61 - 1.24 mg/dL   Calcium 6.7 (L) 8.9 - 10.3 mg/dL   GFR, Estimated >60 >60 mL/min    Comment: (NOTE) Calculated using the CKD-EPI Creatinine Equation (2021)    Anion gap 10 5 - 15    Comment: Performed at [facility] [address], [place], [place] [address]  Magnesium     Status: None   Collection Time: 10/12/22  3:30 AM  Result Value Ref Range   Magnesium 2.1 1.7 - 2.4 mg/dL    Comment: Performed at [facility] [address] [place], [place]  [address]   [place] RT UPPER EXTREM LTD SOFT TISSUE NON VASCULAR  Result Date: 10/11/2022 CLINICAL DATA:  Palpable nodule, rule out abscess EXAM: ULTRASOUND RIGHT UPPER EXTREMITY LIMITED TECHNIQUE: Ultrasound examination of the upper extremity soft tissues was performed in the area of clinical concern in the distal forearm. COMPARISON:  None Available. FINDINGS: Subcutaneous soft tissue edema. No appreciable fluid collection or abscess. IMPRESSION: Subcutaneous soft tissue edema. No appreciable fluid collection or abscess. Electronically Signed   By: Keane Police D.O.   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 EXAM: CT HEAD WITHOUT AND WITH CONTRAST TECHNIQUE: Contiguous axial images were obtained from the base of the skull through the vertex without and with intravenous contrast. RADIATION DOSE REDUCTION: This exam was performed according to the departmental dose-optimization program which includes automated exposure control, adjustment of the mA and/or kV according to patient size and/or use of iterative reconstruction technique. CONTRAST:  69m OMNIPAQUE IOHEXOL 350 MG/ML SOLN COMPARISON:  CT head 01/22/2022 FINDINGS: Brain: There is no acute intracranial hemorrhage, extra-axial fluid collection, or acute infarct. Parenchymal volume is normal. The ventricles are normal in size. Gray-white differentiation is preserved. There is no mass lesion or abnormal enhancement. The pituitary and suprasellar region are normal. There is no mass effect or midline shift. Vascular: No hyperdense vessel or unexpected calcification. Visible vessels are patent. Skull: Normal. Negative for fracture or focal lesion. Sinuses/Orbits: There is mild mucosal thickening in the paranasal sinuses. The globes and orbits are unremarkable. Other: None. IMPRESSION: No acute intracranial pathology or evidence of intracranial metastatic disease.  Electronically Signed   By: PValetta MoleM.D.   On: 10/11/2022 20:34   CT Soft Tissue Neck W Contrast  Result Date: 10/11/2022 CLINICAL DATA:  Face pain, chills, and vomiting for 3 days. History of head/neck cancer with total laryngectomy. EXAM: CT NECK WITH CONTRAST TECHNIQUE: Multidetector CT imaging of the neck was performed using the standard protocol following the bolus administration of intravenous contrast. RADIATION DOSE REDUCTION: This exam was performed according to the departmental dose-optimization program which includes automated exposure control, adjustment of the mA and/or kV according to patient size and/or use of iterative reconstruction technique. CONTRAST:  775mOMNIPAQUE IOHEXOL 350 MG/ML SOLN COMPARISON:  CT neck 01/26/2021 FINDINGS: Pharynx and larynx: The nasal cavity and nasopharynx are unremarkable. The parapharyngeal spaces are clear. The status post total laryngectomy, partial glossectomy, and partial pharyngectomy. There is no suspicious mucosal enhancement or solid mass lesion along the postsurgical aerodigestive tract. Soft tissue thickening in the  subcutaneous tissues of the neck anteriorly as well as posterolateral to the right internal jugular vein is overall similar to the prior study. Infiltrative soft tissue thickening surrounding the right internal carotid artery is unchanged (4-54). Diffuse subcutaneous edema is also overall similar. There is no new suspicious finding. A TEP device is in place. Salivary glands: The parotid glands are unremarkable. The right submandibular gland is surgically absent. The left submandibular gland is unremarkable. Thyroid: Surgically absent. Lymph nodes: The patient is status post bilateral neck dissection. There is no pathologic lymphadenopathy in the neck. Vascular: There is unchanged narrowing of the right internal jugular vein in the upper neck. The left internal jugular vein is occluded/surgically absent. The carotid arteries are patent.  Limited intracranial: Assessed on the separately dictated head CT. Visualized orbits: The globes and orbits are unremarkable. Mastoids and visualized paranasal sinuses: The mastoid air cells and middle ear cavities are clear. The paranasal sinuses are clear. Skeleton: There is no acute osseous abnormality or suspicious osseous lesion. Upper chest: There is debris in the trachea. There is patchy ground-glass opacity in the left upper lobe. Other: None. IMPRESSION: 1. Stable posttreatment appearance of the neck since 01/26/2022 with no new or suspicious finding. 2. Small amount of debris in the trachea. 3. Patchy ground-glass opacity in the left upper lobe favored infectious/inflammatory in etiology. Electronically Signed   By: Valetta Mole M.D.   On: 10/11/2022 20:23   DG Chest 2 View  Result Date: 10/11/2022 CLINICAL DATA:  Shortness of breath EXAM: CHEST - 2 VIEW COMPARISON:  CT chest abdomen and pelvis 04/17/2015 FINDINGS: There is air under the right hemidiaphragm, most likely within the colon. There is gaseous distention of the colon under the left hemidiaphragm as well. The lungs are clear. There is no pleural effusion or pneumothorax. There is a large air-fluid level in the stomach. The cardiomediastinal silhouette is within normal limits. No acute fractures are seen. IMPRESSION: 1. Gaseous distention of the colon with large air-fluid level in the stomach. Please correlate clinically. Consider dedicated abdominal imaging. 2. No acute cardiopulmonary process. Electronically Signed   By: Ronney Asters M.D.   On: 10/11/2022 19:39    Pending Labs Unresulted Labs (From admission, onward)     Start     Ordered   10/13/22 0500  HIV Antibody (routine testing w rflx)  (HIV Antibody (Routine testing w reflex) panel)  Tomorrow morning,   R        10/12/22 1320   10/12/22 1337  Ferritin  Add-on,   AD        10/12/22 1336   10/12/22 1336  Iron and TIBC  Add-on,   AD        10/12/22 1336   10/12/22 1312   Hepatic function panel  Add-on,   AD        10/12/22 1311   10/11/22 2240  HCV RNA quant  Once,   R        10/11/22 2239            Vitals/Pain Today's Vitals   10/12/22 1100 10/12/22 1115 10/12/22 1215 10/12/22 1327  BP: 97/70  104/65   Pulse:   69   Resp: 12 10 14   $ Temp:   98.2 F (36.8 C)   TempSrc:   Oral   SpO2:   100%   Weight:      Height:      PainSc:    5     Isolation Precautions No  active isolations  Medications Medications  enoxaparin (LOVENOX) injection 40 mg (40 mg Subcutaneous Given 10/12/22 1053)  acetaminophen (TYLENOL) tablet 650 mg (650 mg Oral Given 10/12/22 1224)  levothyroxine (SYNTHROID) tablet 137 mcg (has no administration in time range)  Ampicillin-Sulbactam (UNASYN) 3 g in sodium chloride 0.9 % 100 mL IVPB (has no administration in time range)  vancomycin (VANCOREADY) IVPB 1750 mg/350 mL (has no administration in time range)  sodium chloride 0.9 % bolus 1,000 mL (0 mLs Intravenous Stopped 10/11/22 2209)  morphine (PF) 4 MG/ML injection 4 mg (4 mg Intravenous Given 10/11/22 1924)  ondansetron (ZOFRAN) injection 4 mg (4 mg Intravenous Given 10/11/22 1924)  piperacillin-tazobactam (ZOSYN) IVPB 3.375 g (0 g Intravenous Stopped 10/11/22 2209)  iohexol (OMNIPAQUE) 350 MG/ML injection 75 mL (75 mLs Intravenous Contrast Given 10/11/22 1959)  acetaminophen (TYLENOL) tablet 650 mg (650 mg Oral Given 10/11/22 2227)    Mobility walks     Focused Assessments Pulmonary Assessment Handoff:  Lung sounds: L Breath Sounds: Inspiratory wheezes, Pleural rub R Breath Sounds: Inspiratory wheezes, Pleural rub O2 Device: Room Air      R Recommendations: See Admitting Provider Note  Report given to:

## 2022-10-12 NOTE — Procedures (Signed)
TRACHEOSTOMY TUBE PLACEMENT  After verbal consent was obtained, the patient's laryngectomy stoma was evaluated with a headlight and the obstructive mucous plug was debrided with saline and suction catheters. After complete removal of the mucous plug, a shiley 4-0 cuffless tube was placed and secured with tracheostomy tie (no soft collar available on the unit). The inner cannula was placed. The patient tolerated the procedure well  Tube placement: Shiley 4-0 Cuffless  Recommendations: - Primary team/ RT to order laryngectomy tubes prior to discharge - Patient instructed on importance of lary tube to prevent stomal stenosis as well as HME to reduce mucous plugging  Electronically signed by:  Jenetta Downer, MD  Staff Physician Facial Plastic & Reconstructive Surgery Otolaryngology - Head and [place], [place]

## 2022-10-12 NOTE — Progress Notes (Signed)
Patient placed on trach collar for humidification needs for stoma site. Stoma was suctioned with thick, dry secretions.

## 2022-10-12 NOTE — Procedures (Signed)
Tracheostomy Change Note  Patient Details:   Name: Gregory Snyder DOB: October 16, 1975 MRN: [id number]    Airway Documentation:     Evaluation  O2 sats: stable throughout Complications: No apparent complications Patient did tolerate procedure well. Bilateral Breath Sounds: Diminished  Patient initially had a lary tube which has been out since May according to the patient. Dr. Si Raider was called & made aware that the patient was complaining of shortness of breath & RT was unable to pass the suction catheter. ENT came to the bedside & removed several mucous plugs & successfully placed a #4 cuffless Shiley without any complications. Lurline Idol was secured with trach ties.     Claretta Fraise 10/12/2022, 6:13 PM

## 2022-10-12 NOTE — ED Notes (Signed)
Receiving RN informed this RN she was receiving 2 admissions and to stagger patient transport for patient safety needs and assessments.

## 2022-10-12 NOTE — Progress Notes (Addendum)
PROGRESS NOTE    Gregory Snyder  [id number] DOB: [birth year] DOA: 10/11/2022 PCP: System, Provider Not In     Brief Narrative:   From admission h and p   Gregory Snyder is a 47 y.o. 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    History limited as pt cannot vocalize with his tracheostomy stoma. Pt previous had tracheostomy tube but this was displaced in May of last year.  He says symptoms of increase right sided neck pain and erythema started today. Also has difficulty breathing. Denies trouble with swallowing. He has not followed up with ENT at [facility] for at least a year and has not taken any of his medications.   Assessment & Plan:   Principal Problem:   Aspiration pneumonia ([place]) Active Problems:   Cellulitis, neck   History of throat cancer   Hypothyroidism   Chronic hepatitis C (HCC)   Nodule of skin of right upper extremity  # Cellulitis Erythema and pain in skin surrounding tracheostomy site. Also a tender nodule right forearm - u/s of that site no fluid collection. Has a history of ludwig angina treated in 2023. CT of neck on arrival does not show signs of a deep space infection. - have requested ENT consult, they will see tomorrow - will check CT of chest to eval given opacity seen in left upper lobe on CT of neck - stop zosyn, switch abx to vanc and unasyn. Will check mrsa swab - monitor right forearm, may need I and D if develops into abscess - f/u HIV - f/u blood cultures, ngtd  # History tracheostomy Following radical neck dissection for SCC. Had followed with [facility] but lost to f/u, also lost trach tube some time ago  # Chronic hep c - f/u LFTs  # Hypothyroidism Post-surgical. Tsh elevated, has not bee on thyroid replacement for some time - re-start levothyroxine, would plan on prescribing at discharge  # Anemia,  normocytic Likely 2/2 chronic disease - f/u iron panel   DVT prophylaxis: lovenox Code Status: full Family Communication: none @ bedside  Level of care: Telemetry Medical Status is: Inpatient Remains inpatient appropriate because: severity of illness    Consultants:  ENT  Procedures: none  Antimicrobials:  Zosyn > unasyn/vanc    Subjective: Reports pain around trach site  Objective: Vitals:   10/12/22 1030 10/12/22 1100 10/12/22 1115 10/12/22 1215  BP: 92/68 97/70  104/65  Pulse:    69  Resp:  12 10 14  $ Temp:    98.2 F (36.8 C)  TempSrc:    Oral  SpO2:    100%  Weight:      Height:        Intake/Output Summary (Last 24 hours) at 10/12/2022 1329 Last data filed at 10/12/2022 0726 Gross per 24 hour  Intake 50 ml  Output --  Net 50 ml   Filed Weights   10/11/22 1745  Weight: 81.6 kg    Examination:  General exam: Appears calm and comfortable  Respiratory system: CTAB Cardiovascular system: S1 & S2 heard, RRR. No JVD, murmurs, rubs, gallops or clicks. No pedal edema. Gastrointestinal system: Abdomen is nondistended, soft and nontender. No organomegaly or masses felt. Normal bowel sounds heard. Central nervous system: Alert and oriented. No focal neurological deficits. Extremities: Symmetric 5 x 5 power. Skin: erythema and tenderness surrounding trach site. Also tender nodule right forearm Psychiatry:  calm    Data Reviewed: I have personally reviewed following labs and imaging studies  CBC: Recent Labs  Lab 10/11/22 1800 10/11/22 1812 10/12/22 0330  WBC 5.4  --  3.6*  NEUTROABS 4.7  --   --   HGB 9.2* 9.5* 8.2*  HCT 28.2* 28.0* 24.7*  MCV 95.3  --  94.6  PLT 169  --  0000000   Basic Metabolic Panel: Recent Labs  Lab 10/11/22 1800 10/11/22 1812 10/12/22 0330  NA 133* 134* 134*  K 3.7 3.8 3.4*  CL 91* 90* 95*  CO2 32  --  29  GLUCOSE 107* 111* 144*  BUN 19 22* 17  CREATININE 1.09 1.10 1.08  CALCIUM 7.5*  --  6.7*  MG  --   --  2.1    GFR: Estimated Creatinine Clearance: 85.5 mL/min (by C-G formula based on SCr of 1.08 mg/dL). Liver Function Tests: No results for input(s): "AST", "ALT", "ALKPHOS", "BILITOT", "PROT", "ALBUMIN" in the last 168 hours. No results for input(s): "LIPASE", "AMYLASE" in the last 168 hours. No results for input(s): "AMMONIA" in the last 168 hours. Coagulation Profile: No results for input(s): "INR", "PROTIME" in the last 168 hours. Cardiac Enzymes: No results for input(s): "CKTOTAL", "CKMB", "CKMBINDEX", "TROPONINI" in the last 168 hours. BNP (last 3 results) No results for input(s): "PROBNP" in the last 8760 hours. HbA1C: No results for input(s): "HGBA1C" in the last 72 hours. CBG: No results for input(s): "GLUCAP" in the last 168 hours. Lipid Profile: No results for input(s): "CHOL", "HDL", "LDLCALC", "TRIG", "CHOLHDL", "LDLDIRECT" in the last 72 hours. Thyroid Function Tests: Recent Labs    10/11/22 2256  TSH 56.362*   Anemia Panel: No results for input(s): "VITAMINB12", "FOLATE", "FERRITIN", "TIBC", "IRON", "RETICCTPCT" in the last 72 hours. Urine analysis:    Component Value Date/Time   COLORURINE YELLOW 05/07/2008 1013   APPEARANCEUR CLEAR 05/07/2008 1013   LABSPEC 1.004 (L) 05/07/2008 1013   PHURINE 6.0 05/07/2008 1013   GLUCOSEU NEGATIVE 05/07/2008 1013   HGBUR NEGATIVE 05/07/2008 1013   BILIRUBINUR NEGATIVE 05/07/2008 1013   KETONESUR NEGATIVE 05/07/2008 1013   PROTEINUR NEGATIVE 05/07/2008 1013   UROBILINOGEN 0.2 05/07/2008 1013   NITRITE NEGATIVE 05/07/2008 1013   LEUKOCYTESUR  05/07/2008 1013    NEGATIVE MICROSCOPIC NOT DONE ON URINES WITH NEGATIVE PROTEIN, BLOOD, LEUKOCYTES, NITRITE, OR GLUCOSE <1000 mg/dL.   Sepsis Labs: @LABRCNTIP$ (procalcitonin:4,lacticidven:4)  ) Recent Results (from the past 240 hour(s))  Blood culture (routine x 2)     Status: None (Preliminary result)   Collection Time: 10/11/22  9:06 PM   Specimen: BLOOD  Result Value Ref Range  Status   Specimen Description BLOOD BLOOD RIGHT HAND  Final   Special Requests   Final    BOTTLES DRAWN AEROBIC AND ANAEROBIC Blood Culture adequate volume   Culture   Final    NO GROWTH < 12 HOURS Performed at [facility], [place] [address], [place], [place] [address]    Report Status PENDING  Incomplete  Resp panel by RT-PCR (RSV, Flu A&B, Covid)     Status: None   Collection Time: 10/11/22  9:06 PM   Specimen: Nasal Swab  Result Value Ref Range Status   SARS Coronavirus 2 by RT PCR NEGATIVE NEGATIVE Final   Influenza A by PCR NEGATIVE NEGATIVE Final   Influenza B by PCR NEGATIVE NEGATIVE Final    Comment: (NOTE) The Xpert Xpress SARS-CoV-2/FLU/RSV plus assay is intended as an aid in the diagnosis of influenza  from Nasopharyngeal swab specimens and should not be used as a sole basis for treatment. Nasal washings and aspirates are unacceptable for Xpert Xpress SARS-CoV-2/FLU/RSV testing.  Fact Sheet for Patients: EntrepreneurPulse.com.au  Fact Sheet for Healthcare Providers: IncredibleEmployment.be  This test is not yet approved or cleared by the [place] FDA and has been authorized for detection and/or diagnosis of SARS-CoV-2 by FDA under an Emergency Use Authorization (EUA). This EUA will remain in effect (meaning this test can be used) for the duration of the COVID-19 declaration under Section 564(b)(1) of the Act, 21 U.S.C. section 360bbb-3(b)(1), unless the authorization is terminated or revoked.     Resp Syncytial Virus by PCR NEGATIVE NEGATIVE Final    Comment: (NOTE) Fact Sheet for Patients: EntrepreneurPulse.com.au  Fact Sheet for Healthcare Providers: IncredibleEmployment.be  This test is not yet approved or cleared by the [place] FDA and has been authorized for detection and/or diagnosis of SARS-CoV-2 by FDA under an Emergency Use Authorization (EUA). This EUA will  remain in effect (meaning this test can be used) for the duration of the COVID-19 declaration under Section 564(b)(1) of the Act, 21 U.S.C. section 360bbb-3(b)(1), unless the authorization is terminated or revoked.  Performed at [facility], [place] [address] [place], [place] [address]   Blood culture (routine x 2)     Status: None (Preliminary result)   Collection Time: 10/11/22  9:11 PM   Specimen: BLOOD  Result Value Ref Range Status   Specimen Description BLOOD BLOOD LEFT HAND  Final   Special Requests   Final    BOTTLES DRAWN AEROBIC AND ANAEROBIC Blood Culture adequate volume   Culture   Final    NO GROWTH < 12 HOURS Performed at [facility], [place] [address] [place], [place] [address]    Report Status PENDING  Incomplete         Radiology Studies: [place] RT UPPER EXTREM LTD SOFT TISSUE NON VASCULAR  Result Date: 10/11/2022 CLINICAL DATA:  Palpable nodule, rule out abscess EXAM: ULTRASOUND RIGHT UPPER EXTREMITY LIMITED TECHNIQUE: Ultrasound examination of the upper extremity soft tissues was performed in the area of clinical concern in the distal forearm. COMPARISON:  None Available. FINDINGS: Subcutaneous soft tissue edema. No appreciable fluid collection or abscess. IMPRESSION: Subcutaneous soft tissue edema. No appreciable fluid collection or abscess. Electronically Signed   By: Keane Police D.O.   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 EXAM: CT HEAD WITHOUT AND WITH CONTRAST TECHNIQUE: Contiguous axial images were obtained from the base of the skull through the vertex without and with intravenous contrast. RADIATION DOSE REDUCTION: This exam was performed according to the departmental dose-optimization program which includes automated exposure control, adjustment of the mA and/or kV according to patient size and/or use of iterative reconstruction technique.  CONTRAST:  40m OMNIPAQUE IOHEXOL 350 MG/ML SOLN COMPARISON:  CT head 01/22/2022 FINDINGS: Brain: There is no acute intracranial hemorrhage, extra-axial fluid collection, or acute infarct. Parenchymal volume is normal. The ventricles are normal in size. Gray-white differentiation is preserved. There is no mass lesion or abnormal enhancement. The pituitary and suprasellar region are normal. There is no mass effect or midline shift. Vascular: No hyperdense vessel or unexpected calcification. Visible vessels are patent. Skull: Normal. Negative for fracture or focal lesion. Sinuses/Orbits: There is mild mucosal thickening in the paranasal sinuses. The globes and orbits are unremarkable. Other: None. IMPRESSION:  No acute intracranial pathology or evidence of intracranial metastatic disease. Electronically Signed   By: Valetta Mole M.D.   On: 10/11/2022 20:34   CT Soft Tissue Neck W Contrast  Result Date: 10/11/2022 CLINICAL DATA:  Face pain, chills, and vomiting for 3 days. History of head/neck cancer with total laryngectomy. EXAM: CT NECK WITH CONTRAST TECHNIQUE: Multidetector CT imaging of the neck was performed using the standard protocol following the bolus administration of intravenous contrast. RADIATION DOSE REDUCTION: This exam was performed according to the departmental dose-optimization program which includes automated exposure control, adjustment of the mA and/or kV according to patient size and/or use of iterative reconstruction technique. CONTRAST:  46m OMNIPAQUE IOHEXOL 350 MG/ML SOLN COMPARISON:  CT neck 01/26/2021 FINDINGS: Pharynx and larynx: The nasal cavity and nasopharynx are unremarkable. The parapharyngeal spaces are clear. The status post total laryngectomy, partial glossectomy, and partial pharyngectomy. There is no suspicious mucosal enhancement or solid mass lesion along the postsurgical aerodigestive tract. Soft tissue thickening in the subcutaneous tissues of the neck anteriorly as well  as posterolateral to the right internal jugular vein is overall similar to the prior study. Infiltrative soft tissue thickening surrounding the right internal carotid artery is unchanged (4-54). Diffuse subcutaneous edema is also overall similar. There is no new suspicious finding. A TEP device is in place. Salivary glands: The parotid glands are unremarkable. The right submandibular gland is surgically absent. The left submandibular gland is unremarkable. Thyroid: Surgically absent. Lymph nodes: The patient is status post bilateral neck dissection. There is no pathologic lymphadenopathy in the neck. Vascular: There is unchanged narrowing of the right internal jugular vein in the upper neck. The left internal jugular vein is occluded/surgically absent. The carotid arteries are patent. Limited intracranial: Assessed on the separately dictated head CT. Visualized orbits: The globes and orbits are unremarkable. Mastoids and visualized paranasal sinuses: The mastoid air cells and middle ear cavities are clear. The paranasal sinuses are clear. Skeleton: There is no acute osseous abnormality or suspicious osseous lesion. Upper chest: There is debris in the trachea. There is patchy ground-glass opacity in the left upper lobe. Other: None. IMPRESSION: 1. Stable posttreatment appearance of the neck since 01/26/2022 with no new or suspicious finding. 2. Small amount of debris in the trachea. 3. Patchy ground-glass opacity in the left upper lobe favored infectious/inflammatory in etiology. Electronically Signed   By: PValetta MoleM.D.   On: 10/11/2022 20:23   DG Chest 2 View  Result Date: 10/11/2022 CLINICAL DATA:  Shortness of breath EXAM: CHEST - 2 VIEW COMPARISON:  CT chest abdomen and pelvis 04/17/2015 FINDINGS: There is air under the right hemidiaphragm, most likely within the colon. There is gaseous distention of the colon under the left hemidiaphragm as well. The lungs are clear. There is no pleural effusion or  pneumothorax. There is a large air-fluid level in the stomach. The cardiomediastinal silhouette is within normal limits. No acute fractures are seen. IMPRESSION: 1. Gaseous distention of the colon with large air-fluid level in the stomach. Please correlate clinically. Consider dedicated abdominal imaging. 2. No acute cardiopulmonary process. Electronically Signed   By: ARonney AstersM.D.   On: 10/11/2022 19:39        Scheduled Meds:  enoxaparin (LOVENOX) injection  40 mg Subcutaneous Q24H   Continuous Infusions:   LOS: 1 day     NDesma Maxim MD [facility]   If 7PM-7AM, please contact night-coverage [email or web address] Password [email or web address]2/13/2024, 1:29 PM

## 2022-10-12 NOTE — Plan of Care (Signed)
  Problem: Education: Goal: Knowledge about tracheostomy care/management will improve Outcome: Progressing   Problem: Activity: Goal: Ability to tolerate increased activity will improve Outcome: Progressing   Problem: Health Behavior/Discharge Planning: Goal: Ability to manage tracheostomy will improve Outcome: Progressing   Problem: Respiratory: Goal: Patent airway maintenance will improve Outcome: Progressing   Problem: Role Relationship: Goal: Ability to communicate will improve Outcome: Progressing

## 2022-10-13 ENCOUNTER — Inpatient Hospital Stay (HOSPITAL_COMMUNITY): Payer: Medicaid Other

## 2022-10-13 DIAGNOSIS — J690 Pneumonitis due to inhalation of food and vomit: Secondary | ICD-10-CM | POA: Diagnosis not present

## 2022-10-13 LAB — CBC
HCT: 25 % — ABNORMAL LOW (ref 39.0–52.0)
Hemoglobin: 8.1 g/dL — ABNORMAL LOW (ref 13.0–17.0)
MCH: 30.5 pg (ref 26.0–34.0)
MCHC: 32.4 g/dL (ref 30.0–36.0)
MCV: 94 fL (ref 80.0–100.0)
Platelets: 141 10*3/uL — ABNORMAL LOW (ref 150–400)
RBC: 2.66 MIL/uL — ABNORMAL LOW (ref 4.22–5.81)
RDW: 13 % (ref 11.5–15.5)
WBC: 3.6 10*3/uL — ABNORMAL LOW (ref 4.0–10.5)
nRBC: 0 % (ref 0.0–0.2)

## 2022-10-13 LAB — BASIC METABOLIC PANEL WITH GFR
Anion gap: 12 (ref 5–15)
BUN: 12 mg/dL (ref 6–20)
CO2: 28 mmol/L (ref 22–32)
Calcium: 7.2 mg/dL — ABNORMAL LOW (ref 8.9–10.3)
Chloride: 92 mmol/L — ABNORMAL LOW (ref 98–111)
Creatinine, Ser: 0.95 mg/dL (ref 0.61–1.24)
GFR, Estimated: >60 mL/min
Glucose, Bld: 179 mg/dL — ABNORMAL HIGH (ref 70–99)
Potassium: 3.6 mmol/L (ref 3.5–5.1)
Sodium: 132 mmol/L — ABNORMAL LOW (ref 135–145)

## 2022-10-13 LAB — MRSA NEXT GEN BY PCR, NASAL: MRSA by PCR Next Gen: DETECTED — AB

## 2022-10-13 LAB — HIV ANTIBODY (ROUTINE TESTING W REFLEX): HIV Screen 4th Generation wRfx: NONREACTIVE

## 2022-10-13 LAB — HCV RNA QUANT
HCV Quantitative Log: 6.267 log10 IU/mL (ref >1.70)
HCV Quantitative: 1850000 IU/mL (ref >50)

## 2022-10-13 LAB — GLUCOSE, CAPILLARY: Glucose-Capillary: 245 mg/dL — ABNORMAL HIGH (ref 70–99)

## 2022-10-13 LAB — VITAMIN B12: Vitamin B-12: 300 pg/mL (ref 180–914)

## 2022-10-13 MED ORDER — CHLORHEXIDINE GLUCONATE CLOTH 2 % EX PADS
6.0000 | MEDICATED_PAD | Freq: Every day | CUTANEOUS | Status: AC
  Administered 2022-10-13 – 2022-10-17 (×3): 6 via TOPICAL

## 2022-10-13 MED ORDER — VITAMIN B-12 100 MCG PO TABS
100.0000 ug | ORAL_TABLET | Freq: Every day | ORAL | Status: DC
  Administered 2022-10-13 – 2022-10-17 (×5): 100 ug via ORAL
  Filled 2022-10-13 (×5): qty 1

## 2022-10-13 MED ORDER — MUPIROCIN 2 % EX OINT
1.0000 | TOPICAL_OINTMENT | Freq: Two times a day (BID) | CUTANEOUS | Status: DC
  Administered 2022-10-13 – 2022-10-17 (×7): 1 via NASAL
  Filled 2022-10-13: qty 22

## 2022-10-13 MED ORDER — OXYCODONE HCL 5 MG PO TABS
5.0000 mg | ORAL_TABLET | Freq: Four times a day (QID) | ORAL | Status: DC | PRN
  Administered 2022-10-13 – 2022-10-22 (×23): 5 mg via ORAL
  Filled 2022-10-13 (×24): qty 1

## 2022-10-13 MED ORDER — LACTATED RINGERS IV SOLN: INTRAVENOUS | Status: DC

## 2022-10-13 NOTE — TOC Initial Note (Signed)
Transition of Care [facility]) - Initial/Assessment Note    Patient Details  Name: Gregory Snyder MRN: [id number] Date of Birth: 12/05/1975  [facility] CM/SW Contact:    Tom-Johnson, Renea Ee, RN Phone Number: 10/13/2022, 3:26 PM  Clinical Narrative:                  CM spoke with patient at bedside about needs for post hospital transition. Admitted for Aspiration Pneumonia, on IV abx. Has hx of Squamous Cell Carcinoma (SCC) of the Head and Neck s/p radial neck dissection, Laryngectomy, total Thyroidectomy and Tracheostomy. Patient was admitted with just Laryngectomy stoma and re-trached with Shiley 4-0 Cuffless at bedside yesterday 10/12/22. Speech following.  Patient states he was living with a friend until 2 months ago when he became homeless. States he does not have a supportive family. Has been living off [facility] since.  Patient requests bus passes to and from outpatient appointment at [facility], will be given at discharge once disposition is arranged.  Awaiting PT/OT eval for disposition. CM will continue to follow as patient progresses towards discharge.             Barriers to Discharge: Continued Medical Work up, Homeless with medical needs   Patient Goals and CMS Choice Patient states their goals for this hospitalization and ongoing recovery are:: To find a stable home CMS Medicare.gov Compare Post Acute Care list provided to:: Patient Choice offered to / list presented to : Patient      Expected Discharge Plan and Services In-house Referral: Clinical Social Work, PCP / Psychologist, educational Discharge Planning Services: CM Consult, Medication Assistance   Living arrangements for the past 2 months: Homeless                                      Prior Living Arrangements/Services Living arrangements for the past 2 months: Homeless Lives with:: Self Patient language and need for interpreter reviewed:: Yes Do you feel safe going back to  the place where you live?: Yes      Need for Family Participation in Patient Care: Yes (Comment) Care giver support system in place?: Yes (comment)   Criminal Activity/Legal Involvement Pertinent to Current Situation/Hospitalization: No - Comment as needed  Activities of Daily Living Home Assistive Devices/Equipment: None ADL Screening (condition at time of admission) Patient's cognitive ability adequate to safely complete daily activities?: Yes Is the patient deaf or have difficulty hearing?: No Does the patient have difficulty seeing, even when wearing glasses/contacts?: No Does the patient have difficulty concentrating, remembering, or making decisions?: No Patient able to express need for assistance with ADLs?: No Does the patient have difficulty dressing or bathing?: No Independently performs ADLs?: Yes (appropriate for developmental age) Does the patient have difficulty walking or climbing stairs?: No Weakness of Legs: Both Weakness of Arms/Hands: None  Permission Sought/Granted Permission sought to share information with : Case Manager, Customer service manager, Family Supports Permission granted to share information with : Yes, Verbal Permission Granted              Emotional Assessment Appearance:: Appears stated age Attitude/Demeanor/Rapport: Engaged, Gracious Affect (typically observed): Accepting, Appropriate, Calm, Hopeful, Pleasant Orientation: : Oriented to Self, Oriented to Place, Oriented to  Time, Oriented to Situation Alcohol / Substance Use: Alcohol Use Psych Involvement: No (comment)  Admission diagnosis:  Aspiration pneumonia ([place]) [J69.0] Tracheitis [J04.10] Aspiration pneumonia of left  upper lobe, unspecified aspiration pneumonia type [facility]) [J69.0] Patient Active Problem List   Diagnosis Date Noted   Aspiration pneumonia ([place]) 10/11/2022   Cellulitis, neck 10/11/2022   History of throat cancer 10/11/2022   Hypothyroidism 10/11/2022   Chronic  hepatitis C ([place]) 10/11/2022   Nodule of skin of right upper extremity 10/11/2022   Peripheral neuropathy 01/22/2022   Aspiration pneumonitis ([place])    Edema    Cellulitis 01/13/2022   Ludwig's angina    Throat cancer ([place])    Tongue swelling    PCP:  System, Provider Not In Pharmacy:   [place] [address] [place] [address] Phone: [phone] Fax: [phone]     Social Determinants of Health (SDOH) Social History: [place]: Low Risk  (10/12/2022)  Tobacco Use: Medium Risk (10/11/2022)   SDOH Interventions:     Readmission Risk Interventions     No data to display

## 2022-10-13 NOTE — Progress Notes (Signed)
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  He has not been using a tracheostomy tube since May last year.  He presented with increased right-sided neck pain and erythema that started the day of admission.  He has had difficulty breathing.   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  A small amount of debris within the trachea.  Patchy groundglass opacity in the left upper lobe favoring infectious or inflammatory in etiology. -Evaluated by ENT 12/13: Recommend continued to use of Shiley 4-0 cuffless stomach tube until laryngectomy tubes are ordered and secure. -Needs to follow-up with [facility] neck [facility] for routine cancer surveillance should be every 3 to 6 months.  Also need reassessed tracheo-esophageal prosthesis to consider replacement or removal and closure given recurrent aspiration pneumonia -S/p bedside debridement and tracheostomy tube placement by Dr. Jenetta Downer.  -CT chest: Mild bilateral posterior basilar subsegmental atelectasis.  Multiple small patchy airspace opacities are noted in the left upper lobe concerning for multifocal pneumonia.  Status post laryngectomy. -Discussed with Dr Sabino Gasser, follow up at [facility] to  reassessed tracheo-esophageal prosthesis to consider replacement or removal and closure given  recurrent aspiration pneumonia, Speech consult for Diet.  -Speech Therapist consulted: Evaluated patient and recommend Regular thin liquid.  Orders in for tach supply. Speech assisting as well.  -Blood culture. No growth to date.  -IV Vancomycin and Unasyn.   Chronic hepatitis C Needs follow up Out patient.   Hypothyroidism:  TSH; 56. Restarted Synthroid.   Anemia, Normocytic. Iron deficiency anemia  Iron low 36, ferritin 146.  Start supplement when infection is controlled.   Gaseous distention of the colon with large air-fluid in the stomach on chest x ray .  check KUB  Mild Hyponatremia Pancytopenia; secondary to infection. Monitor.  Start B 12 supplement.    Estimated body mass index is 26.57 kg/m as calculated from the following:   Height as of this encounter: 5' 9"$  (1.753 m).   Weight as of this encounter: 81.6 kg.   DVT prophylaxis: Lovenox Code Status: Full code Family Communication: care discussed with patient  Disposition Plan:  Status is: Inpatient Remains inpatient appropriate because: management of aspiration PNA> Needs PCP, PT, OT eval, Homeless.     Consultants:  ENT Dr Sabino Gasser.   Procedures:  -S/p bedside debridement and tracheostomy tube placement by Dr. Jenetta Downer.  Antimicrobials:  IV Vancomycin and Unasyn   Subjective: He is alert, communicate by writing on paper.  He doenst have PCP.  Asking for pain medications.  We discussed about importance of following with [facility] for further care and evaluation of  tracheo-esophageal prosthesis He needs assistance with transportation for doctors appointment.   Objective: Vitals:   10/12/22 1809 10/12/22 2017 10/12/22 2157 10/13/22 0601  BP:   111/85 97/64  Pulse: (!) 26  (!) 58 66  Resp:   18 20  Temp:  98 F (36.7 C) 97.8 F (36.6 C)  TempSrc:   Oral Oral  SpO2: 100% 99% 100% 98%  Weight:      Height:        Intake/Output Summary (Last 24 hours) at 10/13/2022 0755 Last data filed at  10/13/2022 0200 Gross per 24 hour  Intake 965.49 ml  Output 1600 ml  Net -634.51 ml   Filed Weights   10/11/22 1745  Weight: 81.6 kg    Examination:  General exam: Appears calm and comfortable  Respiratory system:  Respiratory effort normal. Trach in place.  Cardiovascular system: S1 & S2 heard, RRR.  Gastrointestinal system: Abdomen is nondistended, soft and nontender. No organomegaly or masses felt. Normal bowel sounds heard. Central nervous system: Alert and oriented. No focal neurological deficits. Extremities: Symmetric 5 x 5 power.   Data Reviewed: I have personally reviewed following labs and imaging studies  CBC: Recent Labs  Lab 10/11/22 1800 10/11/22 1812 10/12/22 0330 10/13/22 0100  WBC 5.4  --  3.6* 3.6*  NEUTROABS 4.7  --   --   --   HGB 9.2* 9.5* 8.2* 8.1*  HCT 28.2* 28.0* 24.7* 25.0*  MCV 95.3  --  94.6 94.0  PLT 169  --  160 [id number]*   Basic Metabolic Panel: Recent Labs  Lab 10/11/22 1800 10/11/22 1812 10/12/22 0330 10/13/22 0100  NA 133* 134* 134* 132*  K 3.7 3.8 3.4* 3.6  CL 91* 90* 95* 92*  CO2 32  --  29 28  GLUCOSE 107* 111* 144* 179*  BUN 19 22* 17 12  CREATININE 1.09 1.10 1.08 0.95  CALCIUM 7.5*  --  6.7* 7.2*  MG  --   --  2.1  --    GFR: Estimated Creatinine Clearance: 97.2 mL/min (by C-G formula based on SCr of 0.95 mg/dL). Liver Function Tests: Recent Labs  Lab 10/12/22 1734  AST 73*  ALT 40  ALKPHOS 55  BILITOT 0.3  PROT 6.7  ALBUMIN 3.0*   No results for input(s): "LIPASE", "AMYLASE" in the last 168 hours. No results for input(s): "AMMONIA" in the last 168 hours. Coagulation Profile: No results for input(s): "INR", "PROTIME" in the last 168 hours. Cardiac Enzymes: No results for input(s): "CKTOTAL", "CKMB", "CKMBINDEX", "TROPONINI" in the last 168 hours. BNP (last 3 results) No results for input(s): "PROBNP" in the last 8760 hours. HbA1C: No results for input(s): "HGBA1C" in the last 72 hours. CBG: No results for  input(s): "GLUCAP" in the last 168 hours. Lipid Profile: No results for input(s): "CHOL", "HDL", "LDLCALC", "TRIG", "CHOLHDL", "LDLDIRECT" in the last 72 hours. Thyroid Function Tests: Recent Labs    10/11/22 2256  TSH 56.362*   Anemia Panel: Recent Labs    10/12/22 1734  FERRITIN 146  TIBC 329  IRON 36*   Sepsis Labs: Recent Labs  Lab 10/11/22 2256 10/12/22 0330  LATICACIDVEN 0.6 0.7    Recent Results (from the past 240 hour(s))  Blood culture (routine x 2)     Status: None (Preliminary result)   Collection Time: 10/11/22  9:06 PM   Specimen: BLOOD  Result Value Ref Range Status   Specimen Description BLOOD BLOOD RIGHT HAND  Final   Special Requests   Final    BOTTLES DRAWN AEROBIC AND ANAEROBIC Blood Culture adequate volume   Culture   Final    NO GROWTH < 12 HOURS Performed at [facility], [place] [address], [place], [place] [address]    Report Status PENDING  Incomplete  Resp panel by RT-PCR (RSV, Flu A&B, Covid)     Status: None   Collection Time: 10/11/22  9:06 PM   Specimen: Nasal Swab  Result Value Ref Range Status   SARS Coronavirus 2 by RT PCR NEGATIVE NEGATIVE Final   Influenza A by PCR NEGATIVE NEGATIVE Final   Influenza B by PCR NEGATIVE NEGATIVE Final    Comment: (NOTE) The Xpert Xpress SARS-CoV-2/FLU/RSV plus assay is intended as an aid in the diagnosis of influenza from Nasopharyngeal swab specimens and should not be used as a sole basis for treatment. Nasal washings and aspirates are unacceptable for Xpert Xpress SARS-CoV-2/FLU/RSV testing.  Fact Sheet for Patients: EntrepreneurPulse.com.au  Fact Sheet for Healthcare Providers: IncredibleEmployment.be  This test is not yet approved or cleared by the [place] FDA and has been authorized for detection and/or diagnosis of SARS-CoV-2 by FDA under an Emergency Use Authorization (EUA). This EUA will remain in effect (meaning this test can be used) for  the duration of the COVID-19 declaration under Section 564(b)(1) of the Act, 21 U.S.C. section 360bbb-3(b)(1), unless the authorization is terminated or revoked.     Resp Syncytial Virus by PCR NEGATIVE NEGATIVE Final    Comment: (NOTE) Fact Sheet for Patients: EntrepreneurPulse.com.au  Fact Sheet for Healthcare Providers: IncredibleEmployment.be  This test is not yet approved or cleared by the [place] FDA and has been authorized for detection and/or diagnosis of SARS-CoV-2 by FDA under an Emergency Use Authorization (EUA). This EUA will remain in effect (meaning this test can be used) for the duration of the COVID-19 declaration under Section 564(b)(1) of the Act, 21 U.S.C. section 360bbb-3(b)(1), unless the authorization is terminated or revoked.  Performed at [facility], [place] [address] [place], [place] [address]   Blood culture (routine x 2)     Status: None (Preliminary result)   Collection Time: 10/11/22  9:11 PM   Specimen: BLOOD  Result Value Ref Range Status   Specimen Description BLOOD BLOOD LEFT HAND  Final   Special Requests   Final    BOTTLES DRAWN AEROBIC AND ANAEROBIC Blood Culture adequate volume   Culture   Final    NO GROWTH < 12 HOURS Performed at [facility], [place] [address] [place], [place] [address]    Report Status PENDING  Incomplete  MRSA Next Gen by PCR, Nasal     Status: Abnormal   Collection Time: 10/13/22  5:16 AM   Specimen: Nasal Mucosa; Nasal Swab  Result Value Ref Range Status   MRSA by PCR Next Gen DETECTED (A) NOT DETECTED Final    Comment: RESULT CALLED TO, READ BACK BY AND VERIFIED WITH: RN A BARLOW [id number] AT 20 AM BY CM (NOTE) The GeneXpert MRSA Assay (FDA approved for NASAL specimens only), is one component of a comprehensive MRSA colonization surveillance program. It is not intended to diagnose MRSA infection nor to guide or monitor treatment for MRSA infections. Test  performance is not FDA approved in patients less than 10 years old. Performed at [facility], [place] [address] [place], [place] [address]          Radiology Studies: CT CHEST WO CONTRAST  Result Date: 10/12/2022 CLINICAL DATA:  Shortness of breath. EXAM: CT CHEST WITHOUT CONTRAST TECHNIQUE: Multidetector CT imaging of the chest was performed following the standard protocol without IV contrast. RADIATION DOSE REDUCTION: This exam was performed according to the departmental dose-optimization program which includes automated exposure control, adjustment of  the mA and/or kV according to patient size and/or use of iterative reconstruction technique. COMPARISON:  Radiograph of October 11, 2022. CT scan of April 17, 2015. FINDINGS: Cardiovascular: No significant vascular findings. Normal heart size. No pericardial effusion. Mediastinum/Nodes: Status post laryngectomy. No adenopathy is noted. The esophagus is unremarkable. Lungs/Pleura: No pneumothorax or pleural effusion is noted. Mild bilateral posterior basilar subsegmental atelectasis is noted. Multiple small patchy airspace opacities are noted in the left upper lobe concerning for multifocal pneumonia. Upper Abdomen: Nonobstructive left nephrolithiasis. Musculoskeletal: No chest wall mass or suspicious bone lesions identified. IMPRESSION: Mild bilateral posterior basilar subsegmental atelectasis. Multiple small patchy airspace opacities are noted in left upper lobe concerning for multifocal pneumonia. Status post laryngectomy. Nonobstructive left nephrolithiasis. Electronically Signed   By: Marijo Conception M.D.   On: 10/12/2022 14:48   [place] RT UPPER EXTREM LTD SOFT TISSUE NON VASCULAR  Result Date: 10/11/2022 CLINICAL DATA:  Palpable nodule, rule out abscess EXAM: ULTRASOUND RIGHT UPPER EXTREMITY LIMITED TECHNIQUE: Ultrasound examination of the upper extremity soft tissues was performed in the area of clinical concern in the distal forearm.  COMPARISON:  None Available. FINDINGS: Subcutaneous soft tissue edema. No appreciable fluid collection or abscess. IMPRESSION: Subcutaneous soft tissue edema. No appreciable fluid collection or abscess. Electronically Signed   By: Keane Police D.O.   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 EXAM: CT HEAD WITHOUT AND WITH CONTRAST TECHNIQUE: Contiguous axial images were obtained from the base of the skull through the vertex without and with intravenous contrast. RADIATION DOSE REDUCTION: This exam was performed according to the departmental dose-optimization program which includes automated exposure control, adjustment of the mA and/or kV according to patient size and/or use of iterative reconstruction technique. CONTRAST:  58m OMNIPAQUE IOHEXOL 350 MG/ML SOLN COMPARISON:  CT head 01/22/2022 FINDINGS: Brain: There is no acute intracranial hemorrhage, extra-axial fluid collection, or acute infarct. Parenchymal volume is normal. The ventricles are normal in size. Gray-white differentiation is preserved. There is no mass lesion or abnormal enhancement. The pituitary and suprasellar region are normal. There is no mass effect or midline shift. Vascular: No hyperdense vessel or unexpected calcification. Visible vessels are patent. Skull: Normal. Negative for fracture or focal lesion. Sinuses/Orbits: There is mild mucosal thickening in the paranasal sinuses. The globes and orbits are unremarkable. Other: None. IMPRESSION: No acute intracranial pathology or evidence of intracranial metastatic disease. Electronically Signed   By: PValetta MoleM.D.   On: 10/11/2022 20:34   CT Soft Tissue Neck W Contrast  Result Date: 10/11/2022 CLINICAL DATA:  Face pain, chills, and vomiting for 3 days. History of head/neck cancer with total laryngectomy. EXAM: CT NECK WITH CONTRAST TECHNIQUE: Multidetector CT imaging of the neck  was performed using the standard protocol following the bolus administration of intravenous contrast. RADIATION DOSE REDUCTION: This exam was performed according to the departmental dose-optimization program which includes automated exposure control, adjustment of the mA and/or kV according to patient size and/or use of iterative reconstruction technique. CONTRAST:  787mOMNIPAQUE IOHEXOL 350 MG/ML SOLN COMPARISON:  CT neck 01/26/2021 FINDINGS: Pharynx and larynx: The nasal cavity and nasopharynx are unremarkable. The parapharyngeal spaces are clear. The status post total laryngectomy, partial glossectomy, and partial pharyngectomy. There is no suspicious mucosal enhancement or solid mass lesion along the postsurgical aerodigestive tract. Soft tissue thickening in the subcutaneous tissues of the neck anteriorly as well as posterolateral  to the right internal jugular vein is overall similar to the prior study. Infiltrative soft tissue thickening surrounding the right internal carotid artery is unchanged (4-54). Diffuse subcutaneous edema is also overall similar. There is no new suspicious finding. A TEP device is in place. Salivary glands: The parotid glands are unremarkable. The right submandibular gland is surgically absent. The left submandibular gland is unremarkable. Thyroid: Surgically absent. Lymph nodes: The patient is status post bilateral neck dissection. There is no pathologic lymphadenopathy in the neck. Vascular: There is unchanged narrowing of the right internal jugular vein in the upper neck. The left internal jugular vein is occluded/surgically absent. The carotid arteries are patent. Limited intracranial: Assessed on the separately dictated head CT. Visualized orbits: The globes and orbits are unremarkable. Mastoids and visualized paranasal sinuses: The mastoid air cells and middle ear cavities are clear. The paranasal sinuses are clear. Skeleton: There is no acute osseous abnormality or suspicious  osseous lesion. Upper chest: There is debris in the trachea. There is patchy ground-glass opacity in the left upper lobe. Other: None. IMPRESSION: 1. Stable posttreatment appearance of the neck since 01/26/2022 with no new or suspicious finding. 2. Small amount of debris in the trachea. 3. Patchy ground-glass opacity in the left upper lobe favored infectious/inflammatory in etiology. Electronically Signed   By: Valetta Mole M.D.   On: 10/11/2022 20:23   DG Chest 2 View  Result Date: 10/11/2022 CLINICAL DATA:  Shortness of breath EXAM: CHEST - 2 VIEW COMPARISON:  CT chest abdomen and pelvis 04/17/2015 FINDINGS: There is air under the right hemidiaphragm, most likely within the colon. There is gaseous distention of the colon under the left hemidiaphragm as well. The lungs are clear. There is no pleural effusion or pneumothorax. There is a large air-fluid level in the stomach. The cardiomediastinal silhouette is within normal limits. No acute fractures are seen. IMPRESSION: 1. Gaseous distention of the colon with large air-fluid level in the stomach. Please correlate clinically. Consider dedicated abdominal imaging. 2. No acute cardiopulmonary process. Electronically Signed   By: Ronney Asters M.D.   On: 10/11/2022 19:39        Scheduled Meds:  enoxaparin (LOVENOX) injection  40 mg Subcutaneous Q24H   levothyroxine  137 mcg Oral QAC breakfast   Continuous Infusions:  ampicillin-sulbactam (UNASYN) IV 3 g (10/13/22 0215)   vancomycin 750 mg (10/13/22 0556)     LOS: 2 days    Time spent: 35 minutes.     Elmarie Shiley, MD [facility]   If 7PM-7AM, please contact night-coverage [email or web address]  10/13/2022, 7:55 AM

## 2022-10-13 NOTE — Evaluation (Signed)
Clinical/Bedside Swallow Evaluation Patient Details  Name: Gregory Snyder MRN: [id number] Date of Birth: 10/01/1975  Today's Date: 10/13/2022 Time: SLP Start Time (ACUTE ONLY): 22 SLP Stop Time (ACUTE ONLY): 1115 SLP Time Calculation (min) (ACUTE ONLY): 40 min  Past Medical History:  Past Medical History:  Diagnosis Date   Cancer [facility])    Past Surgical History:  Past Surgical History:  Procedure Laterality Date   TOTAL LARYNGECTOMY N/A 12/2020   HPI:  Patient is a 47 y.o. 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 He presented to the hospital on 10/11/22 with increase in neck swelling. He previously had TEP placed in May of 2023 but has not followed up with ENT at [facility] for at least a year and has not been taking any of his medications. ENT at [facility] evaluated patient and removed obstructive mucous plug from stoma, debrided with saline and placed a Shiley #4 cuffless tube to keep stoma open and recommended ordering laryngectomy tubes and HME's prior to discharge. There is concern that patient is having leaking from his TEP which could be leading to aspiration.    Assessment / Plan / Recommendation  Clinical Impression  Patient seen by SLP to assess swallow function at bedside. He does not have any previous or current complaints with his swallow function. Currently patient has Shiley #4 trach to keep stoma open, has TEP but has not had laryngectomy tubes or HME's for some time. There is some concern his TEP might be leaking. Patient reported via writing that after hospitalization May of 2023, he remained incarcerated until June. He then moved in with a friend but was "kicked out" after two months. He has since been homeless and living "on [address] He has not been able to follow up with his laryngectomy care at [facility] as he has no means of transportation. SLP observed patient with PO intake of thin liquids (Ensure shake)  which patient drank via straw sips. No overt s/s aspiration or penetration observed. SLP recommending to continue with regular texture solids, thin liquids. Plan for SLP to follow patient to ensure toleration, determine if need for objective swallow evaluation and to assist in ordering laryngectomy tubes and HME's for patient. SLP Visit Diagnosis: Dysphagia, unspecified (R13.10)    Aspiration Risk  No limitations;Mild aspiration risk    Diet Recommendation Regular;Thin liquid   Liquid Administration via: Cup;Straw Medication Administration: Whole meds with liquid Supervision: Patient able to self feed Compensations: Small sips/bites;Slow rate Postural Changes: Seated upright at 90 degrees    Other  Recommendations Oral Care Recommendations: Oral care BID    Recommendations for follow up therapy are one component of a multi-disciplinary discharge planning process, led by the attending physician.  Recommendations may be updated based on patient status, additional functional criteria and insurance authorization.  Follow up Recommendations Skilled nursing-short term rehab (<3 hours/day)      Assistance Recommended at Discharge    Functional Status Assessment Patient has had a recent decline in their functional status and demonstrates the ability to make significant improvements in function in a reasonable and predictable amount of time.  Frequency and Duration min 2x/week  2 weeks       Prognosis Prognosis for improved oropharyngeal function: Good      Swallow Study   General Date of Onset: 10/11/22 HPI: Patient is a 47 y.o. male with PMH: SCC of head and neck s/p radial neck dissection, laryngectomy, total thyroidectomy, TEP  prosthesis. He presented to the hospital on 10/11/22 with increase in neck swelling. He previously had TEP placed in May of 2023 but has not followed up with ENT at [facility] for at least a year and has not been taking any of his medications. ENT at [facility] evaluated  patient and removed obstructive mucous plug from stoma, debrided with saline and placed a Shiley #4 cuffless tube to keep stoma open and recommended ordering laryngectomy tubes and HME's prior to discharge. There is concern that patient is having leaking from his TEP which could be leading to aspiration. Type of Study: Bedside Swallow Evaluation Previous Swallow Assessment: during admission May 2023 Diet Prior to this Study: Regular Temperature Spikes Noted: No Respiratory Status: Other (comment) (stoma) History of Recent Intubation: No Behavior/Cognition: Alert;Cooperative;Pleasant mood Oral Cavity Assessment: Within Functional Limits Oral Care Completed by SLP: No Oral Cavity - Dentition: Poor condition Vision: Functional for self-feeding Self-Feeding Abilities: Able to feed self Baseline Vocal Quality: Aphonic Volitional Swallow: Able to elicit    Oral/Motor/Sensory Function Overall Oral Motor/Sensory Function: Mild impairment Facial ROM: Reduced right Facial Symmetry: Abnormal symmetry right Facial Sensation: Reduced right   Ice Chips     Thin Liquid Thin Liquid: Within functional limits Presentation: Straw;Self Fed    Nectar Thick     Honey Thick     Puree Puree: Not tested   Solid     Solid: Not tested      Sonia Baller, MA, CCC-SLP Speech Therapy

## 2022-10-14 DIAGNOSIS — J690 Pneumonitis due to inhalation of food and vomit: Secondary | ICD-10-CM | POA: Diagnosis not present

## 2022-10-14 DIAGNOSIS — E890 Postprocedural hypothyroidism: Secondary | ICD-10-CM | POA: Diagnosis not present

## 2022-10-14 DIAGNOSIS — Z85819 Personal history of malignant neoplasm of unspecified site of lip, oral cavity, and pharynx: Secondary | ICD-10-CM | POA: Diagnosis not present

## 2022-10-14 DIAGNOSIS — L03221 Cellulitis of neck: Secondary | ICD-10-CM | POA: Diagnosis not present

## 2022-10-14 LAB — CBC
HCT: 23.5 % — ABNORMAL LOW (ref 39.0–52.0)
Hemoglobin: 7.7 g/dL — ABNORMAL LOW (ref 13.0–17.0)
MCH: 30.9 pg (ref 26.0–34.0)
MCHC: 32.8 g/dL (ref 30.0–36.0)
MCV: 94.4 fL (ref 80.0–100.0)
Platelets: 159 10*3/uL (ref 150–400)
RBC: 2.49 MIL/uL — ABNORMAL LOW (ref 4.22–5.81)
RDW: 12.8 % (ref 11.5–15.5)
WBC: 5 10*3/uL (ref 4.0–10.5)
nRBC: 0 % (ref 0.0–0.2)

## 2022-10-14 LAB — BASIC METABOLIC PANEL
Anion gap: 11 (ref 5–15)
BUN: 9 mg/dL (ref 6–20)
CO2: 30 mmol/L (ref 22–32)
Calcium: 7.8 mg/dL — ABNORMAL LOW (ref 8.9–10.3)
Chloride: 97 mmol/L — ABNORMAL LOW (ref 98–111)
Creatinine, Ser: 0.85 mg/dL (ref 0.61–1.24)
GFR, Estimated: >60 mL/min (ref >60)
Glucose, Bld: 108 mg/dL — ABNORMAL HIGH (ref 70–99)
Potassium: 3.2 mmol/L — ABNORMAL LOW (ref 3.5–5.1)
Sodium: 138 mmol/L (ref 135–145)

## 2022-10-14 MED ORDER — LIDOCAINE 5 % EX PTCH
1.0000 | MEDICATED_PATCH | Freq: Every day | CUTANEOUS | Status: DC
  Administered 2022-10-14 – 2022-10-21 (×9): 1 via TRANSDERMAL
  Filled 2022-10-14 (×9): qty 1

## 2022-10-14 MED ORDER — POTASSIUM CHLORIDE 10 MEQ/100ML IV SOLN
10.0000 meq | INTRAVENOUS | Status: AC
  Administered 2022-10-14 (×4): 10 meq via INTRAVENOUS
  Filled 2022-10-14 (×4): qty 100

## 2022-10-14 MED ORDER — DOCUSATE SODIUM 100 MG PO CAPS
200.0000 mg | ORAL_CAPSULE | Freq: Once | ORAL | Status: AC
  Administered 2022-10-14: 200 mg via ORAL
  Filled 2022-10-14: qty 2

## 2022-10-14 MED ORDER — POLYETHYLENE GLYCOL 3350 17 G PO PACK
17.0000 g | PACK | Freq: Every day | ORAL | Status: DC
  Administered 2022-10-15 – 2022-10-20 (×5): 17 g via ORAL
  Filled 2022-10-14 (×7): qty 1

## 2022-10-14 NOTE — Hospital Course (Addendum)
Mr. Laudadio is a 48 year old male with PMH SCC of the head and neck s/p radical neck dissection, laryngectomy, total thyroidectomy, tracheostomy who presented with increased neck swelling. He has not been using a tracheostomy tube since May last year. He presented with increased neck pain and erythema that started the day of admission. He has had difficulty breathing.   Hx of SCC Cx of the head and neck with tracheostomy stoma here with aspiration pneumonia on room air and cellulitis of neck.  On discharge the patient will need referral to head and neck surgeon in [place] as travelling to [facility] is very difficult for him. [facility] apparently has one that would see him.

## 2022-10-14 NOTE — Progress Notes (Signed)
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 He has not been using a tracheostomy tube since May last year. He presented with increased  neck pain and erythema that started the day of admission. He has had difficulty breathing.   Hx of SCC Cx of the head and neck with tracheostomy stoma here with aspiration pneumonia on room air and cellulitis of right neck. His   Lidocaine patch has been ordered for his back pain. The patient is complaining of left sided facial/neck swelling and erythema. It was explained to him that this was due to cellulitis for which the patient is receiving vancomycin.  Consultants  ENT  Procedures  Tracheostomy tube was replaced and mucus plug removed 10/12/2022.  Antibiotics   Anti-infectives (From admission, onward)    Start     Dose/Rate Route Frequency Ordered Stop   10/13/22 0600  vancomycin (VANCOREADY) IVPB 750 mg/150 mL        750 mg 150 mL/hr over 60 Minutes Intravenous Every 8 hours 10/12/22 1409     10/12/22 1845  vancomycin (VANCOREADY) IVPB 1750 mg/350 mL        1,750 mg 175 mL/hr over 120 Minutes Intravenous  Once 10/12/22 1734 10/12/22 2321   10/12/22 1345  Ampicillin-Sulbactam (UNASYN) 3 g in sodium chloride 0.9 % 100 mL IVPB        3 g 200 mL/hr over 30 Minutes Intravenous Every 6 hours 10/12/22 1336     10/12/22 1345  vancomycin (VANCOREADY) IVPB 1750 mg/350 mL  Status:  Discontinued        1,750 mg 175 mL/hr over 120 Minutes Intravenous  Once 10/12/22 1336 10/12/22 1734   10/12/22 0200  piperacillin-tazobactam (ZOSYN) IVPB 3.375 g  Status:  Discontinued        3.375 g 12.5 mL/hr over 240 Minutes Intravenous Every 8 hours 10/11/22 2240 10/12/22 1320   10/11/22  2000  piperacillin-tazobactam (ZOSYN) IVPB 3.375 g        3.375 g 100 mL/hr over 30 Minutes Intravenous  Once 10/11/22 1948 10/11/22 2209         Subjective  The patient is resting comfortably. No new complaints.  Objective   Vitals:  Vitals:   10/14/22 1455 10/14/22 1624  BP:  112/70  Pulse: 71 75  Resp: 17 19  Temp:  98.3 F (36.8 C)  SpO2: 98% 99%    Exam:  Constitutional:  Appears calm and comfortable Neck:  neck appears normal, no masses, normal ROM, supple no thyromegaly Tracheostomy is clean and functioning normally. Respiratory:  CTA bilaterally, no w/r/r.  Respiratory effort normal. No retractions or accessory muscle use Cardiovascular:  RRR, no m/r/g No LE extremity edema   Normal pedal pulses Abdomen:  Abdomen appears normal; no tenderness or masses No hernias No HSM Musculoskeletal:  Digits/nails BUE: no clubbing, cyanosis, petechiae, infection exam of joints, bones, muscles of at least one of following: head/neck, RUE, LUE, RLE, LLE   strength and tone normal, no atrophy, no abnormal movements No tenderness, masses Normal ROM, no contractures  gait and station Skin:  No rashes, lesions, ulcers palpation of skin: no induration or nodules Neurologic:  CN 2-12 intact Sensation all 4 extremities intact Psychiatric:  Mental status Mood, affect appropriate Orientation to person, place, time  judgment and insight appear intact     I have personally reviewed the following:   Today's Data   Vitals:   10/14/22 1455 10/14/22 1624  BP:  112/70  Pulse: 71 75  Resp: 17 19  Temp:  98.3 F (36.8 C)  SpO2: 98% 99%     Lab Data  CBC    Component Value Date/Time   WBC 5.0 10/14/2022 0252   RBC 2.49 (L) 10/14/2022 0252   HGB 7.7 (L) 10/14/2022 0252   HCT 23.5 (L) 10/14/2022 0252   PLT 159 10/14/2022 0252   MCV 94.4 10/14/2022 0252   MCH 30.9 10/14/2022 0252   MCHC 32.8 10/14/2022 0252   RDW 12.8 10/14/2022 0252   LYMPHSABS 0.3 (L)  10/11/2022 1800   MONOABS 0.4 10/11/2022 1800   EOSABS 0.0 10/11/2022 1800   BASOSABS 0.0 10/11/2022 1800      Latest Ref Rng & Units 10/14/2022    2:52 AM 10/13/2022    1:00 AM 10/12/2022    3:30 AM  BMP  Glucose 70 - 99 mg/dL 108  179  144   BUN 6 - 20 mg/dL 9  12  17   $ Creatinine 0.61 - 1.24 mg/dL 0.85  0.95  1.08   Sodium 135 - 145 mmol/L 138  132  134   Potassium 3.5 - 5.1 mmol/L 3.2  3.6  3.4   Chloride 98 - 111 mmol/L 97  92  95   CO2 22 - 32 mmol/L 30  28  29   $ Calcium 8.9 - 10.3 mg/dL 7.8  7.2  6.7      Micro Data   Results for orders placed or performed during the hospital encounter of 10/11/22  Blood culture (routine x 2)     Status: None (Preliminary result)   Collection Time: 10/11/22  9:06 PM   Specimen: BLOOD  Result Value Ref Range Status   Specimen Description BLOOD BLOOD RIGHT HAND  Final   Special Requests   Final    BOTTLES DRAWN AEROBIC AND ANAEROBIC Blood Culture adequate volume   Culture   Final    NO GROWTH 3 DAYS Performed at [facility], [place] [address] [place], [place] [address]    Report Status PENDING  Incomplete  Resp panel by RT-PCR (RSV, Flu A&B, Covid)     Status: None   Collection Time: 10/11/22  9:06 PM   Specimen: Nasal Swab  Result Value Ref Range Status   SARS Coronavirus 2 by RT PCR NEGATIVE NEGATIVE Final   Influenza A by PCR NEGATIVE NEGATIVE Final   Influenza B by PCR NEGATIVE NEGATIVE Final    Comment: (NOTE) The Xpert Xpress SARS-CoV-2/FLU/RSV plus assay is intended as an aid in the diagnosis of influenza from Nasopharyngeal swab specimens and should not be used as a sole basis for treatment. Nasal washings and aspirates are unacceptable for Xpert Xpress SARS-CoV-2/FLU/RSV testing.  Fact Sheet for Patients: EntrepreneurPulse.com.au  Fact Sheet for Healthcare Providers: IncredibleEmployment.be  This test is not yet approved or cleared by the [place] FDA and has been  authorized for detection and/or diagnosis of SARS-CoV-2 by FDA under an Emergency Use Authorization (EUA). This EUA will remain in effect (meaning this test can be used) for the duration of the COVID-19 declaration under Section 564(b)(1) of the Act, 21 U.S.C. section 360bbb-3(b)(1), unless the authorization is terminated or revoked.     Resp Syncytial Virus by PCR NEGATIVE NEGATIVE Final    Comment: (NOTE) Fact Sheet  for Patients: EntrepreneurPulse.com.au  Fact Sheet for Healthcare Providers: IncredibleEmployment.be  This test is not yet approved or cleared by the [place] FDA and has been authorized for detection and/or diagnosis of SARS-CoV-2 by FDA under an Emergency Use Authorization (EUA). This EUA will remain in effect (meaning this test can be used) for the duration of the COVID-19 declaration under Section 564(b)(1) of the Act, 21 U.S.C. section 360bbb-3(b)(1), unless the authorization is terminated or revoked.  Performed at [facility], [place] [address] [place], [place] [address]   Blood culture (routine x 2)     Status: None (Preliminary result)   Collection Time: 10/11/22  9:11 PM   Specimen: BLOOD  Result Value Ref Range Status   Specimen Description BLOOD BLOOD LEFT HAND  Final   Special Requests   Final    BOTTLES DRAWN AEROBIC AND ANAEROBIC Blood Culture adequate volume   Culture   Final    NO GROWTH 3 DAYS Performed at [facility], [place] [address] [place], [place] [address]    Report Status PENDING  Incomplete  MRSA Next Gen by PCR, Nasal     Status: Abnormal   Collection Time: 10/13/22  5:16 AM   Specimen: Nasal Mucosa; Nasal Swab  Result Value Ref Range Status   MRSA by PCR Next Gen DETECTED (A) NOT DETECTED Final    Comment: RESULT CALLED TO, READ BACK BY AND VERIFIED WITH: RN A BARLOW [id number] AT 48 AM BY CM (NOTE) The GeneXpert MRSA Assay (FDA approved for NASAL specimens only), is one component  of a comprehensive MRSA colonization surveillance program. It is not intended to diagnose MRSA infection nor to guide or monitor treatment for MRSA infections. Test performance is not FDA approved in patients less than 1 years old. Performed at [facility], [place] [address], [place],  [address]     Scheduled Meds:  Chlorhexidine Gluconate Cloth  6 each Topical Q0600   enoxaparin (LOVENOX) injection  40 mg Subcutaneous Q24H   levothyroxine  137 mcg Oral QAC breakfast   lidocaine  1 patch Transdermal QHS   mupirocin ointment  1 Application Nasal BID   vitamin B-12  100 mcg Oral Daily   Continuous Infusions:  ampicillin-sulbactam (UNASYN) IV 3 g (10/14/22 1258)   lactated ringers 75 mL/hr at 10/13/22 1103   vancomycin 750 mg (10/14/22 1448)    Principal Problem:   Aspiration pneumonia (HCC) Active Problems:   Cellulitis, neck   History of throat cancer   Hypothyroidism   Chronic hepatitis C (HCC)   Nodule of skin of right upper extremity   LOS: 3 days    A & P  Chronic hepatitis C ([place]) -Has hx of untreated hepatitis C going back to 2022 and never followed up with GI. Tells me today he was never told of diagnoses but seems he has been on two separate occasions.  -check HCV RNA quant -needs to establish with primary and GI for treatment  Hypothyroidism -s/p total thyroidectomy  -has stopped taking medication at least 6 months ago due to financial constraints. Appears to be homeless and living with a friend. -TSH elevated at 56.362 on 10/11/2022. Will check FT3 and FT4. -Patient is receiving levothyroxine 137 mcg daily.  History of throat cancer -head and neck s/p rdial neck dissection, laryngectomy, total thyroidectomy, tracheostomy. Reports previously following with ENT at [facility] last year but has not been following up since. - He has been evaluated by ENT. Plan  is to continue the Shiley 5 cuffless tube until laryngectomy tube is available.  -The  patient will need to follow up with Head and Neck surgery at [facility] at discharge. TOC consulted to provide transportation for this follow up.  Cellulitis, neck -neck cellulitis spreading to stoma and to anterior chest  -CT neck soft tissue did not show any abscess  _ Blood culture x 2 have had no growth -MRSA screen is positive -continue IV Vancomycin/Unasyn  Aspiration pneumonia (HCC) -stable on room air -continue IV Unasyn -PRN suction of tracheostomy stoma   Nodule of skin of right upper extremity -has painful firm nodule on anterior right forearm concerning for possible abscess. Will obtain soft tissue ultrasound;will need I&D if positive for abscess and to add IV vancomycin.  DVT prophylaxis: Lovenox Code Status: Full Code Family Communication: None available Disposition Plan: tbd. Homeless    Jordie Skalsky, DO [facility] Direct contact: see [email or web address]  7PM-7AM contact night coverage as above 10/14/2022, 5:28 PM  LOS: 3 days

## 2022-10-14 NOTE — Progress Notes (Signed)
Speech Language Pathology Treatment: Dysphagia  Patient Details Name: Gregory Snyder MRN: [id number] DOB: 03/19/1976 Today's Date: 10/14/2022 Time: [id number] SLP Time Calculation (min) (ACUTE ONLY): 24 min  Assessment / Plan / Recommendation Clinical Impression  Pt was seen this afternoon during consumption of thin liquids. His stoma was not directly visualized as he kept his trach in place (other supplies being ordered), but there was no overt coughing associated with thin liquids to suggest leaking. Pt acknowledges that he has seen leaking in the past, and that it always seems to be associated with coughing for him. He denies any recent coughing or observed leaking. I do still have concerns overall about the integrity of his TEP given the length of time that this has been in place as well as the limited access he has had to cleaning supplies. He will require OP SLP, ideally with primary SLP in [facility], as inadequate care of his TWP is a potential source for recurrent infection. Note that overall limitations to clean stoma supplies can be a source for recurrent infection as well. Appreciate TOC efforts in trying to facilitate more consistent access to these supplies.   SLP is working with [facility] and nursing to obtain appropriate stoma supplies for inpatient stay as well as on an ongoing basis post-discharge. Note that TOC is having difficulty accessing certain resources for pt given his inability to voice s/p total laryngectomy. MD, please consider placing SLP Laryngectomy order so that SLP can continue to work on accessing supplies and addressing communication barriers.    HPI HPI: Patient is a 47 y.o. 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 He presented to the hospital on 10/11/22 with increase in neck swelling. He previously had TEP placed in May of 2023 but has not followed up with ENT at [facility] for at least a year and has  not been taking any of his medications. ENT at [facility] evaluated patient and removed obstructive mucous plug from stoma, debrided with saline and placed a Shiley #4 cuffless tube to keep stoma open and recommended ordering laryngectomy tubes and HME's prior to discharge. There is concern that patient is having leaking from his TEP which could be leading to aspiration.      SLP Plan  Continue with current plan of care      Recommendations for follow up therapy are one component of a multi-disciplinary discharge planning process, led by the attending physician.  Recommendations may be updated based on patient status, additional functional criteria and insurance authorization.    Recommendations  Diet recommendations: Regular;Thin liquid Liquids provided via: Cup;Straw Medication Administration: Whole meds with liquid Supervision: Patient able to self feed Postural Changes and/or Swallow Maneuvers: Seated upright 90 degrees                Oral Care Recommendations: Oral care BID Follow Up Recommendations: Outpatient SLP Assistance recommended at discharge: PRN SLP Visit Diagnosis: Dysphagia, unspecified (R13.10) Plan: Continue with current plan of care           Osie Bond., M.A. [place] Office [phone]  Secure chat preferred   10/14/2022, 4:42 PM

## 2022-10-14 NOTE — Evaluation (Signed)
Physical Therapy Evaluation Patient Details Name: Gregory Snyder MRN: [id number] DOB: 1975/12/24 Today's Date: 10/14/2022  History of Present Illness  47 year old admitted 2/12 with aspiration PNA, Mucus Plug, cellulitis of the neck. 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  Clinical Impression  Pt admitted with above diagnosis. Requested [facility] training after working with OT using [place]. Pt educated on appropriate use. Tolerated DGI testing and navigating stairs without physical assistance. Mild balance impairments noted with dynamic challenges but no overt LOB with tasks assessed. Pt confident with SPC use. Will continue to progress during admission. Pt currently with functional limitations due to the deficits listed below (see PT Problem List). Pt will benefit from skilled PT to increase their independence and safety with mobility to allow discharge to the venue listed below.          Recommendations for follow up therapy are one component of a multi-disciplinary discharge planning process, led by the attending physician.  Recommendations may be updated based on patient status, additional functional criteria and insurance authorization.  Follow Up Recommendations No PT follow up      Assistance Recommended at Discharge None  Patient can return home with the following  Assist for transportation    Equipment Recommendations Cane  Recommendations for Other Services       Functional Status Assessment Patient has had a recent decline in their functional status and demonstrates the ability to make significant improvements in function in a reasonable and predictable amount of time.     Precautions / Restrictions Precautions Precautions: Other (comment) Precaution Comments: monitor O2 Restrictions Weight Bearing Restrictions: No      Mobility  Bed Mobility               General bed mobility comments: sitting  EOB    Transfers Overall transfer level: Modified independent Equipment used: Straight cane               General transfer comment: Requests SPC trial with OT. Brought and educated on use. Performed without issues sit<>stand.    Ambulation/Gait Ambulation/Gait assistance: Supervision Gait Distance (Feet): 200 Feet Assistive device: Straight cane Gait Pattern/deviations: Drifts right/left, Step-through pattern, Decreased stride length Gait velocity: decr Gait velocity interpretation: 1.31 - 2.62 ft/sec, indicative of limited community ambulator   General Gait Details: Educated on safe AD use with SPC. performed DGI (see below). Minor drifting noted with use, gradually improved through duration of training with cues and adjustments to cane height. No buckling or ovrt LOB during session.  Stairs Stairs: Yes Stairs assistance: Supervision Stair Management: No rails, Forwards, Step to pattern, With cane Number of Stairs: 3 General stair comments: Educated on safe AD use with SPC navigating steps. Step-to pattern, cues for sequencing. Performed safely without need for physical assist. Supervision for safety while trianing.  Wheelchair Mobility    Modified Rankin (Stroke Patients Only)       Balance Overall balance assessment: Needs assistance Sitting-balance support: No upper extremity supported, Feet supported Sitting balance-Leahy Scale: Normal     Standing balance support: No upper extremity supported, During functional activity, Bilateral upper extremity supported Standing balance-Leahy Scale: Fair                   Standardized Balance Assessment Standardized Balance Assessment : Dynamic Gait Index   Dynamic Gait Index Level Surface: Mild Impairment Change in Gait Speed: Normal Gait with Horizontal Head Turns: Mild Impairment Gait with Vertical Head Turns:  Mild Impairment Gait and Pivot Turn: Mild Impairment Step Over Obstacle: Normal Step Around  Obstacles: Normal Steps: Mild Impairment Total Score: 19       Pertinent Vitals/Pain Pain Assessment Pain Assessment: No/denies pain    Home Living Family/patient expects to be discharged to:: Shelter/Homeless                   Additional Comments: lives on [address]. reports he can sometimes stay with a friend when it is cold outside    Prior Function Prior Level of Function : Independent/Modified Independent             Mobility Comments: Not utilizing an AD ADLs Comments: active at baseline, running and riding a bike often 3 miles to/from grocery store     Hand Dominance   Dominant Hand: Right    Extremity/Trunk Assessment   Upper Extremity Assessment Upper Extremity Assessment: Defer to OT evaluation    Lower Extremity Assessment Lower Extremity Assessment: Overall WFL for tasks assessed    Cervical / Trunk Assessment Cervical / Trunk Assessment: Normal  Communication   Communication: Tracheostomy  Cognition Arousal/Alertness: Awake/alert Behavior During Therapy: WFL for tasks assessed/performed Overall Cognitive Status: Within Functional Limits for tasks assessed                                          General Comments General comments (skin integrity, edema, etc.): SpO2 96% on RA    Exercises     Assessment/Plan    PT Assessment Patient needs continued PT services  PT Problem List Decreased balance;Decreased mobility;Decreased knowledge of use of DME       PT Treatment Interventions DME instruction;Gait training;Stair training;Functional mobility training;Therapeutic activities;Therapeutic exercise;Balance training;Neuromuscular re-education;Patient/family education    PT Goals (Current goals can be found in the Care Plan section)  Acute Rehab PT Goals Patient Stated Goal: Get well PT Goal Formulation: With patient Time For Goal Achievement: 10/28/22 Potential to Achieve Goals: Good    Frequency Min 3X/week      Co-evaluation               AM-PAC PT "6 Clicks" Mobility  Outcome Measure Help needed turning from your back to your side while in a flat bed without using bedrails?: None Help needed moving from lying on your back to sitting on the side of a flat bed without using bedrails?: None Help needed moving to and from a bed to a chair (including a wheelchair)?: None Help needed standing up from a chair using your arms (e.g., wheelchair or bedside chair)?: None Help needed to walk in hospital room?: A Little Help needed climbing 3-5 steps with a railing? : A Little 6 Click Score: 22    End of Session Equipment Utilized During Treatment: Gait belt Activity Tolerance: Patient tolerated treatment well Patient left: in bed;with call bell/phone within reach;with bed alarm set Nurse Communication: Mobility status PT Visit Diagnosis: Unsteadiness on feet (R26.81);Other abnormalities of gait and mobility (R26.89)    Time: [id number] PT Time Calculation (min) (ACUTE ONLY): 17 min   Charges:   PT Evaluation $PT Eval Low Complexity: [place], PT, DPT Physical Therapist Acute Rehabilitation Services [place]   Ellouise Newer 10/14/2022, 10:52 AM

## 2022-10-14 NOTE — Evaluation (Signed)
Occupational Therapy Evaluation/Discharge Patient Details Name: Gregory Snyder MRN: [id number] DOB: 02-22-1976 Today's Date: 10/14/2022   History of Present Illness 47 year old admitted 2/12 with aspiration PNA, Mucus Plug, cellulitis of the neck. 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   Clinical Impression   PTA, pt with housing security but can stay with a friend when the temperature drops outside. Pt typically active at baseline, running and biking "to stay fit" per pt. Pt presents now Modified Independent with ADLs, preferring use of RW for mobility though feel this DME not needed at this time. Pt able to mobilize around unit without LOB or SOB, no supplemental O2 needed to stay > 90%. Would encourage continued mobility while admitted though no further skilled OT services needed at this time. OT to sign off.       Recommendations for follow up therapy are one component of a multi-disciplinary discharge planning process, led by the attending physician.  Recommendations may be updated based on patient status, additional functional criteria and insurance authorization.   Follow Up Recommendations  No OT follow up     Assistance Recommended at Discharge None  Patient can return home with the following      Functional Status Assessment  Patient has not had a recent decline in their functional status  Equipment Recommendations  None recommended by OT    Recommendations for Other Services       Precautions / Restrictions Precautions Precautions: Other (comment) Precaution Comments: monitor O2 Restrictions Weight Bearing Restrictions: No      Mobility Bed Mobility Overal bed mobility: Modified Independent                  Transfers Overall transfer level: Independent Equipment used: None                      Balance Overall balance assessment: Needs assistance Sitting-balance  support: No upper extremity supported, Feet supported Sitting balance-Leahy Scale: Normal     Standing balance support: No upper extremity supported, During functional activity, Bilateral upper extremity supported Standing balance-Leahy Scale: Fair Standing balance comment: fair+, preferred to use RW for mobility                           ADL either performed or assessed with clinical judgement   ADL Overall ADL's : Modified independent                                       General ADL Comments: Modified Indepedent when using RW. Pt able to mobilize around unit using RW, often reaching out to navigate IV pole as well. likely RW not needed though pt politely declined to attempt without. Discussed routine in community, endurance needs and SpO2 > 90% without O2, denied any SOB or dizziness     Vision Ability to See in Adequate Light: 0 Adequate Patient Visual Report: No change from baseline Vision Assessment?: No apparent visual deficits     Perception     Praxis      Pertinent Vitals/Pain Pain Assessment Pain Assessment: No/denies pain     Hand Dominance Right   Extremity/Trunk Assessment Upper Extremity Assessment Upper Extremity Assessment: Defer to OT evaluation   Lower Extremity Assessment Lower Extremity Assessment: Overall WFL for tasks assessed   Cervical / Trunk Assessment  Cervical / Trunk Assessment: Normal   Communication Communication Communication: Tracheostomy   Cognition Arousal/Alertness: Awake/alert Behavior During Therapy: WFL for tasks assessed/performed Overall Cognitive Status: Within Functional Limits for tasks assessed                                       General Comments  SpO2 96% on RA    Exercises     Shoulder Instructions      Home Living Family/patient expects to be discharged to:: Shelter/Homeless                                 Additional Comments: lives on [address].  reports he can sometimes stay with a friend when it is cold outside      Prior Functioning/Environment Prior Level of Function : Independent/Modified Independent             Mobility Comments: Not utilizing an AD ADLs Comments: active at baseline, running and riding a bike often 3 miles to/from grocery store        OT Problem List:        OT Treatment/Interventions:      OT Goals(Current goals can be found in the care plan section) Acute Rehab OT Goals Patient Stated Goal: find somewhere to stay OT Goal Formulation: All assessment and education complete, DC therapy  OT Frequency:      Co-evaluation              AM-PAC OT "6 Clicks" Daily Activity     Outcome Measure Help from another person eating meals?: None Help from another person taking care of personal grooming?: None Help from another person toileting, which includes using toliet, bedpan, or urinal?: None Help from another person bathing (including washing, rinsing, drying)?: None Help from another person to put on and taking off regular upper body clothing?: None Help from another person to put on and taking off regular lower body clothing?: None 6 Click Score: 24   End of Session Equipment Utilized During Treatment: Rolling walker (2 wheels) Nurse Communication: Mobility status  Activity Tolerance: Patient tolerated treatment well Patient left: in bed;with call bell/phone within reach;with nursing/sitter in room  OT Visit Diagnosis: Other abnormalities of gait and mobility (R26.89)                [id number] OT Time Calculation (min): 31 min Charges:  OT General Charges $OT Visit: 1 Visit OT Evaluation $OT Eval Low Complexity: 1 Low OT Treatments $Self Care/Home Management : 8-22 mins  Gregory Snyder, OTR/L Acute Rehab Services Office: [phone]   Gregory Snyder 10/14/2022, 10:16 AM
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
## 2022-10-15 DIAGNOSIS — J690 Pneumonitis due to inhalation of food and vomit: Secondary | ICD-10-CM | POA: Diagnosis not present

## 2022-10-15 DIAGNOSIS — B182 Chronic viral hepatitis C: Secondary | ICD-10-CM | POA: Diagnosis not present

## 2022-10-15 DIAGNOSIS — Z85819 Personal history of malignant neoplasm of unspecified site of lip, oral cavity, and pharynx: Secondary | ICD-10-CM | POA: Diagnosis not present

## 2022-10-15 DIAGNOSIS — L03221 Cellulitis of neck: Secondary | ICD-10-CM | POA: Diagnosis not present

## 2022-10-15 LAB — CBC
HCT: 23.8 % — ABNORMAL LOW (ref 39.0–52.0)
Hemoglobin: 7.9 g/dL — ABNORMAL LOW (ref 13.0–17.0)
MCH: 31.1 pg (ref 26.0–34.0)
MCHC: 33.2 g/dL (ref 30.0–36.0)
MCV: 93.7 fL (ref 80.0–100.0)
Platelets: 157 10*3/uL (ref 150–400)
RBC: 2.54 MIL/uL — ABNORMAL LOW (ref 4.22–5.81)
RDW: 13.1 % (ref 11.5–15.5)
WBC: 3.5 10*3/uL — ABNORMAL LOW (ref 4.0–10.5)
nRBC: 0.6 % — ABNORMAL HIGH (ref 0.0–0.2)

## 2022-10-15 LAB — VANCOMYCIN, PEAK: Vancomycin Pk: 15 ug/mL — ABNORMAL LOW (ref 30–40)

## 2022-10-15 LAB — BASIC METABOLIC PANEL
Anion gap: 9 (ref 5–15)
BUN: 7 mg/dL (ref 6–20)
CO2: 33 mmol/L — ABNORMAL HIGH (ref 22–32)
Calcium: 7.7 mg/dL — ABNORMAL LOW (ref 8.9–10.3)
Chloride: 97 mmol/L — ABNORMAL LOW (ref 98–111)
Creatinine, Ser: 0.79 mg/dL (ref 0.61–1.24)
GFR, Estimated: >60 mL/min (ref >60)
Glucose, Bld: 93 mg/dL (ref 70–99)
Potassium: 3.2 mmol/L — ABNORMAL LOW (ref 3.5–5.1)
Sodium: 139 mmol/L (ref 135–145)

## 2022-10-15 MED ORDER — VANCOMYCIN HCL IN DEXTROSE 1-5 GM/200ML-% IV SOLN
1000.0000 mg | Freq: Three times a day (TID) | INTRAVENOUS | Status: DC
  Administered 2022-10-15 – 2022-10-16 (×3): 1000 mg via INTRAVENOUS
  Filled 2022-10-15 (×3): qty 200

## 2022-10-15 MED ORDER — POTASSIUM CHLORIDE 10 MEQ/100ML IV SOLN
10.0000 meq | INTRAVENOUS | Status: AC
  Administered 2022-10-15 (×4): 10 meq via INTRAVENOUS
  Filled 2022-10-15 (×4): qty 100

## 2022-10-15 NOTE — Progress Notes (Signed)
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 He has not been using a tracheostomy tube since May last year. He presented with increased  neck pain and erythema that started the day of admission. He has had difficulty breathing.   Hx of SCC Cx of the head and neck with tracheostomy stoma here with aspiration pneumonia on room air and cellulitis of right neck. His   Lidocaine patch has been ordered for his back pain. The patient is complaining of left sided facial/neck swelling and erythema. It was explained to him that this was due to cellulitis for which the patient is receiving vancomycin.  Consultants  ENT  Procedures  Tracheostomy tube was replaced and mucus plug removed 10/12/2022.  Antibiotics   Anti-infectives (From admission, onward)    Start     Dose/Rate Route Frequency Ordered Stop   10/15/22 1400  vancomycin (VANCOCIN) IVPB 1000 mg/200 mL premix        1,000 mg 100 mL/hr over 120 Minutes Intravenous Every 8 hours 10/15/22 1213     10/13/22 0600  vancomycin (VANCOREADY) IVPB 750 mg/150 mL  Status:  Discontinued        750 mg 150 mL/hr over 60 Minutes Intravenous Every 8 hours 10/12/22 1409 10/15/22 1213   10/12/22 1845  vancomycin (VANCOREADY) IVPB 1750 mg/350 mL        1,750 mg 175 mL/hr over 120 Minutes Intravenous  Once 10/12/22 1734 10/12/22 2321   10/12/22 1345  Ampicillin-Sulbactam (UNASYN) 3 g in sodium chloride 0.9 % 100 mL IVPB        3 g 200 mL/hr over 30 Minutes Intravenous Every 6 hours 10/12/22 1336     10/12/22 1345  vancomycin (VANCOREADY) IVPB 1750 mg/350 mL  Status:  Discontinued        1,750 mg 175 mL/hr over 120 Minutes Intravenous  Once 10/12/22 1336 10/12/22 1734    10/12/22 0200  piperacillin-tazobactam (ZOSYN) IVPB 3.375 g  Status:  Discontinued        3.375 g 12.5 mL/hr over 240 Minutes Intravenous Every 8 hours 10/11/22 2240 10/12/22 1320   10/11/22 2000  piperacillin-tazobactam (ZOSYN) IVPB 3.375 g        3.375 g 100 mL/hr over 30 Minutes Intravenous  Once 10/11/22 1948 10/11/22 2209         Subjective  The patient is resting comfortably. No new complaints.  Objective   Vitals:  Vitals:   10/15/22 1153 10/15/22 1541  BP:    Pulse: 74 77  Resp:  18  Temp:    SpO2: 94% 95%    Exam:  Constitutional:  Appears calm and comfortable Neck:  neck appears normal, no masses, normal ROM, supple. Swelling improved no thyromegaly Tracheostomy is clean and functioning normally. Respiratory:  CTA bilaterally, no w/r/r.  Respiratory effort normal. No retractions or accessory muscle use Cardiovascular:  RRR, no m/r/g No LE extremity edema   Normal pedal pulses Abdomen:  Abdomen appears normal; no tenderness or masses No hernias No HSM Musculoskeletal:  Digits/nails BUE: no clubbing, cyanosis, petechiae, infection exam of joints, bones, muscles of at least one of following: head/neck, RUE, LUE, RLE, LLE   strength and tone normal, no atrophy, no abnormal movements No tenderness, masses Normal ROM, no contractures  gait  and station Skin:  No rashes, lesions, ulcers palpation of skin: no induration or nodules Neurologic:  CN 2-12 intact Sensation all 4 extremities intact Psychiatric:  Mental status Mood, affect appropriate Orientation to person, place, time  judgment and insight appear intact     I have personally reviewed the following:   Today's Data   Vitals:   10/15/22 1153 10/15/22 1541  BP:    Pulse: 74 77  Resp:  18  Temp:    SpO2: 94% 95%     Lab Data  CBC    Component Value Date/Time   WBC 3.5 (L) 10/15/2022 0232   RBC 2.54 (L) 10/15/2022 0232   HGB 7.9 (L) 10/15/2022 0232   HCT 23.8 (L) 10/15/2022  0232   PLT 157 10/15/2022 0232   MCV 93.7 10/15/2022 0232   MCH 31.1 10/15/2022 0232   MCHC 33.2 10/15/2022 0232   RDW 13.1 10/15/2022 0232   LYMPHSABS 0.3 (L) 10/11/2022 1800   MONOABS 0.4 10/11/2022 1800   EOSABS 0.0 10/11/2022 1800   BASOSABS 0.0 10/11/2022 1800      Latest Ref Rng & Units 10/15/2022    2:32 AM 10/14/2022    2:52 AM 10/13/2022    1:00 AM  BMP  Glucose 70 - 99 mg/dL 93  108  179   BUN 6 - 20 mg/dL 7  9  12   $ Creatinine 0.61 - 1.24 mg/dL 0.79  0.85  0.95   Sodium 135 - 145 mmol/L 139  138  132   Potassium 3.5 - 5.1 mmol/L 3.2  3.2  3.6   Chloride 98 - 111 mmol/L 97  97  92   CO2 22 - 32 mmol/L 33  30  28   Calcium 8.9 - 10.3 mg/dL 7.7  7.8  7.2      Micro Data   Results for orders placed or performed during the hospital encounter of 10/11/22  Blood culture (routine x 2)     Status: None (Preliminary result)   Collection Time: 10/11/22  9:06 PM   Specimen: BLOOD  Result Value Ref Range Status   Specimen Description BLOOD BLOOD RIGHT HAND  Final   Special Requests   Final    BOTTLES DRAWN AEROBIC AND ANAEROBIC Blood Culture adequate volume   Culture   Final    NO GROWTH 4 DAYS Performed at [facility], [address] [place], [place] [address]    Report Status PENDING  Incomplete  Resp panel by RT-PCR (RSV, Flu A&B, Covid)     Status: None   Collection Time: 10/11/22  9:06 PM   Specimen: Nasal Swab  Result Value Ref Range Status   SARS Coronavirus 2 by RT PCR NEGATIVE NEGATIVE Final   Influenza A by PCR NEGATIVE NEGATIVE Final   Influenza B by PCR NEGATIVE NEGATIVE Final    Comment: (NOTE) The Xpert Xpress SARS-CoV-2/FLU/RSV plus assay is intended as an aid in the diagnosis of influenza from Nasopharyngeal swab specimens and should not be used as a sole basis for treatment. Nasal washings and aspirates are unacceptable for Xpert Xpress SARS-CoV-2/FLU/RSV testing.  Fact Sheet for Patients: EntrepreneurPulse.com.au  Fact  Sheet for Healthcare Providers: IncredibleEmployment.be  This test is not yet approved or cleared by the [place] FDA and has been authorized for detection and/or diagnosis of SARS-CoV-2 by FDA under an Emergency Use Authorization (EUA). This EUA will remain in effect (meaning this test can be used) for the duration of the COVID-19 declaration  under Section 564(b)(1) of the Act, 21 U.S.C. section 360bbb-3(b)(1), unless the authorization is terminated or revoked.     Resp Syncytial Virus by PCR NEGATIVE NEGATIVE Final    Comment: (NOTE) Fact Sheet for Patients: EntrepreneurPulse.com.au  Fact Sheet for Healthcare Providers: IncredibleEmployment.be  This test is not yet approved or cleared by the [place] FDA and has been authorized for detection and/or diagnosis of SARS-CoV-2 by FDA under an Emergency Use Authorization (EUA). This EUA will remain in effect (meaning this test can be used) for the duration of the COVID-19 declaration under Section 564(b)(1) of the Act, 21 U.S.C. section 360bbb-3(b)(1), unless the authorization is terminated or revoked.  Performed at [facility], [place] [address] [place], [place] [address]   Blood culture (routine x 2)     Status: None (Preliminary result)   Collection Time: 10/11/22  9:11 PM   Specimen: BLOOD  Result Value Ref Range Status   Specimen Description BLOOD BLOOD LEFT HAND  Final   Special Requests   Final    BOTTLES DRAWN AEROBIC AND ANAEROBIC Blood Culture adequate volume   Culture   Final    NO GROWTH 4 DAYS Performed at [facility], [place] [address] [place], [place] [address]    Report Status PENDING  Incomplete  MRSA Next Gen by PCR, Nasal     Status: Abnormal   Collection Time: 10/13/22  5:16 AM   Specimen: Nasal Mucosa; Nasal Swab  Result Value Ref Range Status   MRSA by PCR Next Gen DETECTED (A) NOT DETECTED Final    Comment: RESULT CALLED TO,  READ BACK BY AND VERIFIED WITH: RN A BARLOW [id number] AT 57 AM BY CM (NOTE) The GeneXpert MRSA Assay (FDA approved for NASAL specimens only), is one component of a comprehensive MRSA colonization surveillance program. It is not intended to diagnose MRSA infection nor to guide or monitor treatment for MRSA infections. Test performance is not FDA approved in patients less than 30 years old. Performed at [facility], [place] [address] [place], [place] [address]     Scheduled Meds:  Chlorhexidine Gluconate Cloth  6 each Topical Q0600   enoxaparin (LOVENOX) injection  40 mg Subcutaneous Q24H   levothyroxine  137 mcg Oral QAC breakfast   lidocaine  1 patch Transdermal QHS   mupirocin ointment  1 Application Nasal BID   polyethylene glycol  17 g Oral Daily   vitamin B-12  100 mcg Oral Daily   Continuous Infusions:  ampicillin-sulbactam (UNASYN) IV 3 g (10/15/22 1508)   lactated ringers 75 mL/hr at 10/13/22 1103   vancomycin 1,000 mg (10/15/22 1621)    Principal Problem:   Aspiration pneumonia (HCC) Active Problems:   Cellulitis, neck   History of throat cancer   Hypothyroidism   Chronic hepatitis C (HCC)   Nodule of skin of right upper extremity   LOS: 4 days    A & P  Chronic hepatitis C ([place]) -Has hx of untreated hepatitis C going back to 2022 and never followed up with GI. Tells me today he was never told of diagnoses but seems he has been on two separate occasions.  -check HCV RNA quant -needs to establish with primary and GI for treatment  Hypothyroidism -s/p total thyroidectomy  -has stopped taking medication at least 6 months ago due to financial constraints. Appears to be homeless and living with a friend. -TSH elevated at 56.362 on 10/11/2022. Will check FT3 and FT4. -Patient is receiving  levothyroxine 137 mcg daily.  History of throat cancer -head and neck s/p rdial neck dissection, laryngectomy, total thyroidectomy, tracheostomy. Reports previously following  with ENT at [facility] last year but has not been following up since. - He has been evaluated by ENT. Plan is to continue the Shiley 5 cuffless tube until laryngectomy tube is available.  -The patient will need to follow up with Head and Neck surgery at [facility] in [place]. He will need referral on discharge.   Cellulitis, neck -neck cellulitis spreading to stoma and to anterior chest  -CT neck soft tissue did not show any abscess  _ Blood culture x 2 have had no growth -MRSA screen is positive -continue IV Vancomycin/Unasyn -appears improved today.  Aspiration pneumonia ([place]) -stable on room air -continue IV Unasyn -PRN suction of tracheostomy stoma   Nodule of skin of right upper extremity -has painful firm nodule on anterior right forearm concerning for possible abscess. Will obtain soft tissue ultrasound;will need I&D if positive for abscess and to add IV vancomycin.  DVT prophylaxis: Lovenox Code Status: Full Code Family Communication: None available Disposition Plan: tbd. Homeless    Nanna Ertle, DO [facility] Direct contact: see [email or web address]  7PM-7AM contact night coverage as above 10/15/2022, 5:03 PM  LOS: 3 days

## 2022-10-15 NOTE — Progress Notes (Signed)
Speech Language Pathology Treatment: Dysphagia  Patient Details Name: Gregory Snyder MRN: [id number] DOB: 01/20/1976 Today's Date: 10/15/2022 Time: [id number] SLP Time Calculation (min) (ACUTE ONLY): 15 min  Assessment / Plan / Recommendation Clinical Impression  Pt continues to deny any overt signs of leaking including coughing with PO intake. None were observed by SLP today as direct observation was provided during thin liquid consumption. Recommend continuing with regular solids and thin liquids but agree with OP SLP f/u ASAP upon discharge to exchange (and/or consider removing) TEP. SLP to continue to follow for laryngectomy needs only at this time. Please reorder with any acute concerns or if it is felt necessary to perform MBS.   HPI HPI: Patient is a 47 y.o. 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 He presented to the hospital on 10/11/22 with increase in neck swelling. He previously had TEP placed in May of 2023 but has not followed up with ENT at [facility] for at least a year and has not been taking any of his medications. ENT at [facility] evaluated patient and removed obstructive mucous plug from stoma, debrided with saline and placed a Shiley #4 cuffless tube to keep stoma open and recommended ordering laryngectomy tubes and HME's prior to discharge. There is concern that patient is having leaking from his TEP which could be leading to aspiration.      SLP Plan  All goals met      Recommendations for follow up therapy are one component of a multi-disciplinary discharge planning process, led by the attending physician.  Recommendations may be updated based on patient status, additional functional criteria and insurance authorization.    Recommendations  Diet recommendations: Regular;Thin liquid Liquids provided via: Cup;Straw Medication Administration: Whole meds with liquid Supervision: Patient able to self  feed Postural Changes and/or Swallow Maneuvers: Seated upright 90 degrees                Oral Care Recommendations: Oral care BID Follow Up Recommendations: Outpatient SLP Assistance recommended at discharge: PRN SLP Visit Diagnosis: Dysphagia, unspecified (R13.10) Plan: All goals met           Osie Bond., M.A. [place] Office [phone]  Secure chat preferred   10/15/2022, 12:03 PM

## 2022-10-15 NOTE — Progress Notes (Signed)
Physical Therapy Treatment Patient Details Name: Gregory Snyder MRN: [id number] DOB: October 01, 1975 Today's Date: 10/15/2022   History of Present Illness 47 year old admitted 2/12 with aspiration PNA, Mucus Plug, cellulitis of the neck. 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    PT Comments    Continues to mobilize well, tolerating gait training today with SPC, showing improved symmetry of steps, minimal instability, and no evidence of buckling or overt LOB. Stable at sink performing hygiene care without UE support. All questions answered. SpO2 97% ambulating on RA. Patient will continue to benefit from skilled physical therapy services to further improve independence with functional mobility.    Recommendations for follow up therapy are one component of a multi-disciplinary discharge planning process, led by the attending physician.  Recommendations may be updated based on patient status, additional functional criteria and insurance authorization.  Follow Up Recommendations  No PT follow up     Assistance Recommended at Discharge None  Patient can return home with the following Assist for transportation   Equipment Recommendations  Cane    Recommendations for Other Services       Precautions / Restrictions Precautions Precautions: Other (comment) Precaution Comments: monitor O2 Restrictions Weight Bearing Restrictions: No     Mobility  Bed Mobility Overal bed mobility: Modified Independent             General bed mobility comments: no assist needed    Transfers Overall transfer level: Modified independent Equipment used: Straight cane               General transfer comment: Good control, stable with AD.    Ambulation/Gait Ambulation/Gait assistance: Supervision Gait Distance (Feet): 200 Feet Assistive device: Straight cane Gait Pattern/deviations: Step-through pattern, Decreased  stride length Gait velocity: decr Gait velocity interpretation: <1.8 ft/sec, indicate of risk for recurrent falls   General Gait Details: Minimal instability noted with AD for light support today. Improved sequencing with SPC, minor cues. No LOB, no buckling noted. SpO2 97% on RA throughout session. Feels confident.   Stairs             Wheelchair Mobility    Modified Rankin (Stroke Patients Only)       Balance Overall balance assessment: Needs assistance Sitting-balance support: No upper extremity supported, Feet supported Sitting balance-Leahy Scale: Normal     Standing balance support: No upper extremity supported, During functional activity Standing balance-Leahy Scale: Good Standing balance comment: Stood at sink washed face and brushed teeth, no LOB.                            Cognition Arousal/Alertness: Awake/alert Behavior During Therapy: WFL for tasks assessed/performed Overall Cognitive Status: Within Functional Limits for tasks assessed                                          Exercises      General Comments General comments (skin integrity, edema, etc.): SpO2 94% RA in bed, upwartds of 97% avg while ambulating. BP supine 99/59 HR 69; post ambulation while standing 96/61 HR 89. no dizziness.      Pertinent Vitals/Pain Pain Assessment Pain Assessment: No/denies pain    Home Living  Prior Function            PT Goals (current goals can now be found in the care plan section) Acute Rehab PT Goals Patient Stated Goal: Get well PT Goal Formulation: With patient Time For Goal Achievement: 10/28/22 Potential to Achieve Goals: Good Progress towards PT goals: Progressing toward goals    Frequency    Min 3X/week      PT Plan Current plan remains appropriate    Co-evaluation              AM-PAC PT "6 Clicks" Mobility   Outcome Measure  Help needed turning from your back  to your side while in a flat bed without using bedrails?: None Help needed moving from lying on your back to sitting on the side of a flat bed without using bedrails?: None Help needed moving to and from a bed to a chair (including a wheelchair)?: None Help needed standing up from a chair using your arms (e.g., wheelchair or bedside chair)?: None Help needed to walk in hospital room?: A Little Help needed climbing 3-5 steps with a railing? : A Little 6 Click Score: 22    End of Session Equipment Utilized During Treatment: Gait belt Activity Tolerance: Patient tolerated treatment well Patient left: with call bell/phone within reach;in chair (Staff entering room) Nurse Communication: Mobility status PT Visit Diagnosis: Unsteadiness on feet (R26.81);Other abnormalities of gait and mobility (R26.89)     Time: [id number] PT Time Calculation (min) (ACUTE ONLY): 29 min  Charges:  $Gait Training: 23-37 mins                      Candie Mile, PT, DPT Physical Therapist Acute Rehabilitation Services [facility] Outpatient Rehabilitation Services [facility]    Ellouise Newer 10/15/2022, 4:28 PM

## 2022-10-15 NOTE — Progress Notes (Signed)
In to see patient, and found that patients trach had been removed and a lary tube was placed. Vitals stable. RN was contacted for more information on patients airway changes and for an update on patient. Will continue to monitor patient.

## 2022-10-15 NOTE — Progress Notes (Addendum)
Pharmacy Antibiotic Note  Gregory Snyder is a 47 y.o. male admitted on 10/11/2022 presenting with neck swelling and drainage, hx of cancer s/p laryngectomy, also concern for aspiration pna.  Pharmacy has been consulted for Unasyn and vancomycin dosing.  Contacted by RN who informed that vancomycin infusion time is approx 1.5-2h. VP was collected during vancomycin infusion resulting low at 15 (approx 30 min left for infusion) Estimated VP using patient kinetics is ~19, and estimated VT is ~8 which are both subtherapeutic.   Plan: Adjust vancomycin to 1071m IV q8 hours (eAUC 509, Scr 0.8) Unasyn 3g IV every 6 hours Monitor renal function, Cx and clinical progression to narrow Vancomycin levels as indicated  Height: 5' 9"$  (175.3 cm) Weight: 81.6 kg (179 lb 14.3 oz) IBW/kg (Calculated) : 70.7  Temp (24hrs), Avg:98.1 F (36.7 C), Min:98 F (36.7 C), Max:98.3 F (36.8 C)  Recent Labs  Lab 10/11/22 1800 10/11/22 1812 10/11/22 2256 10/12/22 0330 10/13/22 0100 10/14/22 0252 10/15/22 0232 10/15/22 0806  WBC 5.4  --   --  3.6* 3.6* 5.0 3.5*  --   CREATININE 1.09 1.10  --  1.08 0.95 0.85 0.79  --   LATICACIDVEN  --   --  0.6 0.7  --   --   --   --   VANCOPEAK  --   --   --   --   --   --   --  15*     Estimated Creatinine Clearance: 115.4 mL/min (by C-G formula based on SCr of 0.79 mg/dL).    Allergies  Allergen Reactions   Motrin [Ibuprofen] Hives    Antimicrobials: Zosyn 2/12 >> 2/13 Unasyn 2/13 >> Vancomycin 2/13 >>    Dose adjustments: - VP drawn during infusion low at 15 - calc VP = 19, calc VP = 8 on 750 q8h. Incr Vanc to 1g IV q8h   Microbiology results: 2/12 bcx: ngtd 2/14 MRSA PCR: detected  MDimple Nanas PharmD, BCPS 10/15/2022 12:08 PM

## 2022-10-15 NOTE — Evaluation (Signed)
[facility] Speaking Valve - Evaluation Patient Details  Name: Minoru Quain MRN: [id number] Date of Birth: 10-Feb-1976  Today's Date: 10/15/2022 Time: 1110-1135 SLP Time Calculation (min) (ACUTE ONLY): 25 min  Past Medical History:  Past Medical History:  Diagnosis Date   Cancer [facility])    Past Surgical History:  Past Surgical History:  Procedure Laterality Date   TOTAL LARYNGECTOMY N/A 12/2020   HPI:  Patient is a 47 y.o. 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 He presented to the hospital on 10/11/22 with increase in neck swelling. He previously had TEP placed in May of 2023 but has not followed up with ENT at [facility] for at least a year and has not been taking any of his medications. ENT at [facility] evaluated patient and removed obstructive mucous plug from stoma, debrided with saline and placed a Shiley #4 cuffless tube to keep stoma open and recommended ordering laryngectomy tubes and HME's prior to discharge. There is concern that patient is having leaking from his TEP which could be leading to aspiration.    Assessment / Plan / Recommendation  Clinical Impression  Pt has a h/o total laryngectomy and has previously used larytubes (9/55) and HMEs until he lost access to supplies. Trach placed by ENT reportedly until lary supplies could be obtained. Pt removed his own trach and replaced it with his lary tube and HME. Mild coughing noted initially. SLP stayed and observed wtih no overt changes in respiratory status nad pt reporting improved comfort and breathing. He is independent in use but supplies were still shown to him from pulmonary kit to provide thorough education. He had no further questions except asking about additional supplies for discharge. Pt was also given opportunity to practice use of his electrolarynx with Min cues provided for intelligibility at the sentence level. SLP will continue to follow to  facilitate communication and laryngectomy needs. SLP Visit Diagnosis: Aphonia (R49.1)    SLP Assessment  Patient needs continued Speech [place] Pathology Services    Recommendations for follow up therapy are one component of a multi-disciplinary discharge planning process, led by the attending physician.  Recommendations may be updated based on patient status, additional functional criteria and insurance authorization.  Follow Up Recommendations  Outpatient SLP    Assistance Recommended at Discharge PRN  Functional Status Assessment Patient has had a recent decline in their functional status and demonstrates the ability to make significant improvements in function in a reasonable and predictable amount of time.  Frequency and Duration min 2x/week  2 weeks    PMSV Trial     Tracheostomy Tube       Vent Dependency       Cuff Deflation Trial           Osie Bond., M.A. [place] Office [phone]  Secure chat preferred  10/15/2022, 12:51 PM

## 2022-10-15 NOTE — Progress Notes (Signed)
Mr. Behrns now has a pulmonary kit - I gave this to him yesterday and set him up with his electrolarynx to practice. He will be discharged with the following supplies 2 larytubes (one in use) 50 + HMEs Tube brushes Shower guard Electrolarynx with charger Wrist band that says total neck breather A communication e-writer Lary tube neck strap  A prescription form was filled out for additional HMEs Brushes for cleaning And neck holders  Patient would probably benefit from a dry bag to store his supplies so they do not get wet/dirty if he is unable to qualify for shelter.  He is agreeable to having a [place] based ENT to manage his laryngectomy needs/appointments going forward.

## 2022-10-15 NOTE — TOC Progression Note (Signed)
Transition of Care [facility]) - Initial/Assessment Note    Patient Details  Name: Gregory Snyder MRN: [id number] Date of Birth: 1976/06/20  [facility] CM/SW Contact:    Milinda Antis, LCSWA Phone Number: 10/15/2022, 3:08 PM  Clinical Narrative:                 LCSW met with the patient at bedside. LCSW provided a patient with the following updates.  The patient can have mail and supplies sent to the [facility]. The patient will need to contact or go in person to the disability office to complete application and request [phone] representative to assist him with the application process. LCSW assisted the patient with calling the EBT hotline to request a new EBT card now that the patient has an electrolarynx.  The patient was able to have the card cancelled, but will have to contact [place] to give the new address where the card will be sent.  LCSW and patient called DSS and left a message requesting a returned call to update address to the [facility].     TOC following.      Barriers to Discharge: Continued Medical Work up, Homeless with medical needs   Patient Goals and CMS Choice Patient states their goals for this hospitalization and ongoing recovery are:: To find a stable home CMS Medicare.gov Compare Post Acute Care list provided to:: Patient Choice offered to / list presented to : Patient      Expected Discharge Plan and Services In-house Referral: Clinical Social Work, PCP / Psychologist, educational Discharge Planning Services: CM Consult, Medication Assistance   Living arrangements for the past 2 months: Homeless                                      Prior Living Arrangements/Services Living arrangements for the past 2 months: Homeless Lives with:: Self Patient language and need for interpreter reviewed:: Yes Do you feel safe going back to the place where you live?: Yes      Need for Family Participation in Patient Care: Yes (Comment) Care giver support system in  place?: Yes (comment)   Criminal Activity/Legal Involvement Pertinent to Current Situation/Hospitalization: No - Comment as needed  Activities of Daily Living Home Assistive Devices/Equipment: None ADL Screening (condition at time of admission) Patient's cognitive ability adequate to safely complete daily activities?: Yes Is the patient deaf or have difficulty hearing?: No Does the patient have difficulty seeing, even when wearing glasses/contacts?: No Does the patient have difficulty concentrating, remembering, or making decisions?: No Patient able to express need for assistance with ADLs?: No Does the patient have difficulty dressing or bathing?: No Independently performs ADLs?: Yes (appropriate for developmental age) Does the patient have difficulty walking or climbing stairs?: No Weakness of Legs: Both Weakness of Arms/Hands: None  Permission Sought/Granted Permission sought to share information with : Case Manager, Customer service manager, Family Supports Permission granted to share information with : Yes, Verbal Permission Granted              Emotional Assessment Appearance:: Appears stated age Attitude/Demeanor/Rapport: Engaged, Gracious Affect (typically observed): Accepting, Appropriate, Calm, Hopeful, Pleasant Orientation: : Oriented to Self, Oriented to Place, Oriented to  Time, Oriented to Situation Alcohol / Substance Use: Alcohol Use Psych Involvement: No (comment)  Admission diagnosis:  Aspiration pneumonia ([place]) [J69.0] Tracheitis [J04.10] Aspiration pneumonia of left upper lobe, unspecified aspiration pneumonia type (  [place]) [J69.0] Patient Active Problem List   Diagnosis Date Noted   Aspiration pneumonia ([place]) 10/11/2022   Cellulitis, neck 10/11/2022   History of throat cancer 10/11/2022   Hypothyroidism 10/11/2022   Chronic hepatitis C ([place]) 10/11/2022   Nodule of skin of right upper extremity 10/11/2022   Peripheral neuropathy 01/22/2022    Aspiration pneumonitis ([place])    Edema    Cellulitis 01/13/2022   Ludwig's angina    Throat cancer ([place])    Tongue swelling    PCP:  System, Provider Not In Pharmacy:   [place] [address] [place] [address] Phone: [phone] Fax: [phone]     Social Determinants of Health (SDOH) Social History: [place]: Low Risk  (10/12/2022)  Tobacco Use: Medium Risk (10/11/2022)   SDOH Interventions:     Readmission Risk Interventions     No data to display

## 2022-10-16 ENCOUNTER — Inpatient Hospital Stay (HOSPITAL_COMMUNITY): Payer: Medicaid Other

## 2022-10-16 DIAGNOSIS — D649 Anemia, unspecified: Secondary | ICD-10-CM | POA: Insufficient documentation

## 2022-10-16 DIAGNOSIS — J690 Pneumonitis due to inhalation of food and vomit: Secondary | ICD-10-CM | POA: Diagnosis not present

## 2022-10-16 DIAGNOSIS — L03221 Cellulitis of neck: Secondary | ICD-10-CM | POA: Diagnosis not present

## 2022-10-16 LAB — BASIC METABOLIC PANEL
Anion gap: 11 (ref 5–15)
BUN: 7 mg/dL (ref 6–20)
CO2: 33 mmol/L — ABNORMAL HIGH (ref 22–32)
Calcium: 7.5 mg/dL — ABNORMAL LOW (ref 8.9–10.3)
Chloride: 95 mmol/L — ABNORMAL LOW (ref 98–111)
Creatinine, Ser: 0.78 mg/dL (ref 0.61–1.24)
GFR, Estimated: >60 mL/min (ref >60)
Glucose, Bld: 123 mg/dL — ABNORMAL HIGH (ref 70–99)
Potassium: 3.2 mmol/L — ABNORMAL LOW (ref 3.5–5.1)
Sodium: 139 mmol/L (ref 135–145)

## 2022-10-16 LAB — CULTURE, BLOOD (ROUTINE X 2)
Culture: NO GROWTH
Culture: NO GROWTH
Special Requests: ADEQUATE
Special Requests: ADEQUATE

## 2022-10-16 LAB — CBC
HCT: 25.7 % — ABNORMAL LOW (ref 39.0–52.0)
Hemoglobin: 8.1 g/dL — ABNORMAL LOW (ref 13.0–17.0)
MCH: 30.5 pg (ref 26.0–34.0)
MCHC: 31.5 g/dL (ref 30.0–36.0)
MCV: 96.6 fL (ref 80.0–100.0)
Platelets: 192 10*3/uL (ref 150–400)
RBC: 2.66 MIL/uL — ABNORMAL LOW (ref 4.22–5.81)
RDW: 13.4 % (ref 11.5–15.5)
WBC: 3.5 10*3/uL — ABNORMAL LOW (ref 4.0–10.5)
nRBC: 1.4 % — ABNORMAL HIGH (ref 0.0–0.2)

## 2022-10-16 LAB — T4, FREE: Free T4: <0.25 ng/dL — ABNORMAL LOW (ref 0.61–1.12)

## 2022-10-16 MED ORDER — SENNOSIDES-DOCUSATE SODIUM 8.6-50 MG PO TABS
1.0000 | ORAL_TABLET | Freq: Two times a day (BID) | ORAL | Status: DC
  Administered 2022-10-16 – 2022-10-22 (×12): 1 via ORAL
  Filled 2022-10-16 (×14): qty 1

## 2022-10-16 NOTE — Progress Notes (Signed)
Progress Note    Gregory Snyder   [id number]  DOB: 1975/11/03  DOA: 10/11/2022     5 PCP: System, Provider Not In  Initial CC: neck pain  Hospital Course: 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 He has not been using a tracheostomy tube since May last year. He presented with increased neck pain and erythema that started the day of admission. He has had difficulty breathing.   Hx of SCC Cx of the head and neck with tracheostomy stoma here with aspiration pneumonia on room air and cellulitis of neck.  On discharge the patient will need referral to head and neck surgeon in [place] as travelling to [facility] is very difficult for him. [facility] apparently has one that would see him.   Interval History:  No events overnight.  Patient sitting in chair bedside when seen.  Still has chronic neck pain from history of cancer.  Seems that his acute pains have resolved.  Still has right forearm swelling and pain but reassured him ultrasound was negative for infection.  Assessment and Plan: * Aspiration pneumonia ([place]) -stable on room air -continue IV Unasyn -PRN suction of tracheostomy stoma   Cellulitis, neck -neck cellulitis spreading to stoma and to anterior chest  -CT neck soft tissue did not show any abscess  - Blood culture x 2 have had no growth -MRSA screen is positive - okay to d/c vanc (s/p 5 days); continue unasyn -appears improved/resolved  - will plan to finish total of 7 day abx; can de-escalate to PO prior to d/c  History of throat cancer -head and neck s/p rdial neck dissection, laryngectomy, total thyroidectomy, tracheostomy. Reports previously following with ENT at [facility] last year but has not been following up since. - He has been evaluated by ENT. Plan is to continue the Shiley 5 cuffless tube until laryngectomy tube is  available.  -The patient will need to follow up with Head and Neck surgery at [facility] in [place]. He will need referral on discharge.   Hypothyroidism -s/p total thyroidectomy  -has stopped taking medication at least 6 months ago due to financial constraints. Appears to be homeless and living with a friend. -TSH elevated at 56.362 on 10/11/2022. Will check FT3 and FT4. -Patient is receiving levothyroxine 137 mcg daily.  Nodule of skin of right upper extremity -has painful firm nodule on anterior right forearm; has had u/s x 2 which shows mild edema, no nodule or fluid collection  Chronic hepatitis C ([place]) -Has hx of untreated hepatitis C going back to 2022 and never followed up with GI -HCV RNA 1.85 million -needs to establish with primary and GI for treatment    Old records reviewed in assessment of this patient  Antimicrobials: Zosyn 10/11/2022 >> 10/12/2022 Unasyn 10/12/2022 >> current Vancomycin 10/12/2022 >> 10/16/22   DVT prophylaxis:  enoxaparin (LOVENOX) injection 40 mg Start: 10/12/22 0800   Code Status:   Code Status: Full Code  Mobility Assessment (last 72 hours)     Mobility Assessment     Row Name 10/16/22 0718 10/15/22 2112 10/15/22 1500 10/14/22 0921 10/14/22 0800   Does patient have an order for bedrest or is patient medically unstable No - Continue assessment No - Continue assessment -- -- --   What is the highest level of mobility based on the progressive mobility assessment? -- Level 4 (Walks with assist  in room) - Balance while marching in place and cannot step forward and back - Complete Level 5 (Walks with assist in room/hall) - Balance while stepping forward/back and can walk in room with assist - Complete Level 5 (Walks with assist in room/hall) - Balance while stepping forward/back and can walk in room with assist - Complete Level 5 (Walks with assist in room/hall) - Balance while stepping forward/back and can walk in room with assist - Complete    Row  Name 10/13/22 2030           Does patient have an order for bedrest or is patient medically unstable No - Continue assessment       What is the highest level of mobility based on the progressive mobility assessment? Level 4 (Walks with assist in room) - Balance while marching in place and cannot step forward and back - Complete                Barriers to discharge:  Disposition Plan:  Home 1-2 days Status is: Inpt  Objective: Blood pressure 109/68, pulse 66, temperature 98.4 F (36.9 C), temperature source Oral, resp. rate 12, height 5' 9"$  (1.753 m), weight 81.6 kg, SpO2 96 %.  Examination:  Physical Exam Constitutional:      General: He is not in acute distress.    Appearance: Normal appearance.  HENT:     Head: Normocephalic and atraumatic.     Mouth/Throat:     Mouth: Mucous membranes are moist.  Eyes:     Extraocular Movements: Extraocular movements intact.  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     Heart sounds: Normal heart sounds.  Pulmonary:     Effort: Pulmonary effort is normal. No respiratory distress.     Breath sounds: Normal breath sounds. No wheezing.  Abdominal:     General: Bowel sounds are normal. There is no distension.     Palpations: Abdomen is soft.     Tenderness: There is no abdominal tenderness.  Musculoskeletal:        General: Normal range of motion.     Cervical back: Normal range of motion and neck supple.     Comments: Right forearm noted with small ~2cm area that is mildly swollen and TTP but no erythema or drainage noted  Skin:    General: Skin is warm and dry.  Neurological:     General: No focal deficit present.     Mental Status: He is alert.  Psychiatric:        Mood and Affect: Mood normal.        Behavior: Behavior normal.      Consultants:  ENT  Procedures:    Data Reviewed: Results for orders placed or performed during the hospital  encounter of 10/11/22 (from the past 24 hour(s))  CBC     Status: Abnormal   Collection Time: 10/16/22  2:23 AM  Result Value Ref Range   WBC 3.5 (L) 4.0 - 10.5 K/uL   RBC 2.66 (L) 4.22 - 5.81 MIL/uL   Hemoglobin 8.1 (L) 13.0 - 17.0 g/dL   HCT 25.7 (L) 39.0 - 52.0 %   MCV 96.6 80.0 - 100.0 fL   MCH 30.5 26.0 - 34.0 pg   MCHC 31.5 30.0 - 36.0 g/dL   RDW 13.4 11.5 - 15.5 %   Platelets 192 150 - 400 K/uL   nRBC  1.4 (H) 0.0 - 0.2 %  Basic metabolic panel     Status: Abnormal   Collection Time: 10/16/22  2:23 AM  Result Value Ref Range   Sodium 139 135 - 145 mmol/L   Potassium 3.2 (L) 3.5 - 5.1 mmol/L   Chloride 95 (L) 98 - 111 mmol/L   CO2 33 (H) 22 - 32 mmol/L   Glucose, Bld 123 (H) 70 - 99 mg/dL   BUN 7 6 - 20 mg/dL   Creatinine, Ser 0.78 0.61 - 1.24 mg/dL   Calcium 7.5 (L) 8.9 - 10.3 mg/dL   GFR, Estimated >60 >60 mL/min   Anion gap 11 5 - 15    I have reviewed pertinent nursing notes, vitals, labs, and images as necessary. I have ordered labwork to follow up on as indicated.  I have reviewed the last notes from staff over past 24 hours. I have discussed patient's care plan and test results with nursing staff, CM/SW, and other staff as appropriate.  Time spent: Greater than 50% of the 55 minute visit was spent in counseling/coordination of care for the patient as laid out in the A&P.   LOS: 5 days   Dwyane Dee, MD [facility] 10/16/2022, 1:51 PM

## 2022-10-17 DIAGNOSIS — Z85819 Personal history of malignant neoplasm of unspecified site of lip, oral cavity, and pharynx: Secondary | ICD-10-CM | POA: Diagnosis not present

## 2022-10-17 DIAGNOSIS — L03221 Cellulitis of neck: Secondary | ICD-10-CM | POA: Diagnosis not present

## 2022-10-17 DIAGNOSIS — E890 Postprocedural hypothyroidism: Secondary | ICD-10-CM | POA: Diagnosis not present

## 2022-10-17 DIAGNOSIS — J690 Pneumonitis due to inhalation of food and vomit: Secondary | ICD-10-CM | POA: Diagnosis not present

## 2022-10-17 LAB — CBC
HCT: 24.8 % — ABNORMAL LOW (ref 39.0–52.0)
Hemoglobin: 8 g/dL — ABNORMAL LOW (ref 13.0–17.0)
MCH: 30.9 pg (ref 26.0–34.0)
MCHC: 32.3 g/dL (ref 30.0–36.0)
MCV: 95.8 fL (ref 80.0–100.0)
Platelets: 194 10*3/uL (ref 150–400)
RBC: 2.59 MIL/uL — ABNORMAL LOW (ref 4.22–5.81)
RDW: 13.5 % (ref 11.5–15.5)
WBC: 3.4 10*3/uL — ABNORMAL LOW (ref 4.0–10.5)
nRBC: 0.6 % — ABNORMAL HIGH (ref 0.0–0.2)

## 2022-10-17 LAB — BASIC METABOLIC PANEL
Anion gap: 13 (ref 5–15)
BUN: 7 mg/dL (ref 6–20)
CO2: 32 mmol/L (ref 22–32)
Calcium: 7.8 mg/dL — ABNORMAL LOW (ref 8.9–10.3)
Chloride: 93 mmol/L — ABNORMAL LOW (ref 98–111)
Creatinine, Ser: 0.87 mg/dL (ref 0.61–1.24)
GFR, Estimated: >60 mL/min (ref >60)
Glucose, Bld: 96 mg/dL (ref 70–99)
Potassium: 3.5 mmol/L (ref 3.5–5.1)
Sodium: 138 mmol/L (ref 135–145)

## 2022-10-17 MED ORDER — VITAMIN B-12 1000 MCG PO TABS
1000.0000 ug | ORAL_TABLET | Freq: Every day | ORAL | Status: DC
  Administered 2022-10-18 – 2022-10-22 (×5): 1000 ug via ORAL
  Filled 2022-10-17 (×6): qty 1

## 2022-10-17 MED ORDER — FERROUS SULFATE 325 (65 FE) MG PO TABS
325.0000 mg | ORAL_TABLET | Freq: Every day | ORAL | Status: DC
  Administered 2022-10-17 – 2022-10-22 (×6): 325 mg via ORAL
  Filled 2022-10-17 (×6): qty 1

## 2022-10-17 NOTE — Progress Notes (Signed)
Progress Note    Gregory Snyder   [id number]  DOB: 10-Nov-1975  DOA: 10/11/2022     6 PCP: System, Provider Not In  Initial CC: neck pain  Hospital Course: Mr. 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 He has not been using a tracheostomy tube since May last year. He presented with increased neck pain and erythema that started the day of admission. He has had difficulty breathing.   Hx of SCC Cx of the head and neck with tracheostomy stoma here with aspiration pneumonia on room air and cellulitis of neck.  On discharge the patient will need referral to head and neck surgeon in [place] as travelling to [facility] is very difficult for him. [facility] apparently has one that would see him.   Interval History:  No events overnight.  Patient sitting in chair bedside when seen.  Still has chronic neck pain from history of cancer.  Seems that his acute pains have resolved.  Still has right forearm swelling and pain but reassured him ultrasound was negative for infection.  Assessment and Plan: * Aspiration pneumonia ([place]) -stable on room air -continue IV Unasyn -PRN suction of tracheostomy stoma   Cellulitis, neck -neck cellulitis spreading to stoma and to anterior chest  -CT neck soft tissue did not show any abscess  - Blood culture x 2 have had no growth -MRSA screen is positive - okay to d/c vanc (s/p 5 days); continue unasyn -appears improved/resolved  - will plan to finish total of 7 day abx; can de-escalate to PO prior to d/c  History of throat cancer -head and neck s/p rdial neck dissection, laryngectomy, total thyroidectomy, tracheostomy. Reports previously following with ENT at [facility] last year but has not been following up since. - He has been evaluated by ENT. Plan is to continue the Shiley 5 cuffless tube until laryngectomy tube is  available.  -The patient will need to follow up with Head and Neck surgery at [facility] in [place]. He will need referral on discharge.   Hypothyroidism -s/p total thyroidectomy  -has stopped taking medication at least 6 months ago due to financial constraints. Appears to be homeless and living with a friend. -TSH elevated at 56.362 on 10/11/2022.  Free T4 undetectable -Patient is receiving levothyroxine 137 mcg daily. -Discussed importance of compliance at discharge. -Needs repeat TSH and T4 in 4 to 6 weeks  Nodule of skin of right upper extremity -has painful firm nodule on anterior right forearm; has had u/s x 2 which shows mild edema, no nodule or fluid collection  Chronic hepatitis C ([place]) -Has hx of untreated hepatitis C going back to 2022 and never followed up with GI -HCV RNA 1.85 million -needs to establish with primary and GI for treatment    Old records reviewed in assessment of this patient  Antimicrobials: Zosyn 10/11/2022 >> 10/12/2022 Unasyn 10/12/2022 >> current Vancomycin 10/12/2022 >> 10/16/22   DVT prophylaxis:  enoxaparin (LOVENOX) injection 40 mg Start: 10/12/22 0800   Code Status:   Code Status: Full Code  Mobility Assessment (last 72 hours)     Mobility Assessment     Row Name 10/17/22 0800 10/16/22 1950 10/16/22 1541 10/16/22 0718 10/15/22 2112   Does patient have an order for bedrest or is patient medically unstable No - Continue assessment No - Continue assessment No - Continue assessment No - Continue assessment No -  Continue assessment   What is the highest level of mobility based on the progressive mobility assessment? Level 4 (Walks with assist in room) - Balance while marching in place and cannot step forward and back - Complete Level 4 (Walks with assist in room) - Balance while marching in place and cannot step forward and back - Complete Level 5 (Walks with assist in room/hall) - Balance while stepping forward/back and can walk in room with assist  - Complete -- Level 4 (Walks with assist in room) - Balance while marching in place and cannot step forward and back - Complete    Row Name 10/15/22 1500           What is the highest level of mobility based on the progressive mobility assessment? Level 5 (Walks with assist in room/hall) - Balance while stepping forward/back and can walk in room with assist - Complete                Barriers to discharge:  Disposition Plan:  Home 1-2 days Status is: Inpt  Objective: Blood pressure 115/79, pulse 79, temperature 98.3 F (36.8 C), temperature source Oral, resp. rate 16, height 5' 9"$  (1.753 m), weight 81.6 kg, SpO2 96 %.  Examination:  Physical Exam Constitutional:      General: He is not in acute distress.    Appearance: Normal appearance.  HENT:     Head: Normocephalic and atraumatic.     Mouth/Throat:     Mouth: Mucous membranes are moist.  Eyes:     Extraocular Movements: Extraocular movements intact.  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     Heart sounds: Normal heart sounds.  Pulmonary:     Effort: Pulmonary effort is normal. No respiratory distress.     Breath sounds: Normal breath sounds. No wheezing.  Abdominal:     General: Bowel sounds are normal. There is no distension.     Palpations: Abdomen is soft.     Tenderness: There is no abdominal tenderness.  Musculoskeletal:        General: Normal range of motion.     Cervical back: Normal range of motion and neck supple.     Comments: Right forearm noted with small ~2cm area that is mildly swollen and TTP but no erythema or drainage noted  Skin:    General: Skin is warm and dry.  Neurological:     General: No focal deficit present.     Mental Status: He is alert.  Psychiatric:        Mood and Affect: Mood normal.        Behavior: Behavior normal.      Consultants:  ENT  Procedures:    Data Reviewed: Results for orders  placed or performed during the hospital encounter of 10/11/22 (from the past 24 hour(s))  CBC     Status: Abnormal   Collection Time: 10/17/22  2:41 AM  Result Value Ref Range   WBC 3.4 (L) 4.0 - 10.5 K/uL   RBC 2.59 (L) 4.22 - 5.81 MIL/uL   Hemoglobin 8.0 (L) 13.0 - 17.0 g/dL   HCT 24.8 (L) 39.0 - 52.0 %   MCV 95.8 80.0 - 100.0 fL   MCH 30.9 26.0 - 34.0 pg   MCHC 32.3 30.0 - 36.0 g/dL   RDW 13.5 11.5 - 15.5 %   Platelets 194 150 - 400 K/uL  nRBC 0.6 (H) 0.0 - 0.2 %  Basic metabolic panel     Status: Abnormal   Collection Time: 10/17/22  2:41 AM  Result Value Ref Range   Sodium 138 135 - 145 mmol/L   Potassium 3.5 3.5 - 5.1 mmol/L   Chloride 93 (L) 98 - 111 mmol/L   CO2 32 22 - 32 mmol/L   Glucose, Bld 96 70 - 99 mg/dL   BUN 7 6 - 20 mg/dL   Creatinine, Ser 0.87 0.61 - 1.24 mg/dL   Calcium 7.8 (L) 8.9 - 10.3 mg/dL   GFR, Estimated >60 >60 mL/min   Anion gap 13 5 - 15    I have reviewed pertinent nursing notes, vitals, labs, and images as necessary. I have ordered labwork to follow up on as indicated.  I have reviewed the last notes from staff over past 24 hours. I have discussed patient's care plan and test results with nursing staff, CM/SW, and other staff as appropriate.  Time spent: Greater than 50% of the 55 minute visit was spent in counseling/coordination of care for the patient as laid out in the A&P.   LOS: 6 days   Dwyane Dee, MD [facility] 10/17/2022, 12:55 PM

## 2022-10-17 NOTE — Progress Notes (Signed)
Mobility Specialist Progress Note   10/17/22 1318  Mobility  Activity Ambulated with assistance in hallway  Level of Assistance Modified independent, requires aide device or extra time  Assistive Device [facility] Ambulated (ft) 600 ft  Range of Motion/Exercises Active;All extremities  Activity Response Tolerated well   Patient received in supine and agreeable to participate. Ambulated mod I with cane and slow steady gait. Returned to room without complaint or incident. Was left in recliner with all needs met, call bell in reach.   Gregory Snyder, BS EXP Mobility Specialist Please contact via SecureChat or Rehab office at [phone]

## 2022-10-18 DIAGNOSIS — L03221 Cellulitis of neck: Secondary | ICD-10-CM | POA: Diagnosis not present

## 2022-10-18 DIAGNOSIS — J690 Pneumonitis due to inhalation of food and vomit: Secondary | ICD-10-CM | POA: Diagnosis not present

## 2022-10-18 DIAGNOSIS — Z85819 Personal history of malignant neoplasm of unspecified site of lip, oral cavity, and pharynx: Secondary | ICD-10-CM | POA: Diagnosis not present

## 2022-10-18 DIAGNOSIS — E890 Postprocedural hypothyroidism: Secondary | ICD-10-CM | POA: Diagnosis not present

## 2022-10-18 MED ORDER — ORAL CARE MOUTH RINSE: 15.0000 mL | OROMUCOSAL | Status: DC | PRN

## 2022-10-18 MED ORDER — ORAL CARE MOUTH RINSE
15.0000 mL | OROMUCOSAL | Status: DC
  Administered 2022-10-18 – 2022-10-22 (×15): 15 mL via OROMUCOSAL

## 2022-10-18 NOTE — Progress Notes (Signed)
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 He has not been using a tracheostomy tube since May last year. He presented with increased neck pain and erythema that started the day of admission. He has had difficulty breathing.   Hx of SCC Cx of the head and neck with tracheostomy stoma here with aspiration pneumonia on room air and cellulitis of neck.  On discharge the patient will need referral to head and neck surgeon in [place] as travelling to [facility] is very difficult for him. [facility] apparently has one that would see him.   Interval History:  No events overnight. Resting in bed comfortably. No physical concerns this morning. Mostly still worried about all the social aspects at discharge, which I tried reassuring him that we're working on.   Assessment and Plan: * Aspiration pneumonia ([place]) -stable on room air -continue IV Unasyn -PRN suction of tracheostomy stoma   Cellulitis, neck -neck cellulitis spreading to stoma and to anterior chest  -CT neck soft tissue did not show any abscess  - Blood culture x 2 have had no growth -MRSA screen is positive - okay to d/c vanc (s/p 5 days); completed total of 7-day course of Unasyn as well -appears improved/resolved  History of throat cancer -head and neck s/p rdial neck dissection, laryngectomy, total thyroidectomy, tracheostomy. Reports previously following with ENT at [facility] last year but has not been following up since. - He has been evaluated by ENT. Plan is to continue the Shiley 5 cuffless tube until laryngectomy tube is available.  -The patient will need to follow up with Head and Neck surgery at [facility] in [place]. He  will need referral on discharge.   Hypothyroidism -s/p total thyroidectomy  -has stopped taking medication at least 6 months ago due to financial constraints. Appears to be homeless and living with a friend. -TSH elevated at 56.362 on 10/11/2022.  Free T4 undetectable -Patient is receiving levothyroxine 137 mcg daily. -Discussed importance of compliance at discharge. -Needs repeat TSH and T4 in 4 to 6 weeks  Nodule of skin of right upper extremity -has painful firm nodule on anterior right forearm; has had u/s x 2 which shows mild edema, no nodule or fluid collection  Chronic hepatitis C ([place]) -Has hx of untreated hepatitis C going back to 2022 and never followed up with GI -HCV RNA 1.85 million -needs to establish with primary and GI for treatment    Old records reviewed in assessment of this patient  Antimicrobials: Zosyn 10/11/2022 >> 10/12/2022 Unasyn 10/12/2022 >> 10/18/22 Vancomycin 10/12/2022 >> 10/16/22   DVT prophylaxis:  enoxaparin (LOVENOX) injection 40 mg Start: 10/12/22 0800   Code Status:   Code Status: Full Code  Mobility Assessment (last 72 hours)     Mobility Assessment     Row Name 10/17/22 1948 10/17/22 0800 10/16/22 1950 10/16/22 1541 10/16/22 0718   Does patient have an order for bedrest or is patient medically unstable No - Continue assessment No - Continue assessment No - Continue assessment No - Continue assessment No - Continue assessment   What is the highest level of mobility based on the progressive mobility assessment? Level 4 (Walks with assist in  room) - Balance while marching in place and cannot step forward and back - Complete Level 4 (Walks with assist in room) - Balance while marching in place and cannot step forward and back - Complete Level 4 (Walks with assist in room) - Balance while marching in place and cannot step forward and back - Complete Level 5 (Walks with assist in room/hall) - Balance while stepping forward/back and can walk in room  with assist - Complete --    Row Name 10/15/22 2112           Does patient have an order for bedrest or is patient medically unstable No - Continue assessment       What is the highest level of mobility based on the progressive mobility assessment? Level 4 (Walks with assist in room) - Balance while marching in place and cannot step forward and back - Complete                Barriers to discharge:  Disposition Plan:  Home 1-2 days Status is: Inpt  Objective: Blood pressure (!) 97/58, pulse 84, temperature 98.2 F (36.8 C), resp. rate 18, height 5' 9"$  (1.753 m), weight 81.6 kg, SpO2 96 %.  Examination:  Physical Exam Constitutional:      General: He is not in acute distress.    Appearance: Normal appearance.  HENT:     Head: Normocephalic and atraumatic.     Mouth/Throat:     Mouth: Mucous membranes are moist.  Eyes:     Extraocular Movements: Extraocular movements intact.  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     Heart sounds: Normal heart sounds.  Pulmonary:     Effort: Pulmonary effort is normal. No respiratory distress.     Breath sounds: Normal breath sounds. No wheezing.  Abdominal:     General: Bowel sounds are normal. There is no distension.     Palpations: Abdomen is soft.     Tenderness: There is no abdominal tenderness.  Musculoskeletal:        General: Normal range of motion.     Cervical back: Normal range of motion and neck supple.     Comments: Right forearm noted with small ~2cm area that is mildly swollen and TTP but no erythema or drainage noted  Skin:    General: Skin is warm and dry.  Neurological:     General: No focal deficit present.     Mental Status: He is alert.  Psychiatric:        Mood and Affect: Mood normal.        Behavior: Behavior normal.      Consultants:  ENT  Procedures:    Data Reviewed: No results found for this or any previous visit  (from the past 24 hour(s)).   I have reviewed pertinent nursing notes, vitals, labs, and images as necessary. I have ordered labwork to follow up on as indicated.  I have reviewed the last notes from staff over past 24 hours. I have discussed patient's care plan and test results with nursing staff, CM/SW, and other staff as appropriate.  Time spent: Greater than 50% of the 55 minute visit was spent in counseling/coordination of care for the patient as laid out in the A&P.   LOS: 7 days   Dwyane Dee, MD [facility] 10/18/2022, 3:00 PM

## 2022-10-18 NOTE — Progress Notes (Signed)
Speech Language Pathology Treatment: Cognitive-Linquistic  Patient Details Name: Gregory Snyder MRN: [id number] DOB: 1975-11-07 Today's Date: 10/18/2022 Time: [id number] SLP Time Calculation (min) (ACUTE ONLY): 14 min  Assessment / Plan / Recommendation Clinical Impression  Pt has his lary tube placed but not his HME. He said he took his HME off for suction and had just forgotten to replace it, so he donned it himself. He said he has been using his HMEs regularly and cleaning his lary tube, planning to do so again this afternoon. He is using his electrolarynx throughout our session with minimal repetitions needed for intelligibility. Education was also provided briefly about adjusting volume/frequency to help with his intelligibility PRN. Pt said he was able to use it over the phone with TOC to work on resources needed at d/c, but that he did need some assist for them to be able to understand him. Over the phone is likely to be the most challenging. He would benefit from video chat whenever possible, not only for listener to benefit from lip reading but also because he naturally provides some additional gestural cues, although know that this is not always an option. Updates were given to pt about work towards getting him additional supplies at discharge for stoma care/communication. He will need a clean place to receive, store, and regularly use his supplies. Will also need OP SLP follow up. Appreciate assistance from [facility] in facilitating this.   HPI HPI: Patient is a 47 y.o. 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 He presented to the hospital on 10/11/22 with increase in neck swelling. He previously had TEP placed in May of 2023 but has not followed up with ENT at [facility] for at least a year and has not been taking any of his medications. ENT at [facility] evaluated patient and removed obstructive mucous plug from stoma, debrided with  saline and placed a Shiley #4 cuffless tube to keep stoma open and recommended ordering laryngectomy tubes and HME's prior to discharge. There is concern that patient is having leaking from his TEP which could be leading to aspiration.      SLP Plan  Continue with current plan of care      Recommendations for follow up therapy are one component of a multi-disciplinary discharge planning process, led by the attending physician.  Recommendations may be updated based on patient status, additional functional criteria and insurance authorization.    Recommendations                   Follow Up Recommendations: Outpatient SLP Assistance recommended at discharge: PRN SLP Visit Diagnosis: Aphonia (R49.1) Plan: Continue with current plan of care           Osie Bond., M.A. [place] Office [phone]  Secure chat preferred   10/18/2022, 4:00 PM

## 2022-10-18 NOTE — Progress Notes (Signed)
Mobility Specialist Progress Note   10/18/22 1200  Mobility  Activity Ambulated with assistance in hallway  Level of Assistance Modified independent, requires aide device or extra time  Assistive Device [facility] Ambulated (ft) 720 ft  Activity Response Tolerated well  Mobility Referral Yes  $Mobility charge 1 Mobility   Pre Mobility: 97% SpO2 on RA  During Mobility: 93% SpO2 on RA  Post Mobility: 96% SpO2  Patient received in bed c/o R jaw and neck discomfort but agreeable to participate in mobility. Ambulated in hallway w/ a cane having a steady gait. Returned to room without complaint or incident. Was left in bed with all needs met and call bell in reach.   Holland Falling Mobility Specialist Please contact via SecureChat or  Rehab office at [phone]

## 2022-10-19 DIAGNOSIS — E890 Postprocedural hypothyroidism: Secondary | ICD-10-CM | POA: Diagnosis not present

## 2022-10-19 DIAGNOSIS — J690 Pneumonitis due to inhalation of food and vomit: Secondary | ICD-10-CM | POA: Diagnosis not present

## 2022-10-19 DIAGNOSIS — L03221 Cellulitis of neck: Secondary | ICD-10-CM | POA: Diagnosis not present

## 2022-10-19 DIAGNOSIS — Z85819 Personal history of malignant neoplasm of unspecified site of lip, oral cavity, and pharynx: Secondary | ICD-10-CM | POA: Diagnosis not present

## 2022-10-19 NOTE — Progress Notes (Signed)
Physical Therapy Treatment Patient Details Name: Gregory Snyder MRN: [id number] DOB: [birth year] Today's Date: 10/19/2022   History of Present Illness 47 year old admitted 2/12 with aspiration PNA, Mucus Plug, cellulitis of the neck. 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    PT Comments    Pt greeted semi-reclined in bed and agreeable to session with continued progress towards acute goals. Pt demonstrating increased tolerance for gait with SPC this date at supervision level, with minimal cues needed for safety with pt showing good carryover for AD sequencing. Pt with no overt LOB noted. Pt with mild c/o cervical pain with with rotation, encouraged gentle ROM within tolerance with pt able to demonstrate back with no increase in pain. Pt continues to benefit from skilled PT services to progress toward functional mobility goals.    Recommendations for follow up therapy are one component of a multi-disciplinary discharge planning process, led by the attending physician.  Recommendations may be updated based on patient status, additional functional criteria and insurance authorization.  Follow Up Recommendations  No PT follow up     Assistance Recommended at Discharge None  Patient can return home with the following Assist for transportation   Equipment Recommendations  Cane    Recommendations for Other Services       Precautions / Restrictions Precautions Precautions: Other (comment) Precaution Comments: monitor O2 Restrictions Weight Bearing Restrictions: No     Mobility  Bed Mobility Overal bed mobility: Modified Independent             General bed mobility comments: no assist needed    Transfers Overall transfer level: Modified independent Equipment used: Straight cane               General transfer comment: stable with AD    Ambulation/Gait Ambulation/Gait assistance:  Supervision Gait Distance (Feet): 550 Feet Assistive device: Straight cane Gait Pattern/deviations: Step-through pattern, Decreased stride length Gait velocity: decr     General Gait Details: Minimal instability noted with AD for light support, good carryover for [facility] sequencing   Stairs             Wheelchair Mobility    Modified Rankin (Stroke Patients Only)       Balance Overall balance assessment: Needs assistance Sitting-balance support: No upper extremity supported, Feet supported Sitting balance-Leahy Scale: Normal     Standing balance support: No upper extremity supported, During functional activity Standing balance-Leahy Scale: Good                              Cognition Arousal/Alertness: Awake/alert Behavior During Therapy: WFL for tasks assessed/performed Overall Cognitive Status: Within Functional Limits for tasks assessed                                          Exercises      General Comments General comments (skin integrity, edema, etc.): VSS on RA      Pertinent Vitals/Pain Pain Assessment Pain Assessment: Faces Faces Pain Scale: Hurts a little bit Pain Location: neck with rotation Pain Descriptors / Indicators: Sore Pain Intervention(s): Monitored during session, Limited activity within patient's tolerance    Home Living                          Prior  Function            PT Goals (current goals can now be found in the care plan section) Acute Rehab PT Goals PT Goal Formulation: With patient Time For Goal Achievement: 10/28/22 Progress towards PT goals: Progressing toward goals    Frequency    Min 3X/week      PT Plan Current plan remains appropriate    Co-evaluation              AM-PAC PT "6 Clicks" Mobility   Outcome Measure  Help needed turning from your back to your side while in a flat bed without using bedrails?: None Help needed moving from lying on your back to  sitting on the side of a flat bed without using bedrails?: None Help needed moving to and from a bed to a chair (including a wheelchair)?: None Help needed standing up from a chair using your arms (e.g., wheelchair or bedside chair)?: None Help needed to walk in hospital room?: A Little Help needed climbing 3-5 steps with a railing? : A Little 6 Click Score: 22    End of Session   Activity Tolerance: Patient tolerated treatment well Patient left: in bed;with call bell/phone within reach (Staff entering room) Nurse Communication: Mobility status PT Visit Diagnosis: Unsteadiness on feet (R26.81);Other abnormalities of gait and mobility (R26.89)     Time: [id number] PT Time Calculation (min) (ACUTE ONLY): 14 min  Charges:  $Gait Training: 8-22 mins                     Lilah Mijangos R. PTA Acute Rehabilitation Services Office: [place] 10/19/2022, 11:06 AM

## 2022-10-19 NOTE — TOC Progression Note (Signed)
Transition of Care [facility]) - Initial/Assessment Note    Patient Details  Name: Gregory Snyder MRN: [id number] Date of Birth: 1976/02/14  Transition of Care [facility]) CM/SW Contact:    Milinda Antis, [place] Phone Number: 10/19/2022, 9:46 AM  Clinical Narrative:                 LCSW assisted the patient with calling [facility] to update address and receive a new EBT card.    TOC following and waiting for information from [place] in reference to possible housing assistance.      Barriers to Discharge: Continued Medical Work up, Homeless with medical needs   Patient Goals and CMS Choice Patient states their goals for this hospitalization and ongoing recovery are:: To find a stable home CMS Medicare.gov Compare Post Acute Care list provided to:: Patient Choice offered to / list presented to : Patient      Expected Discharge Plan and Services In-house Referral: Clinical Social Work, PCP / Psychologist, educational Discharge Planning Services: CM Consult, Medication Assistance   Living arrangements for the past 2 months: Homeless                                      Prior Living Arrangements/Services Living arrangements for the past 2 months: Homeless Lives with:: Self Patient language and need for interpreter reviewed:: Yes Do you feel safe going back to the place where you live?: Yes      Need for Family Participation in Patient Care: Yes (Comment) Care giver support system in place?: Yes (comment)   Criminal Activity/Legal Involvement Pertinent to Current Situation/Hospitalization: No - Comment as needed  Activities of Daily Living Home Assistive Devices/Equipment: None ADL Screening (condition at time of admission) Patient's cognitive ability adequate to safely complete daily activities?: Yes Is the patient deaf or have difficulty hearing?: No Does the patient have difficulty seeing, even when wearing glasses/contacts?: No Does the patient have difficulty  concentrating, remembering, or making decisions?: No Patient able to express need for assistance with ADLs?: No Does the patient have difficulty dressing or bathing?: No Independently performs ADLs?: Yes (appropriate for developmental age) Does the patient have difficulty walking or climbing stairs?: No Weakness of Legs: Both Weakness of Arms/Hands: None  Permission Sought/Granted Permission sought to share information with : Case Manager, Customer service manager, Family Supports Permission granted to share information with : Yes, Verbal Permission Granted              Emotional Assessment Appearance:: Appears stated age Attitude/Demeanor/Rapport: Engaged, Gracious Affect (typically observed): Accepting, Appropriate, Calm, Hopeful, Pleasant Orientation: : Oriented to Self, Oriented to Place, Oriented to  Time, Oriented to Situation Alcohol / Substance Use: Alcohol Use Psych Involvement: No (comment)  Admission diagnosis:  Aspiration pneumonia ([place]) [J69.0] Tracheitis [J04.10] Aspiration pneumonia of left upper lobe, unspecified aspiration pneumonia type ([place]) [J69.0] Patient Active Problem List   Diagnosis Date Noted   Normocytic anemia 10/16/2022   Aspiration pneumonia ([place]) 10/11/2022   Cellulitis, neck 10/11/2022   History of throat cancer 10/11/2022   Hypothyroidism 10/11/2022   Chronic hepatitis C ([place]) 10/11/2022   Nodule of skin of right upper extremity 10/11/2022   Peripheral neuropathy 01/22/2022   Aspiration pneumonitis ([place])    Edema    Cellulitis 01/13/2022   Ludwig's angina    Throat cancer ([place])    Tongue swelling    PCP:  System, Provider  Not In Pharmacy:   [facility] [address] [place] [address] Phone: [phone] Fax: [phone]     Social Determinants of Health (SDOH) Social History: [place]: Low Risk  (10/12/2022)  Tobacco Use: Medium Risk (10/11/2022)   SDOH  Interventions:     Readmission Risk Interventions     No data to display

## 2022-10-19 NOTE — Progress Notes (Signed)
Progress Note    Gregory Snyder   [id number]  DOB: June 26, 1976  DOA: 10/11/2022     8 PCP: Gregory Snyder, Provider Not In  Initial CC: neck pain  Hospital Course: Mr. Gregory Snyder is a 47 year old male with PMH SCC of the head and neck s/p radical neck dissection, laryngectomy, total thyroidectomy, tracheostomy who presented with increased neck swelling. He has not been using a tracheostomy tube since May last year. He presented with increased neck pain and erythema that started the day of admission. He has had difficulty breathing.   Hx of SCC Cx of the head and neck with tracheostomy stoma here with aspiration pneumonia on room air and cellulitis of neck.  Interval History:  No events overnight. Doing okay still this morning. Supplies and logistics for discharge still being arranged.   Assessment and Plan: * Aspiration pneumonia (HCC)-resolved as of 10/19/2022 -stable on room air -Unasyn course completed inpatient -PRN suction of tracheostomy stoma   Cellulitis, neck-resolved as of 10/19/2022 -neck cellulitis spreading to stoma and to anterior chest  -CT neck soft tissue did not show any abscess  - Blood culture x 2 have had no growth -MRSA screen is positive - okay to d/c vanc (s/p 5 days); completed total of 7-day course of Unasyn as well -appears improved/resolved  History of throat cancer -head and neck s/p rdial neck dissection, laryngectomy, total thyroidectomy, tracheostomy. Reports previously following with ENT at [facility] last year but has not been following up since. - He has been evaluated by ENT. Plan is to continue the Shiley cuffless tube until laryngectomy tube is available/ordered - he is recommended to follow up with ENT every 3-6 months; will discuss with ENT regarding if he can establish care in [facility] since unable to get to [facility] due to financial/social constraints   Hypothyroidism -s/p total thyroidectomy  -has stopped taking medication at least 6 months ago due  to financial constraints. Appears to be homeless and living with a friend. -TSH elevated at 56.362 on 10/11/2022.  Free T4 undetectable -Patient is receiving levothyroxine 137 mcg daily. -Discussed importance of compliance at discharge. -Needs repeat TSH and T4 in 4 to 6 weeks  Nodule of skin of right upper extremity -has painful firm nodule on anterior right forearm; has had u/s x 2 which shows mild edema, no nodule or fluid collection  Chronic hepatitis C ([place]) -Has hx of untreated hepatitis C going back to 2022 and never followed up with GI -HCV RNA 1.85 million -needs to establish with primary and GI for treatment; TOC assisting with PCP at discharge    Old records reviewed in assessment of this patient  Antimicrobials: Zosyn 10/11/2022 >> 10/12/2022 Unasyn 10/12/2022 >> 10/18/22 Vancomycin 10/12/2022 >> 10/16/22   DVT prophylaxis:  enoxaparin (LOVENOX) injection 40 mg Start: 10/12/22 0800   Code Status:   Code Status: Full Code  Mobility Assessment (last 72 hours)     Mobility Assessment     Row Name 10/19/22 1100 10/18/22 2000 10/17/22 1948 10/17/22 0800 10/16/22 1950   Does patient have an order for bedrest or is patient medically unstable -- No - Continue assessment No - Continue assessment No - Continue assessment No - Continue assessment   What is the highest level of mobility based on the progressive mobility assessment? Level 5 (Walks with assist in room/hall) - Balance while stepping forward/back and can walk in room with assist - Complete Level 4 (Walks with assist in room) - Balance while marching in place and cannot  step forward and back - Complete Level 4 (Walks with assist in room) - Balance while marching in place and cannot step forward and back - Complete Level 4 (Walks with assist in room) - Balance while marching in place and cannot step forward and back - Complete Level 4 (Walks with assist in room) - Balance while marching in place and cannot step forward and back  - Complete    Row Name 10/16/22 1541           Does patient have an order for bedrest or is patient medically unstable No - Continue assessment       What is the highest level of mobility based on the progressive mobility assessment? Level 5 (Walks with assist in room/hall) - Balance while stepping forward/back and can walk in room with assist - Complete                Barriers to discharge:  Disposition Plan:  Home 1-2 days Status is: Inpt  Objective: Blood pressure 103/69, pulse 74, temperature 97.9 F (36.6 C), temperature source Oral, resp. rate 18, height 5' 9"$  (1.753 m), weight 81.6 kg, SpO2 98 %.  Examination:  Physical Exam Constitutional:      General: He is not in acute distress.    Appearance: Normal appearance.  HENT:     Head: Normocephalic and atraumatic.     Mouth/Throat:     Mouth: Mucous membranes are moist.  Eyes:     Extraocular Movements: Extraocular movements intact.  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     Heart sounds: Normal heart sounds.  Pulmonary:     Effort: Pulmonary effort is normal. No respiratory distress.     Breath sounds: Normal breath sounds. No wheezing.  Abdominal:     General: Bowel sounds are normal. There is no distension.     Palpations: Abdomen is soft.     Tenderness: There is no abdominal tenderness.  Musculoskeletal:        General: Normal range of motion.     Cervical back: Normal range of motion and neck supple.     Comments: Right forearm noted with small ~2cm area that is mildly swollen and TTP but no erythema or drainage noted  Skin:    General: Skin is warm and dry.  Neurological:     General: No focal deficit present.     Mental Status: He is alert.  Psychiatric:        Mood and Affect: Mood normal.        Behavior: Behavior normal.      Consultants:  ENT  Procedures:    Data Reviewed: No results found for this or any  previous visit (from the past 24 hour(s)).   I have reviewed pertinent nursing notes, vitals, labs, and images as necessary. I have ordered labwork to follow up on as indicated.  I have reviewed the last notes from staff over past 24 hours. I have discussed patient's care plan and test results with nursing staff, CM/SW, and other staff as appropriate.  Time spent: Greater than 50% of the 55 minute visit was spent in counseling/coordination of care for the patient as laid out in the A&P.   LOS: 8 days   Dwyane Dee, MD [facility] 10/19/2022, 1:48 PM

## 2022-10-20 ENCOUNTER — Inpatient Hospital Stay (HOSPITAL_COMMUNITY): Payer: Medicaid Other

## 2022-10-20 DIAGNOSIS — J690 Pneumonitis due to inhalation of food and vomit: Secondary | ICD-10-CM | POA: Diagnosis not present

## 2022-10-20 NOTE — Progress Notes (Addendum)
Mobility Specialist Progress Note   10/20/22 1440  Mobility  Activity Ambulated with assistance in hallway  Level of Assistance Modified independent, requires aide device or extra time  Assistive Device [facility] Ambulated (ft) 720 ft  Activity Response Tolerated well  Mobility Referral Yes  $Mobility charge 1 Mobility   Patient received at EOB agreeable to participate in mobility. Ambulated in hallway at a mod I level accompanied by a steady gait. Returned to room having c/o R shoulder pain but no incidents or faults present. Was left at EOB with all needs met, call bell in reach.   Holland Falling Mobility Specialist Please contact via SecureChat or  Rehab office at [phone]

## 2022-10-20 NOTE — Progress Notes (Signed)
[facility] Progress Note Patient: Gregory Snyder [id number] DOB: [birth year] DOA: 10/11/2022  DOS: the patient was seen and examined on 10/20/2022  Brief hospital course: Gregory Snyder is a 47 year old male with PMH SCC of the head and neck s/p radical neck dissection, laryngectomy, total thyroidectomy, tracheostomy who presented with increased neck swelling. He has not been using a tracheostomy tube since May last year. He presented with increased neck pain and erythema that started the day of admission. He has had difficulty breathing.   Hx of SCC Cx of the head and neck with tracheostomy stoma here with aspiration pneumonia on room air and cellulitis of neck. Assessment and Plan: Aspiration pneumonia  Stable on room air Unasyn course completed inpatient PRN suction of tracheostomy stoma    Cellulitis, neck neck cellulitis spreading to stoma and to anterior chest  CT neck soft tissue did not show any abscess  Blood culture x 2 have had no growth MRSA screen is positive   History of throat cancer head and neck s/p rdial neck dissection, laryngectomy, total thyroidectomy, tracheostomy. Reports previously following with ENT at [facility] last year but has not been following up since.  He has been evaluated by ENT. Plan is to continue the Shiley cuffless tube until laryngectomy tube is available/ordered he is recommended to follow up with ENT every 3-6 months   Hypothyroidism s/p total thyroidectomy  has stopped taking medication at least 6 months ago due to financial constraints. Appears to be homeless and living with a friend. TSH elevated at 56.362 on 10/11/2022.  Free T4 undetectable Patient is receiving levothyroxine 137 mcg daily. Discussed importance of compliance at discharge. Needs repeat TSH and T4 in 4 to 6 weeks   Nodule of skin of right upper extremity has painful firm nodule on anterior right forearm; has had u/s x 2 which shows mild edema, no nodule or fluid  collection   Chronic hepatitis C ([place]) Has hx of untreated hepatitis C going back to 2022 and never followed up with GI HCV RNA 1.85 million needs to establish with primary and GI for treatment; TOC assisting with PCP at discharge   Right shoulder pain. Will get an x-ray and monitor report. Continue pain control.   Subjective: Reports shoulder pain.  No nausea no vomiting no fever no chills.  Continues to have some cough.  Physical Exam: General: in Mild distress, No Rash Cardiovascular: S1 and S2 Present, No Murmur Respiratory: Good respiratory effort, Bilateral Air entry present. No Crackles, No wheezes Abdomen: Bowel Sound present, No tenderness Extremities: No edema Neuro: Alert and oriented x3, no new focal deficit  Data Reviewed: I have Reviewed nursing notes, Vitals, and Lab results. Since last encounter, pertinent lab results CBC and BMP   . I have ordered test including CBC and BMP  . I have ordered imaging x-ray shoulder  .   Disposition: Status is: Inpatient Remains inpatient appropriate because: Need to establish living arrangements as well as DME's. enoxaparin (LOVENOX) injection 40 mg Start: 10/12/22 0800   Family Communication: No one at bedside Level of care: Med-Surg  Vitals:   10/20/22 0839 10/20/22 1126 10/20/22 1554 10/20/22 1659  BP:    120/77  Pulse: 68 71 68 76  Resp: 16 18 18 18  $ Temp:    98.3 F (36.8 C)  TempSrc:    Oral  SpO2: 98% 98% 98% 100%  Weight:      Height:         Author: Berle Mull, MD  10/20/2022 8:02 PM  Please look on [email or web address] to find out who is on call.

## 2022-10-20 NOTE — Progress Notes (Signed)
Physical Therapy Treatment Patient Details Name: Yuval Mahajan MRN: [id number] DOB: [birth year] Today's Date: 10/20/2022   History of Present Illness 47 year old admitted 2/12 with aspiration PNA, Mucus Plug, cellulitis of the neck. 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    PT Comments    Pt with continued progress towards acute goals with session focused on gait with balance challenges for improved balance/postural reactions. Pt grossly supervision for gait with SPC on R for support, however pt requiring up to min assist during dynamic stepping challenges for steadying assist. Pt able to complete all without overt LOB however pt with noted instability with lateral head turns and braiding stepping to R. Pt continues to benefit from skilled PT services to progress toward functional mobility goals.    Recommendations for follow up therapy are one component of a multi-disciplinary discharge planning process, led by the attending physician.  Recommendations may be updated based on patient status, additional functional criteria and insurance authorization.  Follow Up Recommendations  No PT follow up     Assistance Recommended at Discharge None  Patient can return home with the following Assist for transportation   Equipment Recommendations  Cane    Recommendations for Other Services       Precautions / Restrictions Precautions Precautions: Other (comment) Precaution Comments: monitor O2 Restrictions Weight Bearing Restrictions: No     Mobility  Bed Mobility Overal bed mobility: Modified Independent             General bed mobility comments: no assist needed    Transfers Overall transfer level: Modified independent Equipment used: Straight cane               General transfer comment: stable with AD    Ambulation/Gait Ambulation/Gait assistance: Supervision Gait Distance (Feet): 550  Feet Assistive device: Straight cane Gait Pattern/deviations: Step-through pattern, Decreased stride length Gait velocity: decr     General Gait Details: Minimal instability noted with AD for light support, good carryover for SPC sequencing, focus of gait on balance challenges throughout   Stairs             Wheelchair Mobility    Modified Rankin (Stroke Patients Only)       Balance Overall balance assessment: Needs assistance Sitting-balance support: No upper extremity supported, Feet supported Sitting balance-Leahy Scale: Normal     Standing balance support: No upper extremity supported, During functional activity Standing balance-Leahy Scale: Good Standing balance comment: no overt LOB during dynamic stepping tasks             High level balance activites: Side stepping, Braiding, Backward walking, Direction changes, Head turns High Level Balance Comments: pt with no overt LOB during stepping challenges, noted significant decrease in gait speed during retro stepping, pt nedding slight stedying assist when braiding stepping to R, more stable to L, mild instability with head turne R/L with pt able to self correct, good stability with vertical head turns            Cognition Arousal/Alertness: Awake/alert Behavior During Therapy: WFL for tasks assessed/performed Overall Cognitive Status: Within Functional Limits for tasks assessed                                          Exercises      General Comments General comments (skin integrity, edema, etc.): VSS on RA  Pertinent Vitals/Pain Pain Assessment Pain Assessment: Faces Faces Pain Scale: Hurts a little bit Breathing: normal Negative Vocalization: none Facial Expression: smiling or inexpressive Body Language: relaxed Consolability: no need to console PAINAD Score: 0 Pain Location: neck with rotation, LLE Pain Descriptors / Indicators: Sore Pain Intervention(s): Monitored  during session, Limited activity within patient's tolerance    Home Living                          Prior Function            PT Goals (current goals can now be found in the care plan section) Acute Rehab PT Goals PT Goal Formulation: With patient Time For Goal Achievement: 10/28/22 Progress towards PT goals: Progressing toward goals    Frequency    Min 3X/week      PT Plan Current plan remains appropriate    Co-evaluation              AM-PAC PT "6 Clicks" Mobility   Outcome Measure  Help needed turning from your back to your side while in a flat bed without using bedrails?: None Help needed moving from lying on your back to sitting on the side of a flat bed without using bedrails?: None Help needed moving to and from a bed to a chair (including a wheelchair)?: None Help needed standing up from a chair using your arms (e.g., wheelchair or bedside chair)?: None Help needed to walk in hospital room?: A Little Help needed climbing 3-5 steps with a railing? : A Little 6 Click Score: 22    End of Session   Activity Tolerance: Patient tolerated treatment well Patient left: in bed;with call bell/phone within reach Nurse Communication: Mobility status PT Visit Diagnosis: Unsteadiness on feet (R26.81);Other abnormalities of gait and mobility (R26.89)     Time: [id number] PT Time Calculation (min) (ACUTE ONLY): 17 min  Charges:  $Therapeutic Activity: 8-22 mins                     Neithan Day R. PTA Acute Rehabilitation Services Office: [place] 10/20/2022, 9:03 AM

## 2022-10-21 ENCOUNTER — Other Ambulatory Visit (HOSPITAL_COMMUNITY): Payer: Self-pay

## 2022-10-21 MED ORDER — METHOCARBAMOL 500 MG PO TABS
500.0000 mg | ORAL_TABLET | Freq: Three times a day (TID) | ORAL | Status: DC
  Administered 2022-10-21 – 2022-10-22 (×4): 500 mg via ORAL
  Filled 2022-10-21 (×4): qty 1

## 2022-10-21 MED ORDER — FERROUS SULFATE 325 (65 FE) MG PO TABS
325.0000 mg | ORAL_TABLET | Freq: Every day | ORAL | 0 refills | Status: AC
  Filled 2022-10-21: qty 120, 120d supply, fill #0

## 2022-10-21 MED ORDER — LEVOTHYROXINE SODIUM 137 MCG PO TABS
137.0000 ug | ORAL_TABLET | Freq: Every day | ORAL | 0 refills | Status: AC
  Filled 2022-10-21: qty 90, 90d supply, fill #0

## 2022-10-21 MED ORDER — GABAPENTIN 100 MG PO CAPS
100.0000 mg | ORAL_CAPSULE | Freq: Three times a day (TID) | ORAL | 0 refills | Status: AC
  Filled 2022-10-21: qty 90, 30d supply, fill #0

## 2022-10-21 MED ORDER — METHOCARBAMOL 500 MG PO TABS
500.0000 mg | ORAL_TABLET | Freq: Three times a day (TID) | ORAL | 0 refills | Status: AC
  Filled 2022-10-21: qty 30, 10d supply, fill #0

## 2022-10-21 MED ORDER — POLYETHYLENE GLYCOL 3350 17 GM/SCOOP PO POWD
17.0000 g | Freq: Every day | ORAL | 0 refills | Status: AC
  Filled 2022-10-21: qty 238, 14d supply, fill #0

## 2022-10-21 MED ORDER — CYANOCOBALAMIN 1000 MCG PO TABS
1000.0000 ug | ORAL_TABLET | Freq: Every day | ORAL | 0 refills | Status: AC
  Filled 2022-10-21: qty 30, 30d supply, fill #0

## 2022-10-21 MED ORDER — LIDOCAINE 5 % EX PTCH
1.0000 | MEDICATED_PATCH | Freq: Every day | CUTANEOUS | 0 refills | Status: AC
  Filled 2022-10-21: qty 30, 30d supply, fill #0

## 2022-10-21 NOTE — TOC Progression Note (Addendum)
Transition of Care [facility]) - Initial/Assessment Note    Patient Details  Name: Gregory Snyder MRN: [id number] Date of Birth: 16-Jun-1976  Transition of [facility]) CM/SW Contact:    Milinda Antis, [place] Phone Number: 10/21/2022, 8:41 AM  Clinical Narrative:                 LCSWa contacted TOC leadership to receive an update on discharge location for this patient.  LCSW was informed that an answer should be received by this afternoon.    LCSW messaged SLP to inquire about the size dry bag the patient will need for his supplies.   12:00-  LCSW consulted with TOC leadership.  The patient received a one time grant which will provide a 2 week stay in a hotel starting tomorrow.  The extended stay hotel will be near the [facility] office so that the patient can go to complete his application for disability.  The [facility] will have a congregational RN who will assist the patient once he checks in at the [facility] on Monday.  The patient's EBT card and supplies will be delivered to the [facility] for the patient to retrieve.  LCSW assisted patient with completing the application for food and nutrition benefits.    TOC following.     Barriers to Discharge: Continued Medical Work up, Homeless with medical needs   Patient Goals and CMS Choice Patient states their goals for this hospitalization and ongoing recovery are:: To find a stable home CMS Medicare.gov Compare Post Acute Care list provided to:: Patient Choice offered to / list presented to : Patient      Expected Discharge Plan and Services In-house Referral: Clinical Social Work, PCP / Psychologist, educational Discharge Planning Services: CM Consult, Medication Assistance   Living arrangements for the past 2 months: Homeless                                      Prior Living Arrangements/Services Living arrangements for the past 2 months: Homeless Lives with:: Self Patient language and need for interpreter reviewed:: Yes Do you feel safe going back to the  place where you live?: Yes      Need for Family Participation in Patient Care: Yes (Comment) Care giver support system in place?: Yes (comment)   Criminal Activity/Legal Involvement Pertinent to Current Situation/Hospitalization: No - Comment as needed  Activities of Daily Living Home Assistive Devices/Equipment: None ADL Screening (condition at time of admission) Patient's cognitive ability adequate to safely complete daily activities?: Yes Is the patient deaf or have difficulty hearing?: No Does the patient have difficulty seeing, even when wearing glasses/contacts?: No Does the patient have difficulty concentrating, remembering, or making decisions?: No Patient able to express need for assistance with ADLs?: No Does the patient have difficulty dressing or bathing?: No Independently performs ADLs?: Yes (appropriate for developmental age) Does the patient have difficulty walking or climbing stairs?: No Weakness of Legs: Both Weakness of Arms/Hands: None  Permission Sought/Granted Permission sought to share information with : Case Manager, Customer service manager, Family Supports Permission granted to share information with : Yes, Verbal Permission Granted              Emotional Assessment Appearance:: Appears stated age Attitude/Demeanor/Rapport: Engaged, Gracious Affect (typically observed): Accepting, Appropriate, Calm, Hopeful, Pleasant Orientation: : Oriented to Self, Oriented to Place, Oriented to  Time, Oriented to Situation Alcohol / Substance Use: Alcohol Use  Psych Involvement: No (comment)  Admission diagnosis:  Aspiration pneumonia ([place]) [J69.0] Tracheitis [J04.10] Aspiration pneumonia of left upper lobe, unspecified aspiration pneumonia type [facility]) [J69.0] Patient Active Problem List   Diagnosis Date Noted   Normocytic anemia 10/16/2022   History of throat cancer 10/11/2022   Hypothyroidism 10/11/2022   Chronic hepatitis C ([place]) 10/11/2022   Nodule of  skin of right upper extremity 10/11/2022   Peripheral neuropathy 01/22/2022   Aspiration pneumonitis ([place])    Edema    Cellulitis 01/13/2022   Ludwig's angina    Throat cancer ([place])    Tongue swelling    PCP:  System, Provider Not In Pharmacy:   [facility] [address] [place] [address] Phone: [phone] Fax: [phone]     Social Determinants of Health (SDOH) Social History: [place]: Low Risk  (10/12/2022)  Tobacco Use: Medium Risk (10/11/2022)   SDOH Interventions:     Readmission Risk Interventions     No data to display

## 2022-10-21 NOTE — TOC Progression Note (Signed)
Transition of Care [facility]) - Progression Note    Patient Details  Name: Gregory Snyder MRN: [id number] Date of Birth: Nov 11, 1975  Transition of Care [facility]) CM/SW Contact  Tom-Johnson, Renea Ee, RN Phone Number: 10/21/2022, 4:47 PM  Clinical Narrative:     Hospital f/u appointment scheduled with Ileene Rubens, MD for 10/27/22 at 3:15 pm. Info on AVS.     Barriers to Discharge: Barriers Resolved  Expected Discharge Plan and Services In-house Referral: Clinical Social Work, PCP / Psychologist, educational Discharge Planning Services: CM Consult, Medication Assistance   Living arrangements for the past 2 months: Homeless                                       Social Determinants of Health (SDOH) Interventions [place]: Low Risk  (10/12/2022)  Tobacco Use: Medium Risk (10/11/2022)    Readmission Risk Interventions     No data to display

## 2022-10-21 NOTE — Plan of Care (Signed)
  Problem: Activity: Goal: Ability to tolerate increased activity will improve Outcome: Progressing   Problem: Health Behavior/Discharge Planning: Goal: Ability to manage tracheostomy will improve Outcome: Progressing   Problem: Role Relationship: Goal: Ability to communicate will improve Outcome: Progressing   Problem: Education: Goal: Knowledge of General Education information will improve Description: Including pain rating scale, medication(s)/side effects and non-pharmacologic comfort measures Outcome: Progressing   Problem: Nutrition: Goal: Adequate nutrition will be maintained Outcome: Progressing   Problem: Coping: Goal: Level of anxiety will decrease Outcome: Progressing   Problem: Safety: Goal: Ability to remain free from injury will improve Outcome: Progressing

## 2022-10-21 NOTE — Plan of Care (Signed)
  Problem: Activity: Goal: Ability to tolerate increased activity will improve Outcome: Progressing   Problem: Health Behavior/Discharge Planning: Goal: Ability to manage tracheostomy will improve Outcome: Progressing   Problem: Respiratory: Goal: Patent airway maintenance will improve Outcome: Progressing   Problem: Role Relationship: Goal: Ability to communicate will improve Outcome: Progressing

## 2022-10-21 NOTE — Progress Notes (Signed)
[facility] Progress Note Patient: Gregory Snyder [id number] DOB: [birth year] DOA: 10/11/2022  DOS: the patient was seen and examined on 10/21/2022  Brief hospital course: Gregory Snyder is a 47 year old male with PMH SCC of the head and neck s/p radical neck dissection, laryngectomy, total thyroidectomy, tracheostomy who presented with increased neck swelling. He has not been using a Lary tube since May last year. He presented with increased neck pain and erythema that started the day of admission. He has had difficulty breathing.  Hx of SCC Cx of the head and neck with tracheostomy stoma here with aspiration pneumonia on room air and cellulitis of neck. ENT was consulted, obstructive mucous plug was debrided and a Shiley 4-0 cuffless tube was placed on 2/13. Patient received pulmonary care at, electrolarynx, to Lary tube, 50+ HME's and other accessories on 2/16.  A prescription form was filled out for additional supplies as well. Recommended to follow-up with [facility] ENT as there is a concern that the TEP prosthesis leakage is responsible for recurrent pneumonia. Patient was homeless.  TOC is arranging placement at a hotel starting 2/23 Assessment and Plan: Aspiration pneumonia  Stable on room air Unasyn course completed inpatient PRN suction of tracheostomy stoma    Cellulitis, neck neck cellulitis spreading to stoma and to anterior chest  CT neck soft tissue did not show any abscess  Blood culture x 2 have had no growth MRSA screen is positive   History of throat cancer head and neck s/p rdial neck dissection, laryngectomy, total thyroidectomy, tracheostomy. Reports previously following with ENT at [facility] last year but has not been following up since. He has been evaluated by ENT.  Mucous plug was debrided. the Shiley cuffless tube was inserted. Once laryngectomy tube was available, it was inserted. Additional supplies were also ordered on 2/16. He is recommended to follow up with  ENT every 3-6 months and currently willing to follow-up with [facility] for the same. Patient will require a follow-up with [facility] to ensure that TEP prosthesis is working adequately and if not it is removed.   Hypothyroidism s/p total thyroidectomy  has stopped taking medication at least 6 months ago due to financial constraints. Appears to be homeless and living with a friend. TSH elevated at 56.362 on 10/11/2022.  Free T4 undetectable Patient is receiving levothyroxine 137 mcg daily. Discussed importance of compliance at discharge. Needs repeat TSH and T4 in 4 to 6 weeks   Nodule of skin of right upper extremity has painful firm nodule on anterior right forearm; has had u/s x 2 which shows mild edema, no nodule or fluid collection   Chronic hepatitis C ([place]) Has hx of untreated hepatitis C going back to 2022 and never followed up with GI HCV RNA 1.85 million needs to establish with primary and GI for treatment; TOC assisting with PCP at discharge  Right shoulder pain. X-ray unremarkable. Continue muscle relaxant.  Subjective: No nausea no vomiting.  No fever no chills  Physical Exam: General: in Mild distress, No Rash Cardiovascular: S1 and S2 Present, No Murmur Respiratory: Good respiratory effort, Bilateral Air entry present. No Crackles, No wheezes Abdomen: Bowel Sound present, No tenderness Extremities: No edema Neuro: Alert and oriented x3, no new focal deficit  Data Reviewed: I have Reviewed nursing notes, Vitals, and Lab results. Disposition: Status is: Inpatient Remains inpatient appropriate because: Need for safe discharge plan.  enoxaparin (LOVENOX) injection 40 mg Start: 10/12/22 0800   Family Communication: No one at bedside Level of  care: Med-Surg  Vitals:   10/21/22 0510 10/21/22 0830 10/21/22 0943 10/21/22 1222  BP: 112/74  102/65   Pulse: 78 83 87 81  Resp: 18 18 18 18  $ Temp: 98.5 F (36.9 C)  98.5 F (36.9 C)   TempSrc: Oral  Oral   SpO2:  97% 98% 100% 98%  Weight:      Height:         Author: Berle Mull, MD 10/21/2022 4:49 PM  Please look on [email or web address] to find out who is on call.

## 2022-10-22 ENCOUNTER — Other Ambulatory Visit (HOSPITAL_COMMUNITY): Payer: Self-pay

## 2022-10-22 ENCOUNTER — Telehealth: Payer: Self-pay

## 2022-10-22 MED ORDER — HYDROCODONE-ACETAMINOPHEN 5-325 MG PO TABS
1.0000 | ORAL_TABLET | Freq: Three times a day (TID) | ORAL | 0 refills | Status: AC | PRN
  Filled 2022-10-22: qty 15, 5d supply, fill #0

## 2022-10-22 NOTE — Progress Notes (Signed)
Gregory Snyder was provided with numbers for [employer] and ordering supplies and a regular cadence to do this 20th of every month. He will use  relay services to facilitate phone communication with Gregory Snyder has also used this service with other laryngectomy clients and it works well, Gregory Snyder has tried [employer] and likes it. He also has the number to the resource center. He will keep the majority of his laryngectomy supplies in a locker at the resource center and carry enough for 2-3 days on his person.

## 2022-10-22 NOTE — Discharge Instructions (Addendum)
Check in Monday at the [facility] and sign up for nursing assistance at the front desk upon his arrival.  Ask for Desma Paganini - Congregational RN @ [facility] (Monday, Wednesday, Friday from 9am-3p)  Hospital f/u appointment scheduled with Ileene Rubens, MD for 10/27/22 at 3:15 pm.  Arlyss Gandy time from [facility] 6= 12:55pm-1:10pm.  Transportation will meet patient after appointment to transport back to [facility] 6 @ 4:30 pm.  Food and Nutrition application reference number= [id number]

## 2022-10-22 NOTE — Telephone Encounter (Signed)
Copied from [place] [phone]. Topic: General - Other >> Oct 22, 2022 10:16 AM [place] C wrote: Reason for CRM: Gregory Snyder with [place] would like for the patient to be contacted by J. [place] to discuss their discharge and coordinated care when possible  The patient can be reached at the [place]  Please contact further when possible

## 2022-10-22 NOTE — Progress Notes (Signed)
Toc meds, cab voucher and bus passes given to patient.

## 2022-10-22 NOTE — Plan of Care (Signed)
  Problem: Activity: Goal: Ability to tolerate increased activity will improve Outcome: Adequate for Discharge   Problem: Health Behavior/Discharge Planning: Goal: Ability to manage tracheostomy will improve Outcome: Adequate for Discharge   Problem: Respiratory: Goal: Patent airway maintenance will improve Outcome: Adequate for Discharge   Problem: Role Relationship: Goal: Ability to communicate will improve Outcome: Adequate for Discharge   Problem: Education: Goal: Knowledge of General Education information will improve Description: Including pain rating scale, medication(s)/side effects and non-pharmacologic comfort measures Outcome: Adequate for Discharge   Problem: Health Behavior/Discharge Planning: Goal: Ability to manage health-related needs will improve Outcome: Adequate for Discharge   Problem: Clinical Measurements: Goal: Ability to maintain clinical measurements within normal limits will improve Outcome: Adequate for Discharge Goal: Will remain free from infection Outcome: Adequate for Discharge Goal: Diagnostic test results will improve Outcome: Adequate for Discharge Goal: Respiratory complications will improve Outcome: Adequate for Discharge Goal: Cardiovascular complication will be avoided Outcome: Adequate for Discharge   Problem: Activity: Goal: Risk for activity intolerance will decrease Outcome: Adequate for Discharge   Problem: Nutrition: Goal: Adequate nutrition will be maintained Outcome: Adequate for Discharge   Problem: Coping: Goal: Level of anxiety will decrease Outcome: Adequate for Discharge   Problem: Elimination: Goal: Will not experience complications related to bowel motility Outcome: Adequate for Discharge Goal: Will not experience complications related to urinary retention Outcome: Adequate for Discharge   Problem: Pain Managment: Goal: General experience of comfort will improve Outcome: Adequate for Discharge   Problem:  Safety: Goal: Ability to remain free from injury will improve Outcome: Adequate for Discharge   Problem: Skin Integrity: Goal: Risk for impaired skin integrity will decrease Outcome: Adequate for Discharge

## 2022-10-22 NOTE — TOC Progression Note (Addendum)
Transition of Care [facility]) - Initial/Assessment Note    Patient Details  Name: Gregory Snyder MRN: [id number] Date of Birth: [birth year]  Transition of Care [facility]) CM/SW Contact:    Milinda Antis, LCSWA Phone Number: 10/22/2022, 9:15 AM  Clinical Narrative:                 LCSW contacted Brion Aliment, LCSW at the [facility] [phone]).  Ms. Marolyn Hammock informed LCSW that the clinic can assist with scheduling transportation for the patient.  LCSW informed Ms. Marolyn Hammock of the patient's appointment on 10/27/2022 @ 15:15 and provided with the location where the patient will be residing.  LCSW also inquired about an appointment being scheduled with Ilsa Iha, Speech Pathologist, about the patient's talking device.  Ms. Marolyn Hammock will message the speech pathologist to request that they meet with patient during patient's appointment on 2/28.  10:00-  LCSW contacted the [facility] to schedule the patient a follow up appointment. Due to the patient receiving assistance form the [facility], a message will be sent to Di Kindle the social worker for the [facility] and [facility] requesting that Ms. Vaughan Sine contact Neita Carp, congregational RN at the [facility] who will follow the patient.  10:30-  LCSW received call from Brion Aliment with the [facility] stating that transportation to and from the appointment at [facility] has been scheduled.  Pickup time has been added to the AVS.  LCSW was informed that a speech pathologist will meet with the patient the same day.  Patient updated.  LCSW will continue to follow.    Barriers to Discharge: Barriers Resolved   Patient Goals and CMS Choice Patient states their goals for this hospitalization and ongoing recovery are:: To find a stable home CMS Medicare.gov Compare Post Acute Care list provided to:: Patient Choice offered to / list presented to : Patient      Expected Discharge Plan and Services In-house  Referral: Clinical Social Work, PCP / Psychologist, educational Discharge Planning Services: CM Consult, Medication Assistance   Living arrangements for the past 2 months: Homeless                                      Prior Living Arrangements/Services Living arrangements for the past 2 months: Homeless Lives with:: Self Patient language and need for interpreter reviewed:: Yes Do you feel safe going back to the place where you live?: Yes      Need for Family Participation in Patient Care: Yes (Comment) Care giver support system in place?: Yes (comment)   Criminal Activity/Legal Involvement Pertinent to Current Situation/Hospitalization: No - Comment as needed  Activities of Daily Living Home Assistive Devices/Equipment: None ADL Screening (condition at time of admission) Patient's cognitive ability adequate to safely complete daily activities?: Yes Is the patient deaf or have difficulty hearing?: No Does the patient have difficulty seeing, even when wearing glasses/contacts?: No Does the patient have difficulty concentrating, remembering, or making decisions?: No Patient able to express need for assistance with ADLs?: No Does the patient have difficulty dressing or bathing?: No Independently performs ADLs?: Yes (appropriate for developmental age) Does the patient have difficulty walking or climbing stairs?: No Weakness of Legs: Both Weakness of Arms/Hands: None  Permission Sought/Granted Permission sought to share information with : Case Manager, Customer service manager, Family Supports Permission granted to share information with : Yes, Verbal Permission Granted  Emotional Assessment Appearance:: Appears stated age Attitude/Demeanor/Rapport: Engaged, Gracious Affect (typically observed): Accepting, Appropriate, Calm, Hopeful, Pleasant Orientation: : Oriented to Self, Oriented to Place, Oriented to  Time, Oriented to Situation Alcohol / Substance Use:  Alcohol Use Psych Involvement: No (comment)  Admission diagnosis:  Aspiration pneumonia ([place]) [J69.0] Tracheitis [J04.10] Aspiration pneumonia of left upper lobe, unspecified aspiration pneumonia type [facility]) [J69.0] Patient Active Problem List   Diagnosis Date Noted   Normocytic anemia 10/16/2022   History of throat cancer 10/11/2022   Hypothyroidism 10/11/2022   Chronic hepatitis C ([place]) 10/11/2022   Nodule of skin of right upper extremity 10/11/2022   Peripheral neuropathy 01/22/2022   Aspiration pneumonitis ([place])    Edema    Cellulitis 01/13/2022   Ludwig's angina    Throat cancer ([place])    Tongue swelling    PCP:  System, Provider Not In Pharmacy:   [facility] [address] [place] [address] Phone: [phone] Fax: [phone]     Social Determinants of Health (SDOH) Social History: Wirt: Low Risk  (10/12/2022)  Tobacco Use: Medium Risk (10/11/2022)   SDOH Interventions:     Readmission Risk Interventions     No data to display

## 2022-10-22 NOTE — Progress Notes (Signed)
Spoke with Gregory Snyder from [facility] about supply delivery next week for first shipment of ongoing laryngectomy supplies. Gregory Snyder's supplies will be delivered to the [facility] on [address]. In [place] on Wednesday - recommend he make the trip to pick them up on Thursday to be sure they are delivered. Gregory Snyder will need support going forward for scheduling further deliveries of laryngectomy supplies. It might be beneficial for him to coordinate with the [facility] on a monthly cadence to connect with [facility] about ongoing supply shipments. I will provide education to him today prior to discharge on what supplies he will receive on an ongoing basis.

## 2022-10-25 ENCOUNTER — Telehealth: Payer: Self-pay | Admitting: Pediatric Intensive Care

## 2022-10-25 NOTE — Telephone Encounter (Signed)
Left voice message for Brion Aliment, oncology social worker, to call back regarding Medicaid transportation for client and correct pickup address. Please call back at [phone]. Lisette Abu BSN RN CCNP [phone]

## 2022-10-25 NOTE — Discharge Summary (Signed)
Physician Discharge Summary   Patient: Gregory Snyder MRN: [id number] DOB: Oct 13, 1975  Admit date:     10/11/2022  Discharge date: 10/22/2022  Discharge Physician: Berle Mull  PCP: System, Provider Not In  Recommendations at discharge: Follow-up with ENT at [facility] to discuss about TEP Device functioning. Follow-up with PCP.   Follow-up Information     Edgar Frisk, MD. Go in 6 day(s).   Specialty: Otolaryngology Why: Go to appointment on 10/27/22 at 3:15 pm. Contact information: [address] CB# [phone]; Rm [id number] Phys Ofc [place] [address] [phone]         PCP. Schedule an appointment as soon as possible for a visit in 1 week(s).          Jenetta Downer, MD. Call.   Specialty: Otolaryngology Why: As needed for local follow up. 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 He has not been using a Lary tube since May last year. He presented with increased neck pain and erythema that started the day of admission. He has had difficulty breathing.  Hx of SCC Cx of the head and neck with tracheostomy stoma here with aspiration pneumonia on room air and cellulitis of neck. ENT was consulted, obstructive mucous plug was debrided and a Shiley 4-0 cuffless tube was placed on 2/13. Patient received pulmonary care at, electrolarynx, to Lary tube, 50+ HME's and other accessories on 2/16.  A prescription form was filled out for additional supplies as well. Recommended to  follow-up with [facility] ENT as there is a concern that the TEP prosthesis leakage is responsible for recurrent pneumonia. Patient was homeless.  TOC is arranging placement at a hotel starting 2/23 Assessment and Plan  Aspiration pneumonia  Stable on room air Unasyn course completed inpatient PRN suction of tracheostomy stoma   Cellulitis, neck Neck cellulitis spreading to stoma and to anterior chest  CT neck soft tissue did not show any abscess  Blood culture x 2 have had no growth MRSA screen is positive  History of throat cancer head and neck s/p rdial neck dissection, laryngectomy, total thyroidectomy, tracheostomy. Reports previously following with ENT at [facility] last year but has not been following up since. He has been evaluated by ENT.  Mucous plug was debrided. the Shiley cuffless tube was inserted. Once laryngectomy tube was available, it was inserted instead of the tracheostomy tube. Additional supplies were also ordered on 2/16. He is recommended to follow up with ENT every 3-6 months and currently willing to follow-up with [facility] ENT for the same. Patient will require a follow-up with [facility] ENT to ensure that TEP prosthesis is working adequately and if not it is removed.   Hypothyroidism s/p total thyroidectomy  has stopped taking medication at least 6 months ago due to financial constraints. Appears to be homeless and living with a friend. TSH elevated at 56.362 on 10/11/2022.  Free T4 undetectable Patient is receiving levothyroxine 137  mcg daily. Discussed importance of compliance at discharge. Needs repeat TSH and T4 in 4 to 6 weeks   Nodule of skin of right upper extremity has painful firm nodule on anterior right forearm; has had u/s x 2 which shows mild edema, no nodule or fluid collection   Chronic hepatitis C ([place]) Has hx of untreated hepatitis C going back to 2022 and never followed up with GI HCV RNA 1.85 million needs to establish with primary and GI for  treatment; TOC assisting with PCP at discharge   Right shoulder pain. X-ray unremarkable. Continue muscle relaxant.   Pain control - [employer] Controlled Substance Reporting System database was reviewed. and patient was instructed, not to drive, operate heavy machinery, perform activities at heights, swimming or participation in water activities or provide baby-sitting services while on Pain, Sleep and Anxiety Medications; until their outpatient Physician has advised to do so again. Also recommended to not to take more than prescribed Pain, Sleep and Anxiety Medications.  Consultants:  ENT  Procedures performed:  Bedside mucous plug debridement with tracheostomy tube placement by ENT  DISCHARGE MEDICATION: Allergies as of 10/22/2022       Reactions   Motrin [ibuprofen] Hives        Medication List     STOP taking these medications    amitriptyline 10 MG tablet Commonly known as: ELAVIL   Vitamin D (Ergocalciferol) 1.25 MG (50000 UNIT) Caps capsule Commonly known as: DRISDOL       TAKE these medications    cyanocobalamin 1000 MCG tablet Commonly known as: VITAMIN B12 Take 1 tablet (1,000 mcg total) by mouth daily.   FeroSul 325 (65 FE) MG tablet Generic drug: ferrous sulfate Take 1 tablet (325 mg total) by mouth daily with breakfast.   gabapentin 100 MG capsule Commonly known as: Neurontin Take 1 capsule (100 mg total) by mouth 3 (three) times daily.   HYDROcodone-acetaminophen 5-325 MG tablet Commonly known as: NORCO/VICODIN Take 1 tablet by mouth every 8 (eight) hours as needed for moderate pain or severe pain.   levothyroxine 137 MCG tablet Commonly known as: SYNTHROID Take 1 tablet (137 mcg total) by mouth daily before breakfast.   lidocaine 5 % Commonly known as: LIDODERM Place 1 patch onto the skin at bedtime. Remove & Discard patch within 12 hours or as directed by MD   methocarbamol 500 MG tablet Commonly known as: ROBAXIN Take 1 tablet (500  mg total) by mouth 3 (three) times daily.   polyethylene glycol powder 17 GM/SCOOP powder Commonly known as: GLYCOLAX/MIRALAX Take 17 g by mouth daily.       Disposition: Home Diet recommendation: Regular diet  Discharge Exam: Vitals:   10/22/22 0848 10/22/22 0859 10/22/22 1121 10/22/22 1516  BP:  (!) 104/55    Pulse: 87 89 77 85  Resp: '18 18 18 18  '$ Temp:  98.5 F (36.9 C)    TempSrc:  Oral    SpO2: 96% 97% 96% 97%  Weight:      Height:       General: Appear in no distress; no visible Abnormal Neck Mass Or lumps, Conjunctiva normal Cardiovascular: S1 and S2 Present, no Murmur, Respiratory: good respiratory effort, Bilateral Air entry present and CTA, no Crackles, no wheezes Abdomen: Bowel Sound present, Non tender  Extremities: no Pedal edema Neurology: alert and oriented to time, place, and person  Filed Weights   10/11/22 1745  Weight: 81.6 kg   Condition at discharge: stable  The results of significant  diagnostics from this hospitalization (including imaging, microbiology, ancillary and laboratory) are listed below for reference.   Imaging Studies: DG Shoulder Right  Result Date: 10/20/2022 CLINICAL DATA:  Right shoulder pain. No trauma history is submitted. EXAM: RIGHT SHOULDER - 2+ VIEW COMPARISON:  04/17/2015 from [place]: Visualized portion of the right hemithorax is normal. The internal rotation view is suboptimal with the entire right hemithorax included. No acute fracture or dislocation. Degenerative changes of the acromioclavicular joint are mild. IMPRESSION: No acute osseous abnormality. Electronically Signed   By: Abigail Miyamoto M.D.   On: 10/20/2022 16:35   [place] RT UPPER EXTREM LTD SOFT TISSUE NON VASCULAR  Result Date: 10/16/2022 CLINICAL DATA:  Palpable nodule in the right upper extremity. EXAM: ULTRASOUND RIGHT UPPER EXTREMITY LIMITED TECHNIQUE: Ultrasound examination of the upper extremity soft tissues was performed in the area of clinical  concern. COMPARISON:  None Available. FINDINGS: No nodule, mass or fluid collection is identified. Mild subcutaneous edema noted. IMPRESSION: Mild subcutaneous edema. Negative for nodule or other focal abnormality. Electronically Signed   By: Inge Rise M.D.   On: 10/16/2022 08:55   DG Abd 1 View  Result Date: 10/13/2022 CLINICAL DATA:  Abnormal chest x-ray. EXAM: ABDOMEN - 1 VIEW COMPARISON:  CT chest 10/12/2022 FINDINGS: Bowel-gas pattern is nonobstructive. There is a very large amount of stool throughout the colon. There is also moderate gaseous distention with questionable gastric debris. No suspicious calcifications. No acute fractures. IMPRESSION: Very large amount of stool throughout the colon. No evidence of bowel obstruction. Electronically Signed   By: Ronney Asters M.D.   On: 10/13/2022 20:00   CT CHEST WO CONTRAST  Result Date: 10/12/2022 CLINICAL DATA:  Shortness of breath. EXAM: CT CHEST WITHOUT CONTRAST TECHNIQUE: Multidetector CT imaging of the chest was performed following the standard protocol without IV contrast. RADIATION DOSE REDUCTION: This exam was performed according to the departmental dose-optimization program which includes automated exposure control, adjustment of the mA and/or kV according to patient size and/or use of iterative reconstruction technique. COMPARISON:  Radiograph of October 11, 2022. CT scan of April 17, 2015. FINDINGS: Cardiovascular: No significant vascular findings. Normal heart size. No pericardial effusion. Mediastinum/Nodes: Status post laryngectomy. No adenopathy is noted. The esophagus is unremarkable. Lungs/Pleura: No pneumothorax or pleural effusion is noted. Mild bilateral posterior basilar subsegmental atelectasis is noted. Multiple small patchy airspace opacities are noted in the left upper lobe concerning for multifocal pneumonia. Upper Abdomen: Nonobstructive left nephrolithiasis. Musculoskeletal: No chest wall mass or suspicious bone lesions  identified. IMPRESSION: Mild bilateral posterior basilar subsegmental atelectasis. Multiple small patchy airspace opacities are noted in left upper lobe concerning for multifocal pneumonia. Status post laryngectomy. Nonobstructive left nephrolithiasis. Electronically Signed   By: Marijo Conception M.D.   On: 10/12/2022 14:48   [place] RT UPPER EXTREM LTD SOFT TISSUE NON VASCULAR  Result Date: 10/11/2022 CLINICAL DATA:  Palpable nodule, rule out abscess EXAM: ULTRASOUND RIGHT UPPER EXTREMITY LIMITED TECHNIQUE: Ultrasound examination of the upper extremity soft tissues was performed in the area of clinical concern in the distal forearm. COMPARISON:  None Available. FINDINGS: Subcutaneous soft tissue edema. No appreciable fluid collection or abscess. IMPRESSION: Subcutaneous soft tissue edema. No appreciable fluid collection or abscess. Electronically Signed   By: Keane Police D.O.   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 EXAM: CT HEAD WITHOUT AND WITH CONTRAST TECHNIQUE: Contiguous axial  images were obtained from the base of the skull through the vertex without and with intravenous contrast. RADIATION DOSE REDUCTION: This exam was performed according to the departmental dose-optimization program which includes automated exposure control, adjustment of the mA and/or kV according to patient size and/or use of iterative reconstruction technique. CONTRAST:  41m OMNIPAQUE IOHEXOL 350 MG/ML SOLN COMPARISON:  CT head 01/22/2022 FINDINGS: Brain: There is no acute intracranial hemorrhage, extra-axial fluid collection, or acute infarct. Parenchymal volume is normal. The ventricles are normal in size. Gray-white differentiation is preserved. There is no mass lesion or abnormal enhancement. The pituitary and suprasellar region are normal. There is no mass effect or midline shift. Vascular: No hyperdense vessel or  unexpected calcification. Visible vessels are patent. Skull: Normal. Negative for fracture or focal lesion. Sinuses/Orbits: There is mild mucosal thickening in the paranasal sinuses. The globes and orbits are unremarkable. Other: None. IMPRESSION: No acute intracranial pathology or evidence of intracranial metastatic disease. Electronically Signed   By: PValetta MoleM.D.   On: 10/11/2022 20:34   CT Soft Tissue Neck W Contrast  Result Date: 10/11/2022 CLINICAL DATA:  Face pain, chills, and vomiting for 3 days. History of head/neck cancer with total laryngectomy. EXAM: CT NECK WITH CONTRAST TECHNIQUE: Multidetector CT imaging of the neck was performed using the standard protocol following the bolus administration of intravenous contrast. RADIATION DOSE REDUCTION: This exam was performed according to the departmental dose-optimization program which includes automated exposure control, adjustment of the mA and/or kV according to patient size and/or use of iterative reconstruction technique. CONTRAST:  772mOMNIPAQUE IOHEXOL 350 MG/ML SOLN COMPARISON:  CT neck 01/26/2021 FINDINGS: Pharynx and larynx: The nasal cavity and nasopharynx are unremarkable. The parapharyngeal spaces are clear. The status post total laryngectomy, partial glossectomy, and partial pharyngectomy. There is no suspicious mucosal enhancement or solid mass lesion along the postsurgical aerodigestive tract. Soft tissue thickening in the subcutaneous tissues of the neck anteriorly as well as posterolateral to the right internal jugular vein is overall similar to the prior study. Infiltrative soft tissue thickening surrounding the right internal carotid artery is unchanged (4-54). Diffuse subcutaneous edema is also overall similar. There is no new suspicious finding. A TEP device is in place. Salivary glands: The parotid glands are unremarkable. The right submandibular gland is surgically absent. The left submandibular gland is unremarkable. Thyroid:  Surgically absent. Lymph nodes: The patient is status post bilateral neck dissection. There is no pathologic lymphadenopathy in the neck. Vascular: There is unchanged narrowing of the right internal jugular vein in the upper neck. The left internal jugular vein is occluded/surgically absent. The carotid arteries are patent. Limited intracranial: Assessed on the separately dictated head CT. Visualized orbits: The globes and orbits are unremarkable. Mastoids and visualized paranasal sinuses: The mastoid air cells and middle ear cavities are clear. The paranasal sinuses are clear. Skeleton: There is no acute osseous abnormality or suspicious osseous lesion. Upper chest: There is debris in the trachea. There is patchy ground-glass opacity in the left upper lobe. Other: None. IMPRESSION: 1. Stable posttreatment appearance of the neck since 01/26/2022 with no new or suspicious finding. 2. Small amount of debris in the trachea. 3. Patchy ground-glass opacity in the left upper lobe favored infectious/inflammatory in etiology. Electronically Signed   By: PeValetta Mole.D.   On: 10/11/2022 20:23   DG Chest 2 View  Result Date: 10/11/2022 CLINICAL DATA:  Shortness of breath EXAM: CHEST - 2 VIEW COMPARISON:  CT chest abdomen and pelvis 04/17/2015 FINDINGS:  There is air under the right hemidiaphragm, most likely within the colon. There is gaseous distention of the colon under the left hemidiaphragm as well. The lungs are clear. There is no pleural effusion or pneumothorax. There is a large air-fluid level in the stomach. The cardiomediastinal silhouette is within normal limits. No acute fractures are seen. IMPRESSION: 1. Gaseous distention of the colon with large air-fluid level in the stomach. Please correlate clinically. Consider dedicated abdominal imaging. 2. No acute cardiopulmonary process. Electronically Signed   By: Ronney Asters M.D.   On: 10/11/2022 19:39    Microbiology: Results for orders placed or performed  during the hospital encounter of 10/11/22  Blood culture (routine x 2)     Status: None   Collection Time: 10/11/22  9:06 PM   Specimen: BLOOD  Result Value Ref Range Status   Specimen Description BLOOD BLOOD RIGHT HAND  Final   Special Requests   Final    BOTTLES DRAWN AEROBIC AND ANAEROBIC Blood Culture adequate volume   Culture   Final    NO GROWTH 5 DAYS Performed at [facility], [place] [address] [place], [place] [address]    Report Status 10/16/2022 FINAL  Final  Resp panel by RT-PCR (RSV, Flu A&B, Covid)     Status: None   Collection Time: 10/11/22  9:06 PM   Specimen: Nasal Swab  Result Value Ref Range Status   SARS Coronavirus 2 by RT PCR NEGATIVE NEGATIVE Final   Influenza A by PCR NEGATIVE NEGATIVE Final   Influenza B by PCR NEGATIVE NEGATIVE Final    Comment: (NOTE) The Xpert Xpress SARS-CoV-2/FLU/RSV plus assay is intended as an aid in the diagnosis of influenza from Nasopharyngeal swab specimens and should not be used as a sole basis for treatment. Nasal washings and aspirates are unacceptable for Xpert Xpress SARS-CoV-2/FLU/RSV testing.  Fact Sheet for Patients: EntrepreneurPulse.com.au  Fact Sheet for Healthcare Providers: IncredibleEmployment.be  This test is not yet approved or cleared by the [place] FDA and has been authorized for detection and/or diagnosis of SARS-CoV-2 by FDA under an Emergency Use Authorization (EUA). This EUA will remain in effect (meaning this test can be used) for the duration of the COVID-19 declaration under Section 564(b)(1) of the Act, 21 U.S.C. section 360bbb-3(b)(1), unless the authorization is terminated or revoked.     Resp Syncytial Virus by PCR NEGATIVE NEGATIVE Final    Comment: (NOTE) Fact Sheet for Patients: EntrepreneurPulse.com.au  Fact Sheet for Healthcare Providers: IncredibleEmployment.be  This test is not yet approved or  cleared by the [place] FDA and has been authorized for detection and/or diagnosis of SARS-CoV-2 by FDA under an Emergency Use Authorization (EUA). This EUA will remain in effect (meaning this test can be used) for the duration of the COVID-19 declaration under Section 564(b)(1) of the Act, 21 U.S.C. section 360bbb-3(b)(1), unless the authorization is terminated or revoked.  Performed at [facility], [place] [address], [place], [place] [address]   Blood culture (routine x 2)     Status: None   Collection Time: 10/11/22  9:11 PM   Specimen: BLOOD  Result Value Ref Range Status   Specimen Description BLOOD BLOOD LEFT HAND  Final   Special Requests   Final    BOTTLES DRAWN AEROBIC AND ANAEROBIC Blood Culture adequate volume   Culture   Final    NO GROWTH 5 DAYS Performed at [facility], [place] [address] [place], [place] [address]  Report Status 10/16/2022 FINAL  Final  MRSA Next Gen by PCR, Nasal     Status: Abnormal   Collection Time: 10/13/22  5:16 AM   Specimen: Nasal Mucosa; Nasal Swab  Result Value Ref Range Status   MRSA by PCR Next Gen DETECTED (A) NOT DETECTED Final    Comment: RESULT CALLED TO, READ BACK BY AND VERIFIED WITH: RN A BARLOW [id number] AT 67 AM BY CM (NOTE) The GeneXpert MRSA Assay (FDA approved for NASAL specimens only), is one component of a comprehensive MRSA colonization surveillance program. It is not intended to diagnose MRSA infection nor to guide or monitor treatment for MRSA infections. Test performance is not FDA approved in patients less than 3 years old. Performed at [facility], [place] [address], [place], [place] [address]    Labs: CBC: No results for input(s): "WBC", "NEUTROABS", "HGB", "HCT", "MCV", "PLT" in the last 168 hours. Basic Metabolic Panel: No results for input(s): "NA", "K", "CL", "CO2", "GLUCOSE", "BUN", "CREATININE", "CALCIUM", "MG", "PHOS" in the last 168 hours. Liver Function Tests: No results for  input(s): "AST", "ALT", "ALKPHOS", "BILITOT", "PROT", "ALBUMIN" in the last 168 hours. CBG: No results for input(s): "GLUCAP" in the last 168 hours.  Discharge time spent: greater than 30 minutes.  Signed: Berle Mull, MD [facility] 10/22/2022

## 2022-10-25 NOTE — Telephone Encounter (Signed)
I returned the call to [facility] and left a message with my call back number. I see that he has been discharged. The phone number that she left for the [facility] is the main number that goes to voicemail .  Please call/ message me and I can set the patient up with one of our providers for hospital follow up and to establish care with PCP. I just want to make sure he has transportation to the appt.  Thanks

## 2022-10-25 NOTE — Telephone Encounter (Signed)
Left HIPAA compliant message on client's room phone at [facility] in [place] [phone]) to return call at [phone]. Lisette Abu BSN RN CCNP [phone]

## 2022-11-01 ENCOUNTER — Encounter: Payer: Self-pay | Admitting: *Deleted

## 2022-11-01 NOTE — Congregational Nurse Program (Signed)
  Dept: [phone]   Congregational Nurse Program Note  Date of Encounter: 11/01/2022  Past Medical History: Past Medical History:  Diagnosis Date   Cancer [facility])     Encounter Details:  CNP Questionnaire - 11/01/22 1527       Questionnaire   Ask client: Do you give verbal consent for me to treat you today? Yes    Student Assistance N/A    Location Patient Served  [facility]    Visit Setting with Client Phone/Text/Email;Organization    Patient Status Unhoused    Insurance [facility]    Insurance/Financial Assistance Referral N/A    Medication N/A    Medical Provider Yes    Screening Referrals Made N/A    Medical Referrals Made [facility]    Medical Appointment Made [facility] PCP[facility]clinic;Other    Recently w/o PCP, now 1st time PCP visit completed due to CNs referral or appointment made N/A    Food N/A    Transportation Need transportation assistance;Provided transportation assistance    Housing/Utilities No permanent housing    Interpersonal Safety N/A    Interventions Advocate/Support    Abnormal to Normal Screening Since Last CN Visit N/A    Screenings CN Performed N/A    Sent Client to Lab for: N/A    Did client attend any of the following based off CNs referral or appointments made? N/A    ED Visit Averted N/A    Life-Saving Intervention Made N/A            Client came to [facility] seeking help with housing, transportation, food benefits, medical appointments and disability. Assisted client with an appointment [facility] Margarita Rana March 6th at 0920. Gave 31 day bus pass for transportation. Called Medicaid transportation benefits and client must have a phone to set up. Referred client to [facility] to get started on phone/housing.[place] and made an appointment for March 7th 2:00 with [facility] [address] Referred to [facility] for ID.Assisted client in registering food card. Offered support and encouragement. Zuriel Roskos W RN  CN

## 2022-11-03 ENCOUNTER — Ambulatory Visit: Payer: Medicaid Other | Admitting: Family Medicine

## 2023-06-13 ENCOUNTER — Emergency Department (HOSPITAL_COMMUNITY): Payer: Medicaid Other

## 2023-06-13 ENCOUNTER — Encounter (HOSPITAL_COMMUNITY): Payer: Self-pay

## 2023-06-13 ENCOUNTER — Other Ambulatory Visit: Payer: Self-pay

## 2023-06-13 ENCOUNTER — Emergency Department (HOSPITAL_COMMUNITY)
Admission: EM | Admit: 2023-06-13 | Discharge: 2023-06-13 | Disposition: A | Discharge status: Home / Self Care | Payer: Medicaid Other | Attending: Emergency Medicine | Admitting: Emergency Medicine

## 2023-06-13 DIAGNOSIS — S0990XA Unspecified injury of head, initial encounter: Secondary | ICD-10-CM | POA: Diagnosis present

## 2023-06-13 DIAGNOSIS — S0101XA Laceration without foreign body of scalp, initial encounter: Secondary | ICD-10-CM | POA: Insufficient documentation

## 2023-06-13 DIAGNOSIS — Z85818 Personal history of malignant neoplasm of other sites of lip, oral cavity, and pharynx: Secondary | ICD-10-CM | POA: Insufficient documentation

## 2023-06-13 DIAGNOSIS — Z23 Encounter for immunization: Secondary | ICD-10-CM | POA: Insufficient documentation

## 2023-06-13 MED ORDER — LIDOCAINE-EPINEPHRINE (PF) 2 %-1:200000 IJ SOLN
10.0000 mL | Freq: Once | INTRAMUSCULAR | Status: AC
  Administered 2023-06-13: 10 mL via INTRADERMAL
  Filled 2023-06-13: qty 20

## 2023-06-13 MED ORDER — TETANUS-DIPHTH-ACELL PERTUSSIS 5-2.5-18.5 LF-MCG/0.5 IM SUSY
0.5000 mL | PREFILLED_SYRINGE | Freq: Once | INTRAMUSCULAR | Status: AC
  Administered 2023-06-13: 0.5 mL via INTRAMUSCULAR
  Filled 2023-06-13: qty 0.5

## 2023-06-13 NOTE — ED Notes (Signed)
Pt head lac cleaned with soap and water.

## 2023-06-13 NOTE — Discharge Instructions (Signed)
Please return to the ER for staple removal in 5 days. Return to the ER with any new severe symptoms.

## 2023-06-13 NOTE — ED Notes (Signed)
Trach suctioned before discharge and trach cleaned on the outside. Patient alert oriented
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
## 2023-07-08 ENCOUNTER — Emergency Department (HOSPITAL_COMMUNITY)
Admission: EM | Admit: 2023-07-08 | Discharge: 2023-07-08 | Disposition: A | Discharge status: Home / Self Care | Payer: Medicaid Other | Attending: Emergency Medicine | Admitting: Emergency Medicine

## 2023-07-08 ENCOUNTER — Encounter (HOSPITAL_COMMUNITY): Payer: Self-pay

## 2023-07-08 ENCOUNTER — Other Ambulatory Visit: Payer: Self-pay

## 2023-07-08 DIAGNOSIS — Z4802 Encounter for removal of sutures: Secondary | ICD-10-CM | POA: Insufficient documentation

## 2023-07-08 NOTE — Discharge Instructions (Addendum)
Your staples were removed today.  For any concerning symptoms return to the emergency room.

## 2023-07-08 NOTE — ED Provider Notes (Signed)
[place] EMERGENCY DEPARTMENT AT [facility] Provider Note   CSN: [id number] Arrival date & time: 07/08/23  1448     History  Chief Complaint  Patient presents with   Suture / Staple Removal    Gregory Snyder is a 47 y.o. male.  47 year old presents today for staple removal.  These were placed on 10/14.  No signs of infection.  No complaints.  He is agreeable to have these removed today.  The history is provided by the patient. No language interpreter was used.       Home Medications Prior to Admission medications   Medication Sig Start Date End Date Taking? Authorizing Provider  cyanocobalamin 1000 MCG tablet Take 1 tablet (1,000 mcg total) by mouth daily. 10/22/22   Rolly Salter, MD  ferrous sulfate 325 (65 FE) MG tablet Take 1 tablet (325 mg total) by mouth daily with breakfast. 10/22/22   Rolly Salter, MD  gabapentin (NEURONTIN) 100 MG capsule Take 1 capsule (100 mg total) by mouth 3 (three) times daily. 10/21/22   Rolly Salter, MD  HYDROcodone-acetaminophen (NORCO/VICODIN) 5-325 MG tablet Take 1 tablet by mouth every 8 (eight) hours as needed for moderate pain or severe pain. 10/22/22 10/22/23  Rolly Salter, MD  levothyroxine (SYNTHROID) 137 MCG tablet Take 1 tablet (137 mcg total) by mouth daily before breakfast. 10/21/22   Rolly Salter, MD  lidocaine (LIDODERM) 5 % Place 1 patch onto the skin at bedtime. Remove & Discard patch within 12 hours or as directed by MD 10/21/22   Rolly Salter, MD  methocarbamol (ROBAXIN) 500 MG tablet Take 1 tablet (500 mg total) by mouth 3 (three) times daily. 10/21/22   Rolly Salter, MD  polyethylene glycol powder (GLYCOLAX/MIRALAX) 17 GM/SCOOP powder Take 17 g by mouth daily. 10/22/22   Rolly Salter, MD      Allergies    Motrin [ibuprofen]    Review of Systems   Review of Systems  Constitutional:  Negative for chills and fever.  Skin:  Positive for wound.  Neurological:  Negative for light-headedness.  All  other systems reviewed and are negative.   Physical Exam Updated Vital Signs BP (!) 143/94 (BP Location: Right Arm)   Pulse 74   Temp 98.2 F (36.8 C) (Oral)   Resp 16   SpO2 98%  Physical Exam Vitals and nursing note reviewed.  Constitutional:      General: He is not in acute distress.    Appearance: Normal appearance. He is not ill-appearing.  HENT:     Head: Normocephalic and atraumatic.     Nose: Nose normal.  Eyes:     Conjunctiva/sclera: Conjunctivae normal.  Cardiovascular:     Rate and Rhythm: Normal rate.  Pulmonary:     Effort: Pulmonary effort is normal. No respiratory distress.  Musculoskeletal:        General: No deformity. Normal range of motion.  Skin:    Findings: No rash.  Neurological:     Mental Status: He is alert.     ED Results / Procedures / Treatments   Labs (all labs ordered are listed, but only abnormal results are displayed) Labs Reviewed - No data to display  EKG None  Radiology No results found.  Procedures .Suture Removal  Date/Time: 07/08/2023 8:01 PM  Performed by: Marita Kansas, PA-C Authorized by: Marita Kansas, PA-C   Consent:    Consent obtained:  Verbal   Consent given by:  Patient  Risks discussed:  Bleeding, pain and wound separation   Alternatives discussed:  No treatment Universal protocol:    Procedure explained and questions answered to patient or proxy's satisfaction: yes     Relevant documents present and verified: yes     Test results available: yes     Patient identity confirmed:  Verbally with patient and arm band Procedure details:    Wound appearance:  No signs of infection   Number of staples removed:  3 Post-procedure details:    Post-removal:  No dressing applied   Procedure completion:  Tolerated well, no immediate complications     Medications Ordered in ED Medications - No data to display  ED Course/ Medical Decision Making/ A&P                                 Medical Decision  Making  Staples removed.  See the note above.  No signs of infection.  Discharged in stable condition.   Final Clinical Impression(s) / ED Diagnoses Final diagnoses:  Removal of staple    Rx / DC Orders ED Discharge Orders     None         Marita Kansas, PA-C 07/08/23 2001    [address] [place] K, DO 07/08/23 2302

## 2023-07-08 NOTE — ED Triage Notes (Signed)
Patient arrives for staple removal from posterior head. Denies complaints.

## 2023-07-26 IMAGING — CT CT TEMPORAL BONES W/ CM
3 of 8 series · 13 of 47 positions shown, 15 images · IV contrast (agent unspecified)
Comparison: None Available.

CLINICAL DATA: Mastoiditis deep space abscess or infection of neck,
ludgwin angina, patient has redness to mastoid area and pain

EXAM:
CT TEMPORAL BONES WITH CONTRAST
TECHNIQUE: Axial and coronal plane CT imaging of the petrous temporal bones was
performed with thin-collimation image reconstruction following
intravenous contrast administration. Multiplanar CT image
reconstructions were also generated.

[Series 6: temporal bone 0.6 u75u · axial · 0.43mm/px · z∈[-166,-72]mm · 7 of 211 slices shown, 9 images]
[im 27/211  brain]
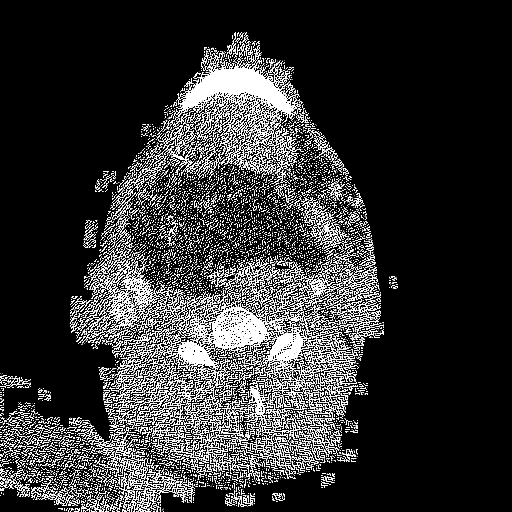
[im 27/211  bone]
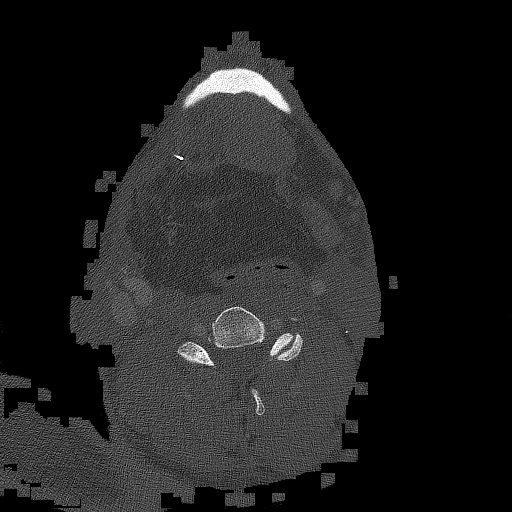
[im 53/211  bone]
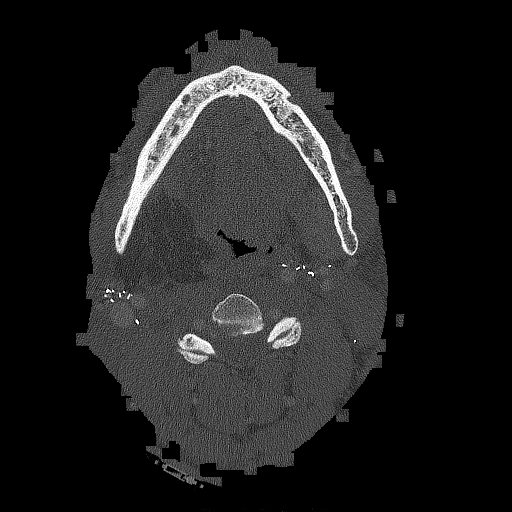
[im 79/211  bone]
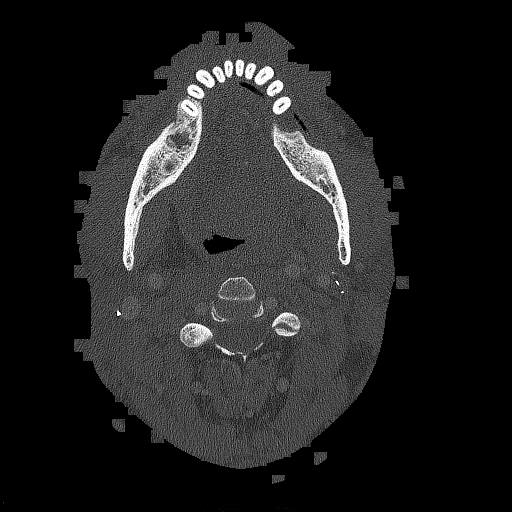
[im 106/211  bone]
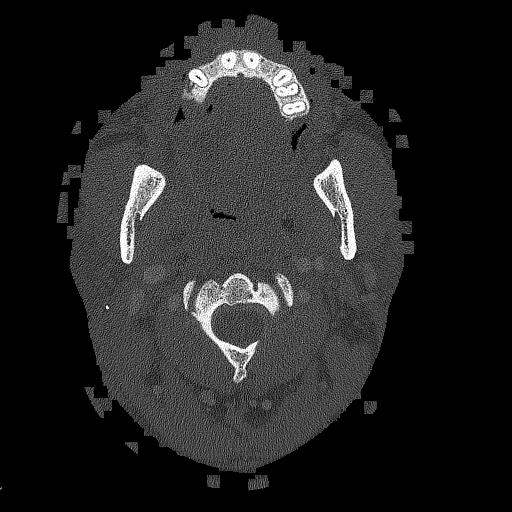
[im 132/211  brain]
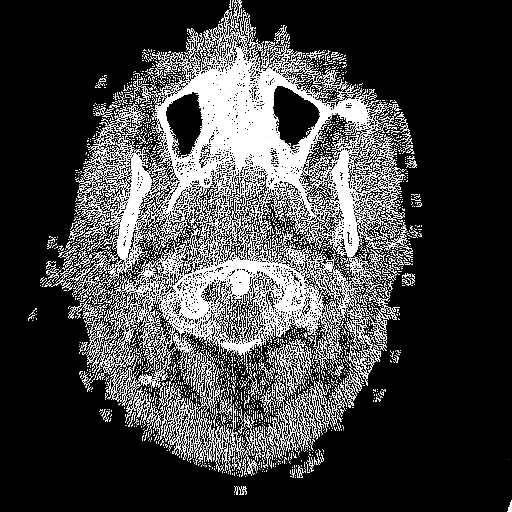
[im 132/211  bone]
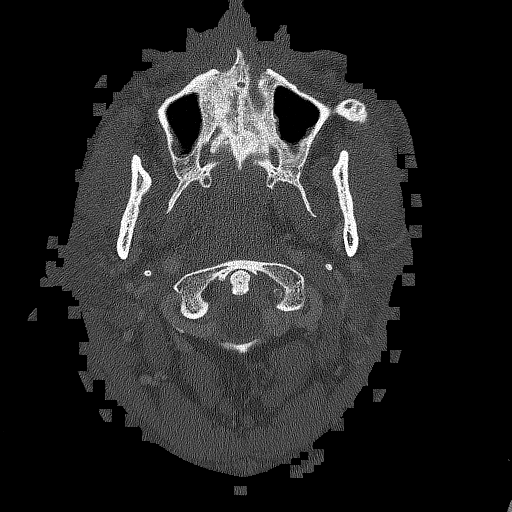
[im 158/211  bone]
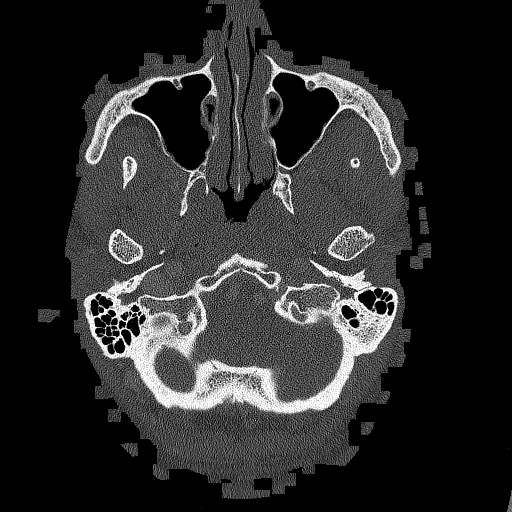
[im 184/211  bone]
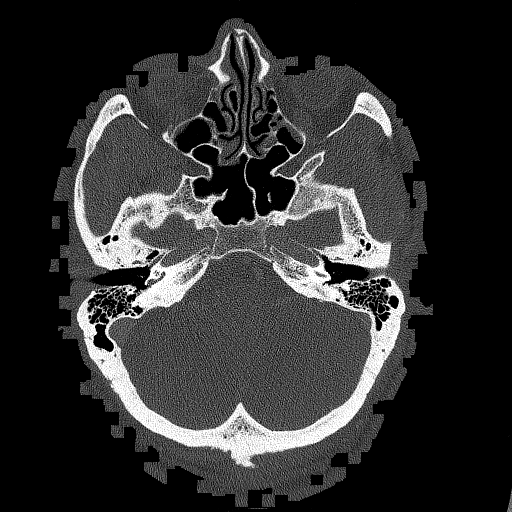

[Series 7: coronals · coronal · 0.19mm/px · 3 of 297 slices shown]
[im 60/297  bone]
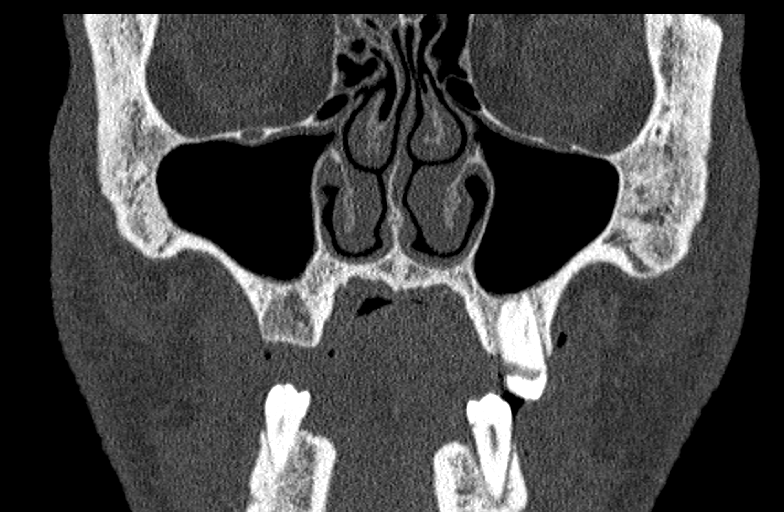
[im 119/297  bone]
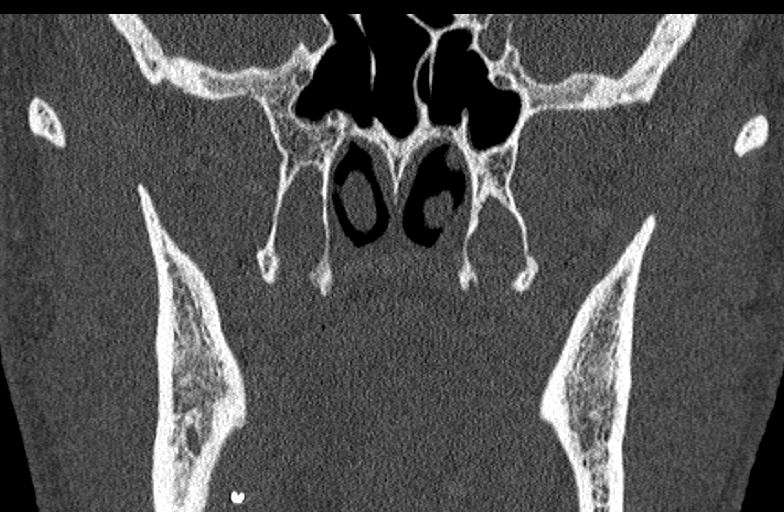
[im 178/297  bone]
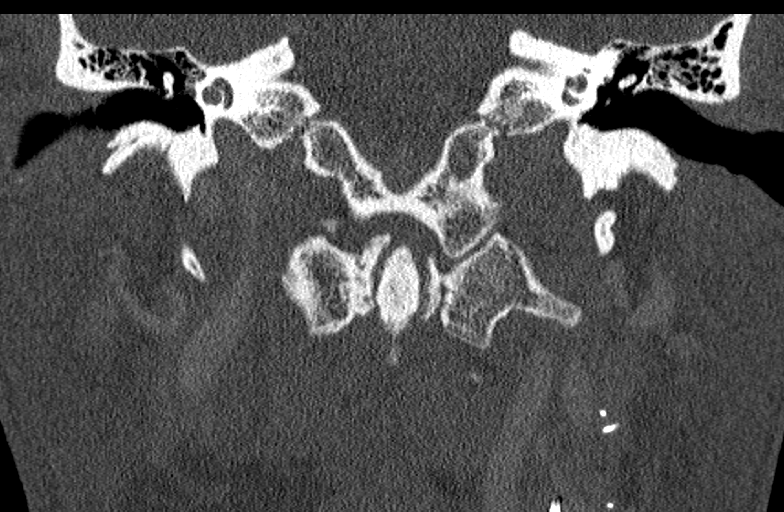

[Series 602: coronals left · sagittal · 0.25mm/px · 3 of 284 slices shown]
[im 95/284  bone]
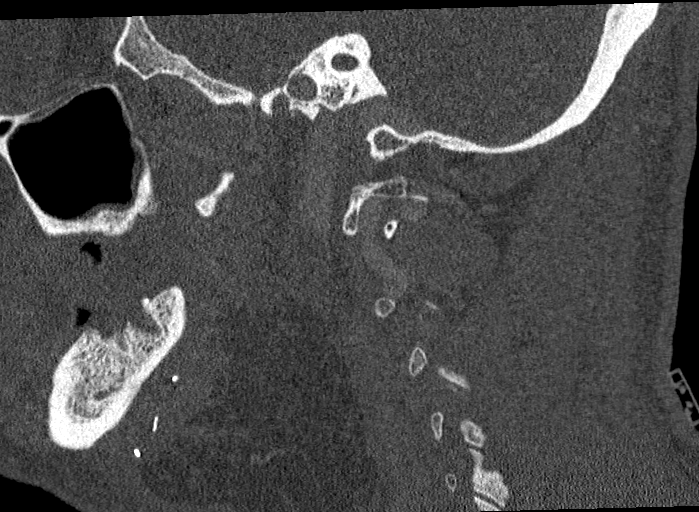
[im 142/284  bone]
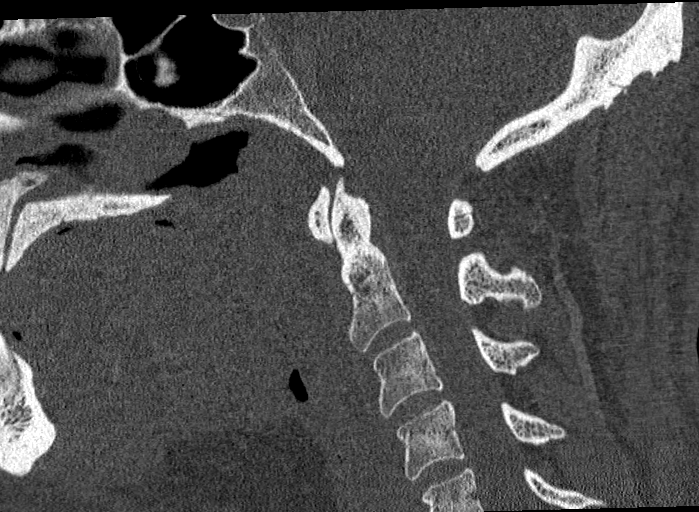
[im 189/284  bone]
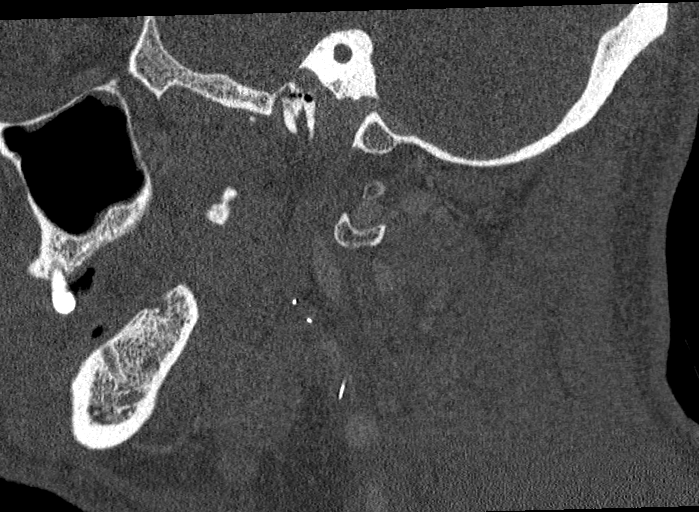

[13 of 47 positions shown; findings below may reference images not displayed]

RADIATION DOSE REDUCTION: This exam was performed according to the
departmental dose-optimization program which includes automated
exposure control, adjustment of the mA and/or kV according to
patient size and/or use of iterative reconstruction technique.

CONTRAST:  100mL OMNIPAQUE IOHEXOL 350 MG/ML SOLN
FINDINGS: RIGHT TEMPORAL BONE

External auditory canal: Normal.

Middle ear cavity: Normally aerated. The scutum and ossicles are
normal. The tegmen tympani is intact.

Inner ear structures: The cochlea, vestibule and semicircular canals
are normal. The vestibular aqueduct is not enlarged.

Internal auditory and facial nerve canals:  Normal

Mastoid air cells: Normally aerated. No osseous erosion.

LEFT TEMPORAL BONE

External auditory canal: Normal.

Middle ear cavity: Normally aerated. The scutum and ossicles are
normal. The tegmen tympani is intact.

Inner ear structures: The cochlea, vestibule and semicircular canals
are normal. The vestibular aqueduct is not enlarged.

Internal auditory and facial nerve canals:  Normal.

Mastoid air cells: Normally aerated. No osseous erosion.

Vascular: Normal non-contrast appearance of the carotid canals,
jugular bulbs and sigmoid plates.

Limited intracranial:  No acute or significant finding.

Visible orbits/paranasal sinuses: Mild paranasal sinus mucosal
thickening.

Soft tissues: Diffuse subcutaneous edema with ill-defined soft
tissue thickening inferior and posterior to the right temporal bone
in the upper neck. No discrete, drainable fluid collection
IMPRESSION: 1. Diffuse subcutaneous edema with ill-defined soft tissue
thickening inferior and posterior to the right temporal bone in the
upper neck. No discrete, drainable fluid collection. Findings could
be secondary to post treatment change and/or infection. Malignancy
is thought less likely, but findings warrant attention on follow-up
imaging given no immediate post treatment CT for comparison.
2. Unremarkable appearance of the temporal bones. No mastoid or
middle ear effusions.

## 2023-07-26 IMAGING — CT CT NECK W/ CM
3 of 7 series · 13 of 33 positions shown, 15 images · IV contrast (APPLIED)
Comparison: CT neck January 13, 2022.

CLINICAL DATA: Soft tissue swelling, infection suspected, neck xray
done patient with trach and soft tissue swelling of neck

EXAM:
CT NECK WITH CONTRAST
TECHNIQUE: Multidetector CT imaging of the neck was performed using the
standard protocol following the bolus administration of intravenous
contrast.

[Series 503: coronals left · sagittal · 0.25mm/px · 5 of 284 slices shown]
[im 63/284  bone]
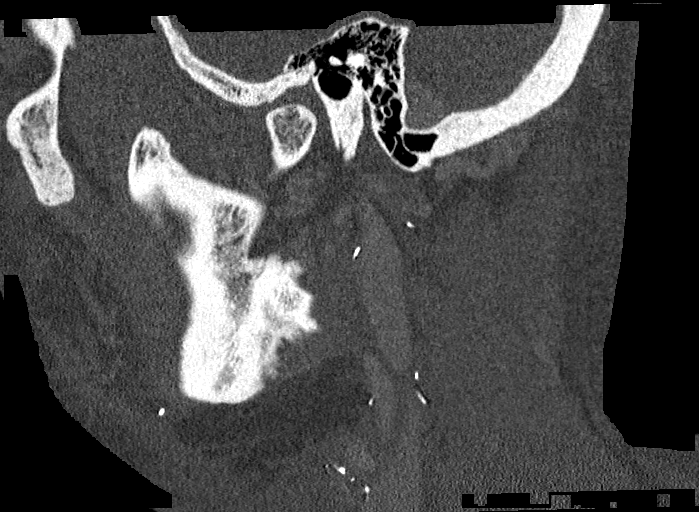
[im 95/284  bone]
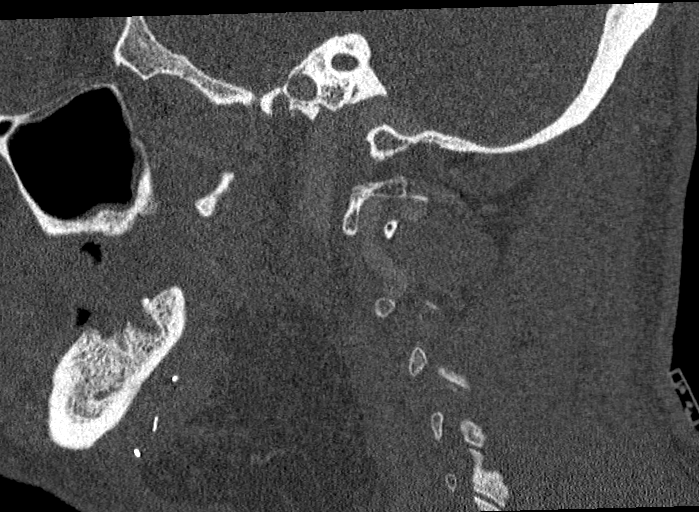
[im 126/284  bone]
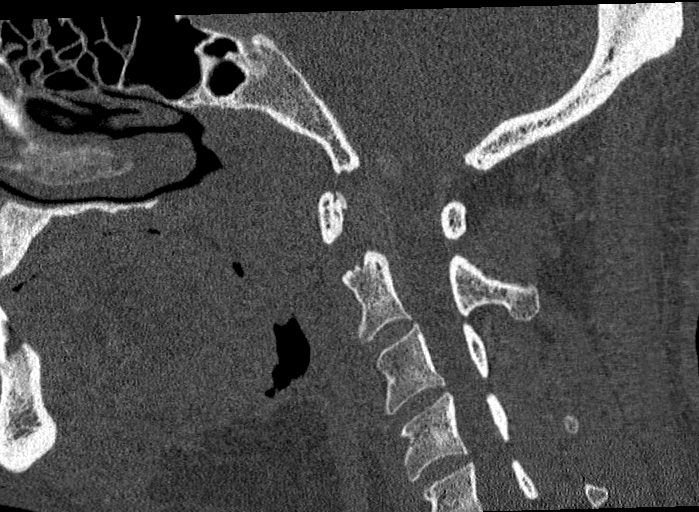
[im 158/284  bone]
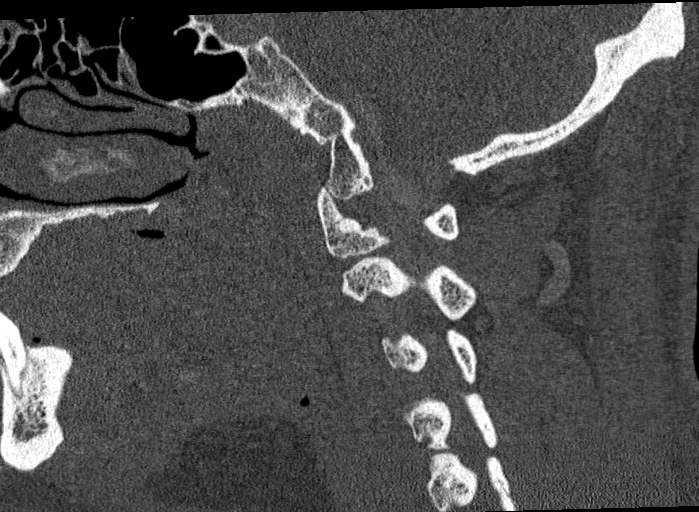
[im 189/284  bone]
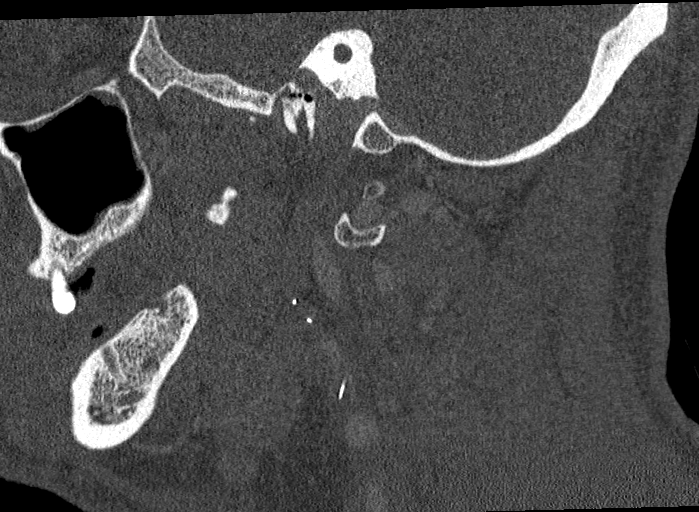

[Series 504: coronals right · coronal · 0.26mm/px · 3 of 344 slices shown]
[im 86/344  bone]
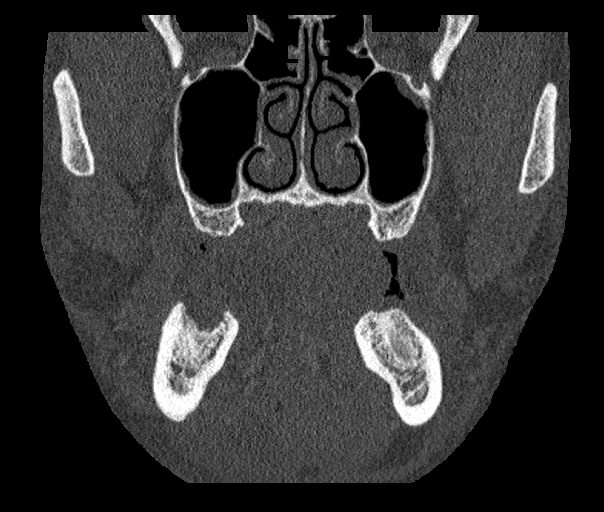
[im 172/344  bone]
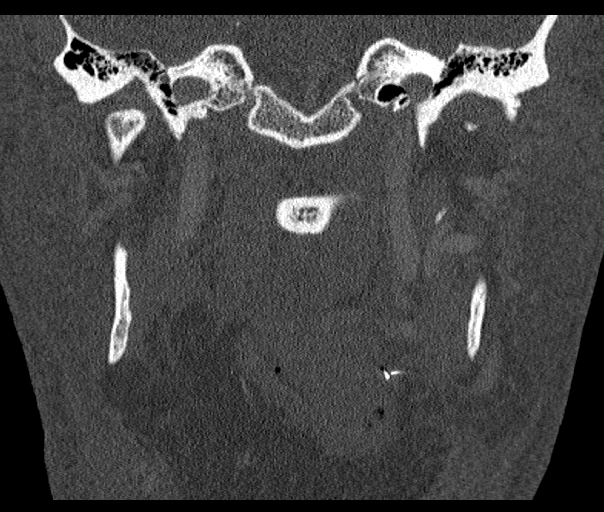
[im 258/344  bone]
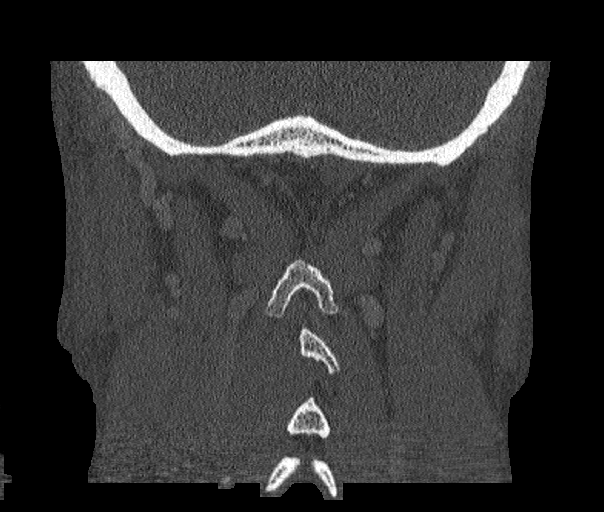

[Series 509: 1.0 thins · axial · 0.33mm/px · z∈[-252,-74]mm · 5 of 268 slices shown, 7 images]
[im 45/268  soft-tissue]
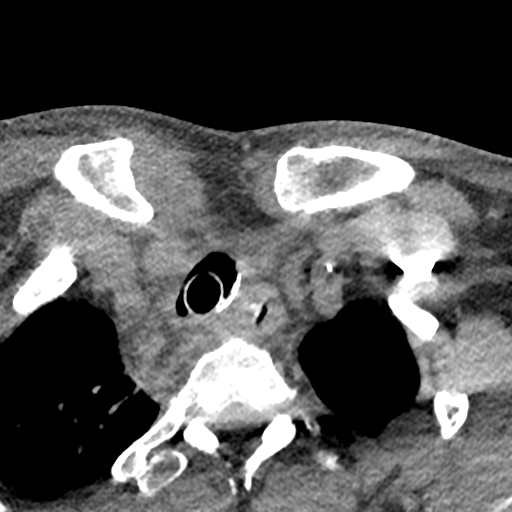
[im 45/268  bone]
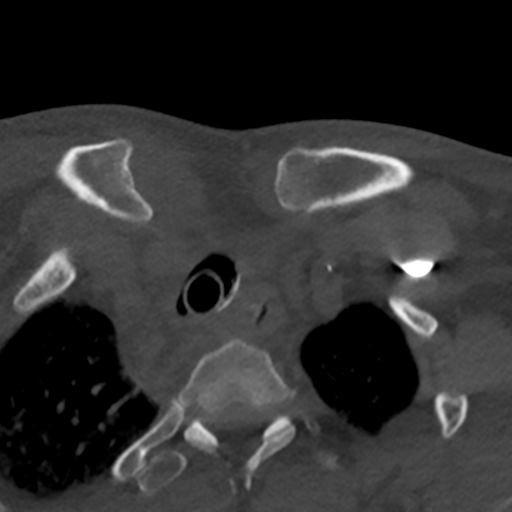
[im 90/268  bone]
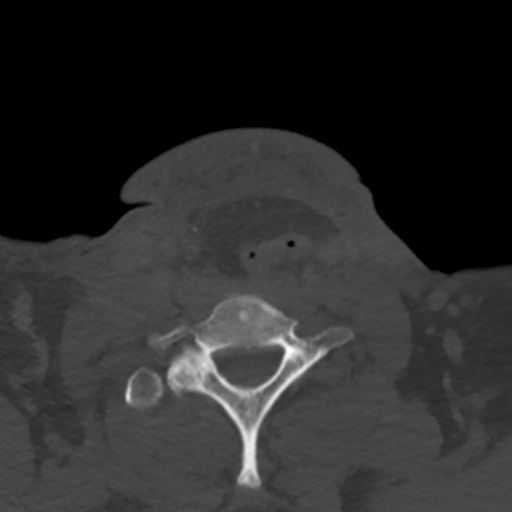
[im 134/268  bone]
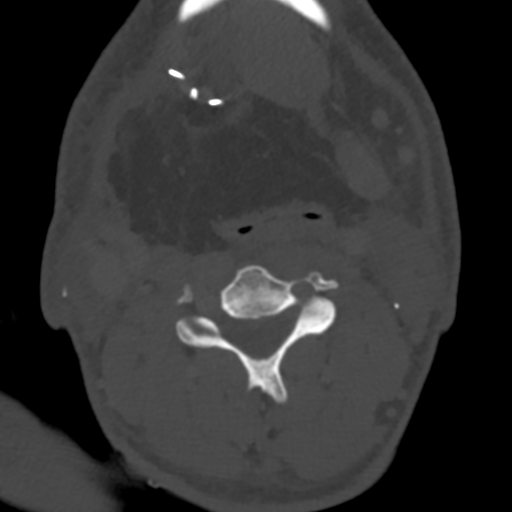
[im 179/268  bone]
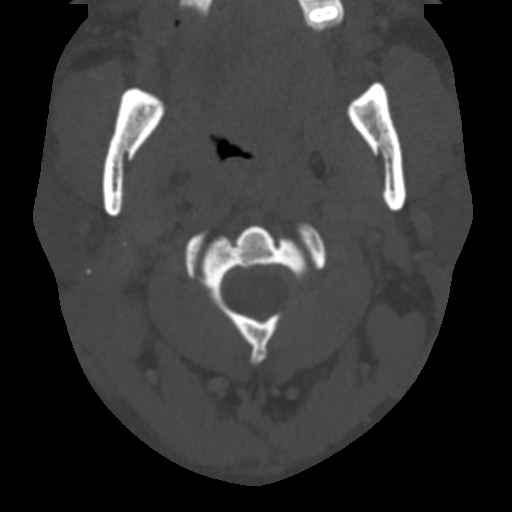
[im 223/268  soft-tissue]
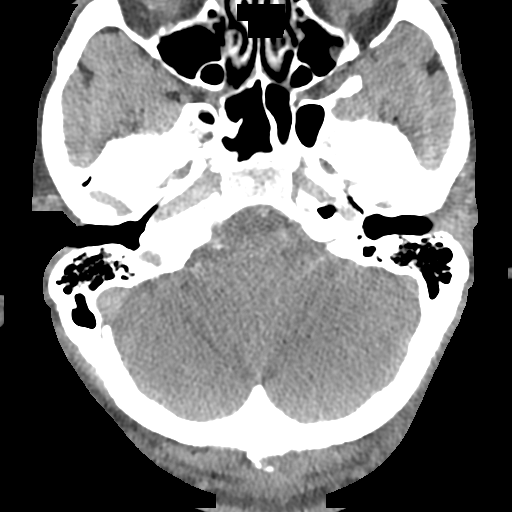
[im 223/268  bone]
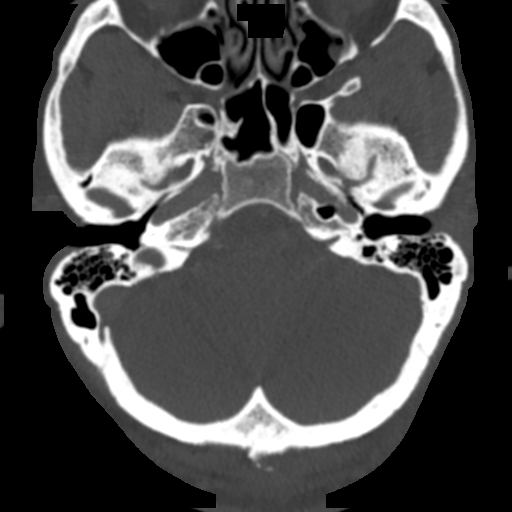

[13 of 33 positions shown; findings below may reference images not displayed]

RADIATION DOSE REDUCTION: This exam was performed according to the
departmental dose-optimization program which includes automated
exposure control, adjustment of the mA and/or kV according to
patient size and/or use of iterative reconstruction technique.

CONTRAST:  100mL OMNIPAQUE IOHEXOL 350 MG/ML SOLN
FINDINGS: Redemonstrated extensive postsurgical changes, including partial
pharyngectomy, total laryngectomy, thyroidectomy, and base of tongue
resection. Large flap within the region of resection. Similar
subcutaneous edema in the neck, small volume prevertebral edema, and
areas of soft tissue thickening along the left eccentric tongue and
oropharynx. No discrete, drainable fluid collection.

Bilateral nodal dissections in the neck without evidence of
pathologically enlarged lymph nodes

Resection of the right submandibular gland. Left submandibular gland
and parotid glands are unremarkable.

Tracheostomy tube is partially imaged in the lower trachea. Lies
lung apices are clear.

Temporal bones are further assessed on concurrent CT of the temporal
bones.

No evidence of obvious acute abnormality in the visualized brain.
Unremarkable orbits.
IMPRESSION: In comparison to CT of the neck from January 13, 2022, no substantial
change in extensive postsurgical change with subcutaneous edema in
the neck, small volume prevertebral edema, and areas of soft tissue
thickening along the left eccentric tongue and oropharynx. Findings
could represent post treatment change, but superimposed infection is
not excluded. No discrete, drainable fluid collection. No clearly
masslike or nodular soft tissue to suggest recurrence, but recommend
continued imaging follow-up and mucosal surveillance to assess for
malignancy given no immediate postoperative imaging for comparison.
